# Patient Record
Sex: Female | Born: 1935 | Race: White | Hispanic: No | Smoking: Never smoker
Health system: Southern US, Community
[De-identification: ages and names within clinical notes are randomized; demographics above are authoritative.]

## PROBLEM LIST (undated history)

## (undated) DIAGNOSIS — I2510 Atherosclerotic heart disease of native coronary artery without angina pectoris: Secondary | ICD-10-CM

## (undated) DIAGNOSIS — I509 Heart failure, unspecified: Secondary | ICD-10-CM

## (undated) DIAGNOSIS — J45909 Unspecified asthma, uncomplicated: Secondary | ICD-10-CM

## (undated) DIAGNOSIS — E7800 Pure hypercholesterolemia, unspecified: Secondary | ICD-10-CM

## (undated) DIAGNOSIS — I447 Left bundle-branch block, unspecified: Secondary | ICD-10-CM

## (undated) DIAGNOSIS — R9389 Abnormal findings on diagnostic imaging of other specified body structures: Secondary | ICD-10-CM

## (undated) DIAGNOSIS — Z8719 Personal history of other diseases of the digestive system: Secondary | ICD-10-CM

## (undated) DIAGNOSIS — K219 Gastro-esophageal reflux disease without esophagitis: Secondary | ICD-10-CM

## (undated) DIAGNOSIS — M545 Low back pain, unspecified: Secondary | ICD-10-CM

## (undated) DIAGNOSIS — M810 Age-related osteoporosis without current pathological fracture: Secondary | ICD-10-CM

## (undated) DIAGNOSIS — I10 Essential (primary) hypertension: Secondary | ICD-10-CM

## (undated) DIAGNOSIS — G8929 Other chronic pain: Secondary | ICD-10-CM

## (undated) DIAGNOSIS — M1990 Unspecified osteoarthritis, unspecified site: Secondary | ICD-10-CM

## (undated) DIAGNOSIS — E039 Hypothyroidism, unspecified: Secondary | ICD-10-CM

## (undated) DIAGNOSIS — I872 Venous insufficiency (chronic) (peripheral): Secondary | ICD-10-CM

## (undated) DIAGNOSIS — I5030 Unspecified diastolic (congestive) heart failure: Secondary | ICD-10-CM

## (undated) DIAGNOSIS — N189 Chronic kidney disease, unspecified: Secondary | ICD-10-CM

## (undated) DIAGNOSIS — K5790 Diverticulosis of intestine, part unspecified, without perforation or abscess without bleeding: Secondary | ICD-10-CM

## (undated) DIAGNOSIS — J449 Chronic obstructive pulmonary disease, unspecified: Secondary | ICD-10-CM

## (undated) DIAGNOSIS — D509 Iron deficiency anemia, unspecified: Secondary | ICD-10-CM

## (undated) HISTORY — PX: CHOLECYSTECTOMY: SHX55

## (undated) HISTORY — PX: HAMMER TOE SURGERY: SHX385

## (undated) HISTORY — PX: CYSTOSCOPY KIDNEY W/ URETERAL GUIDE WIRE: SUR371

## (undated) HISTORY — PX: BUNIONECTOMY: SHX129

## (undated) HISTORY — PX: CARPAL TUNNEL RELEASE: SHX101

## (undated) HISTORY — PX: BREAST CYST EXCISION: SHX579

## (undated) HISTORY — PX: CATARACT EXTRACTION W/ INTRAOCULAR LENS  IMPLANT, BILATERAL: SHX1307

## (undated) HISTORY — PX: ABDOMINAL HYSTERECTOMY: SHX81

## (undated) HISTORY — PX: TOE SURGERY: SHX1073

---

## 1998-11-25 HISTORY — PX: TOTAL KNEE ARTHROPLASTY: SHX125

## 2012-06-25 HISTORY — PX: CORONARY ANGIOPLASTY WITH STENT PLACEMENT: SHX49

## 2012-07-22 ENCOUNTER — Other Ambulatory Visit: Payer: Self-pay | Admitting: Physician Assistant

## 2012-07-22 ENCOUNTER — Inpatient Hospital Stay (HOSPITAL_COMMUNITY)
Admission: AD | Admit: 2012-07-22 | Discharge: 2012-07-24 | DRG: 246 | Disposition: A | Discharge status: Home / Self Care | Payer: Medicare Other | Source: Other Acute Inpatient Hospital | Attending: Cardiology | Admitting: Cardiology

## 2012-07-22 ENCOUNTER — Encounter (HOSPITAL_COMMUNITY): Payer: Self-pay | Admitting: Pharmacy Technician

## 2012-07-22 ENCOUNTER — Encounter (HOSPITAL_COMMUNITY): Payer: Self-pay | Admitting: General Practice

## 2012-07-22 DIAGNOSIS — E039 Hypothyroidism, unspecified: Secondary | ICD-10-CM | POA: Diagnosis present

## 2012-07-22 DIAGNOSIS — I200 Unstable angina: Secondary | ICD-10-CM

## 2012-07-22 DIAGNOSIS — I2510 Atherosclerotic heart disease of native coronary artery without angina pectoris: Principal | ICD-10-CM | POA: Diagnosis present

## 2012-07-22 DIAGNOSIS — K5730 Diverticulosis of large intestine without perforation or abscess without bleeding: Secondary | ICD-10-CM | POA: Diagnosis present

## 2012-07-22 DIAGNOSIS — Z955 Presence of coronary angioplasty implant and graft: Secondary | ICD-10-CM

## 2012-07-22 DIAGNOSIS — R079 Chest pain, unspecified: Secondary | ICD-10-CM

## 2012-07-22 DIAGNOSIS — I509 Heart failure, unspecified: Secondary | ICD-10-CM | POA: Diagnosis present

## 2012-07-22 DIAGNOSIS — R0602 Shortness of breath: Secondary | ICD-10-CM

## 2012-07-22 DIAGNOSIS — D649 Anemia, unspecified: Secondary | ICD-10-CM | POA: Diagnosis present

## 2012-07-22 DIAGNOSIS — I5031 Acute diastolic (congestive) heart failure: Secondary | ICD-10-CM | POA: Diagnosis present

## 2012-07-22 DIAGNOSIS — G2581 Restless legs syndrome: Secondary | ICD-10-CM | POA: Diagnosis present

## 2012-07-22 DIAGNOSIS — I10 Essential (primary) hypertension: Secondary | ICD-10-CM | POA: Diagnosis present

## 2012-07-22 HISTORY — DX: Gastro-esophageal reflux disease without esophagitis: K21.9

## 2012-07-22 HISTORY — DX: Diverticulosis of intestine, part unspecified, without perforation or abscess without bleeding: K57.90

## 2012-07-22 HISTORY — DX: Personal history of other diseases of the digestive system: Z87.19

## 2012-07-22 HISTORY — DX: Essential (primary) hypertension: I10

## 2012-07-22 HISTORY — DX: Atherosclerotic heart disease of native coronary artery without angina pectoris: I25.10

## 2012-07-22 HISTORY — DX: Hypothyroidism, unspecified: E03.9

## 2012-07-22 HISTORY — DX: Unspecified diastolic (congestive) heart failure: I50.30

## 2012-07-22 HISTORY — DX: Unspecified osteoarthritis, unspecified site: M19.90

## 2012-07-22 HISTORY — DX: Abnormal findings on diagnostic imaging of other specified body structures: R93.89

## 2012-07-22 LAB — HEPARIN LEVEL (UNFRACTIONATED): Heparin Unfractionated: 0.55 IU/mL (ref 0.30–0.70)

## 2012-07-22 MED ORDER — ASPIRIN EC 81 MG PO TBEC: 81.0000 mg | DELAYED_RELEASE_TABLET | Freq: Every day | ORAL | Status: DC

## 2012-07-22 MED ORDER — LOPERAMIDE HCL 1 MG/5ML PO LIQD
1.0000 mg | Freq: Every day | ORAL | Status: DC | PRN
  Filled 2012-07-22: qty 5

## 2012-07-22 MED ORDER — ASPIRIN EC 81 MG PO TBEC
81.0000 mg | DELAYED_RELEASE_TABLET | Freq: Every day | ORAL | Status: DC
  Filled 2012-07-22: qty 1

## 2012-07-22 MED ORDER — GABAPENTIN 100 MG PO CAPS
100.0000 mg | ORAL_CAPSULE | Freq: Every day | ORAL | Status: DC
  Administered 2012-07-22 – 2012-07-23 (×2): 100 mg via ORAL
  Filled 2012-07-22 (×3): qty 1

## 2012-07-22 MED ORDER — ASPIRIN 81 MG PO CHEW
324.0000 mg | CHEWABLE_TABLET | Freq: Once | ORAL | Status: AC
  Administered 2012-07-23: 324 mg via ORAL
  Filled 2012-07-22: qty 4

## 2012-07-22 MED ORDER — AMLODIPINE BESYLATE 5 MG PO TABS
5.0000 mg | ORAL_TABLET | Freq: Every day | ORAL | Status: DC
  Administered 2012-07-22: 5 mg via ORAL
  Filled 2012-07-22 (×3): qty 1

## 2012-07-22 MED ORDER — HYDROCODONE-ACETAMINOPHEN 7.5-325 MG PO TABS
1.0000 | ORAL_TABLET | Freq: Four times a day (QID) | ORAL | Status: DC | PRN
  Administered 2012-07-22 – 2012-07-23 (×2): 1 via ORAL
  Filled 2012-07-22 (×2): qty 1

## 2012-07-22 MED ORDER — FERROUS SULFATE 325 (65 FE) MG PO TABS
325.0000 mg | ORAL_TABLET | Freq: Every day | ORAL | Status: DC
  Administered 2012-07-22: 325 mg via ORAL
  Filled 2012-07-22 (×3): qty 1

## 2012-07-22 MED ORDER — SODIUM CHLORIDE 0.9 % IV SOLN: INTRAVENOUS | Status: DC

## 2012-07-22 MED ORDER — ONDANSETRON HCL 4 MG/2ML IJ SOLN: 4.0000 mg | Freq: Four times a day (QID) | INTRAMUSCULAR | Status: DC | PRN

## 2012-07-22 MED ORDER — HEPARIN BOLUS VIA INFUSION
4000.0000 [IU] | Freq: Once | INTRAVENOUS | Status: AC
  Administered 2012-07-22: 4000 [IU] via INTRAVENOUS
  Filled 2012-07-22: qty 4000

## 2012-07-22 MED ORDER — BENAZEPRIL HCL 20 MG PO TABS
20.0000 mg | ORAL_TABLET | Freq: Every day | ORAL | Status: DC
  Administered 2012-07-22: 20 mg via ORAL
  Filled 2012-07-22 (×3): qty 1

## 2012-07-22 MED ORDER — NITROGLYCERIN 2 % TD OINT
1.0000 [in_us] | TOPICAL_OINTMENT | Freq: Four times a day (QID) | TRANSDERMAL | Status: DC
  Administered 2012-07-22 – 2012-07-23 (×2): 1 [in_us] via TOPICAL
  Filled 2012-07-22: qty 30

## 2012-07-22 MED ORDER — VITAMIN B-12 1000 MCG PO TABS
1000.0000 ug | ORAL_TABLET | Freq: Every day | ORAL | Status: DC
  Administered 2012-07-22: 1000 ug via ORAL
  Filled 2012-07-22 (×3): qty 1

## 2012-07-22 MED ORDER — ACETAMINOPHEN 325 MG PO TABS: 650.0000 mg | ORAL_TABLET | ORAL | Status: DC | PRN

## 2012-07-22 MED ORDER — HEPARIN (PORCINE) IN NACL 100-0.45 UNIT/ML-% IJ SOLN
800.0000 [IU]/h | INTRAMUSCULAR | Status: DC
  Administered 2012-07-22: 800 [IU]/h via INTRAVENOUS
  Filled 2012-07-22 (×2): qty 250

## 2012-07-22 MED ORDER — LEVOTHYROXINE SODIUM 75 MCG PO TABS
75.0000 ug | ORAL_TABLET | Freq: Every day | ORAL | Status: DC
  Administered 2012-07-22: 75 ug via ORAL
  Filled 2012-07-22 (×3): qty 1

## 2012-07-22 MED ORDER — NITROGLYCERIN 0.4 MG SL SUBL: 0.4000 mg | SUBLINGUAL_TABLET | SUBLINGUAL | Status: DC | PRN

## 2012-07-22 MED ORDER — CYCLOSPORINE 0.05 % OP EMUL
1.0000 [drp] | Freq: Two times a day (BID) | OPHTHALMIC | Status: DC
  Administered 2012-07-22 – 2012-07-23 (×3): 1 [drp] via OPHTHALMIC
  Filled 2012-07-22 (×6): qty 1

## 2012-07-22 MED ORDER — ROPINIROLE HCL 1 MG PO TABS
2.0000 mg | ORAL_TABLET | Freq: Every day | ORAL | Status: DC
  Administered 2012-07-22 – 2012-07-23 (×2): 2 mg via ORAL
  Filled 2012-07-22 (×3): qty 2

## 2012-07-22 MED ORDER — PANTOPRAZOLE SODIUM 40 MG PO TBEC
40.0000 mg | DELAYED_RELEASE_TABLET | Freq: Every day | ORAL | Status: DC
  Administered 2012-07-23: 40 mg via ORAL
  Filled 2012-07-22: qty 1

## 2012-07-22 MED ORDER — SODIUM CHLORIDE 0.9 % IV SOLN
INTRAVENOUS | Status: DC
  Administered 2012-07-22: 14:00:00 via INTRAVENOUS

## 2012-07-22 MED ORDER — PSYLLIUM 95 % PO PACK
1.0000 | PACK | Freq: Two times a day (BID) | ORAL | Status: DC
  Administered 2012-07-22 – 2012-07-23 (×2): 1 via ORAL
  Filled 2012-07-22 (×6): qty 1

## 2012-07-22 NOTE — Consult Note (Signed)
ANTICOAGULATION CONSULT NOTE - Follow up  Pharmacy Consult for Heparin Indication: chest pain/ACS  Allergies  Allergen Reactions  . Sulfa Antibiotics   . Sulfa Drugs Cross Reactors     Patient Measurements: Height: 5\' 3"  (160 cm) Weight: 148 lb 14.4 oz (67.541 kg) IBW/kg (Calculated) : 52.4  Heparin Dosing Weight: 66kg  Labs (from [facility]) : sCr 1.06 Hgb 13.4 Plt 227  Assessment: 85yof with hx HTN/HLD presented to [facility] with exertional CP and dyspnea. She was started on a heparin drip around 1200 at 880 units/hr and then transferred to [facility] for cardiac cath. Heparin drip was actually clamped off when she got to [facility]. Since it has been off for about an hour, will need to re-dose. Plan is for cath either this afternoon or tomorrow. Labs from [place] are wnl. Initial heparin level therapeutic @0 .55 units/ml.  Goal of Therapy:  Heparin level 0.3-0.7 units/ml Monitor platelets by anticoagulation protocol: Yes   Plan:  1)  Continue Heparin gtt @ 800 units/hr 2) Daily heparin level and CBC  Ana Pena 07/22/2012,11:45 PM

## 2012-07-22 NOTE — CV Procedure (Signed)
Conclusions: 1. The estimated ejection fraction is 60-65%. 2. Abnormal left ventricular diastolic filling is observed, consistent with impaired relaxation. 3. The left atrial chamber size is normal. 4. The right ventricular global systolic function is normal. 5. There is a trace of aortic regurgitation. 6. There is no evidence of mitral regurgitation. 7. There is a trace tricuspid regurgitation. 8. The inferior vena cava appears normal in size. 9. There is a greater than 50% respiratory change in the inferior vena cava dimension.  Alvin Critchley Gent 1:25 PM 07/22/2012

## 2012-07-22 NOTE — Consult Note (Signed)
ANTICOAGULATION CONSULT NOTE - Initial Consult  Pharmacy Consult for Heparin Indication: chest pain/ACS  Allergies  Allergen Reactions  . Sulfa Antibiotics   . Sulfa Drugs Cross Reactors     Patient Measurements: Height: 5\' 3"  (160 cm) Weight: 148 lb 14.4 oz (67.541 kg) IBW/kg (Calculated) : 52.4  Heparin Dosing Weight: 66kg  Labs (from [facility]) : sCr 1.06 Hgb 13.4 Plt 227  Assessment: 85yof with hx HTN/HLD presented to [facility] with exertional CP and dyspnea. She was started on a heparin drip around 1200 at 880 units/hr and then transferred to [facility] for cardiac cath. Heparin drip was actually clamped off when she got to [facility]. Since it has been off for about an hour, will need to re-dose. Plan is for cath either this afternoon or tomorrow. Labs from [place] are wnl.  Goal of Therapy:  Heparin level 0.3-0.7 units/ml Monitor platelets by anticoagulation protocol: Yes   Plan:  1) Heparin bolus 4000 units x 1 2) Heparin gtt @ 800 units/hr 3) 8h heparin level vs follow up after cath 4) Daily heparin level and CBC  Fredrik Rigger 07/22/2012,1:56 PM

## 2012-07-22 NOTE — Progress Notes (Signed)
Late entry. Earlier (~6pm) received word that patient's cath was deferred until tomorrow due to scheduling. I discussed the change with the patient at that time and offered our apologies for rescheduling the procedure. She was appreciative of the update. She was informed that her case is tentatively rescheduled for 7:30 tomorrow morning. She did not have any questions about the cath. Discussed plan with Shanda Bumps, RN - changed to NSL with plans to resume IVF in AM, gave diet tray tonight and keep NPO after midnight.  Dayna Dunn PA-C

## 2012-07-22 NOTE — H&P (Signed)
NAME:  Ana Pena, Ana Pena ROOM: 228  UNIT NUMBER:  [id number] LOCATION: 12F 228 01 ADM/VISIT DATE:  07/21/2012   ADM Ana BrownerKathaleen Grinder:  [id number] DOB: [birth year]   PRIMARY CARE PHYSICIAN:  Doreen Beam, M.D.  CURRENT COMPLAINTS:  Chest pain and shortness of breath on exertion.  HISTORY OF PRESENT ILLNESS:  The patient is a very pleasant 78 year old female with no significant past medical history, short of treated hypertension.  The patient was in her usual state of health until approximately 2 weeks ago.  According to the daughter and her husband, who is my patient, she has been always very active and has had no limitations in her activities or exercise tolerance.  However, 2 weeks ago she started developing episodes of substernal chest pressure with some radiation into the neck, quickly followed by shortness of breath.  The episodes always occurred on exertion, but appeared to become more frequent over the last 2 weeks.  She stated that walking up a flight of stairs at her home causes chest pressure associated with shortness of breath.  She then has to sit down and rest and her symptoms disappear.  She has not tried any nitroglycerin.  Again according to the daughter, these symptoms have become quite frequent, and they have noticed that during activities of daily living the patient has to stop frequently, sit down and rest.  She also reports symptoms of orthopnea and PND over the last 2 weeks.  She has had difficulty sleeping and has to prop herself up on several pillows.  In addition, also allows noticed ankle edema during this period.  She was seen by Dr. Sherril Croon for evaluation of her symptoms, who admitted her to the hospital.  She has ruled out for myocardial infarction in the interim, and her EKG on admission showed no acute ischemic changes or prior infarct pattern.  She did have a left anterior hemiblock.  Troponins have remained negative.  She did have a positive D-dimer at 0.06, and a CT scan was performed  which showed no evidence of pulmonary embolism.  There was some question of a cyst in the kidney.  There was also cardiomegaly noted on the CT scan.  After admission the patient received Lasix and she reported that her breathing is much improved, and her lower extremity edema has resolved.  Notable on admission, her BNP level was 69, but this was already after she received Lasix.  ALLERGIES:  SULFA.  But no known IVP dye reaction; patient tolerated the CTA without any difficulty.  SOCIAL HISTORY:  The patient lives with her husband, who is my patient.  She does not smoke or drink.  She is retired.  FAMILY HISTORY:  Noncontributory.  HOME MEDICATIONS: 1. Diclofenac 50 mg twice daily. 2. Fosamax 70 mg weekly. 3. Imodium 1 mg daily. 4. Levothyroxine 75 mcg p.o. daily. 5. Lorcet 7.5/650 one tablet q.6h. as needed. 6. Lotrel 5/20 mg daily. 7. Metamucil, ispaghula husk, 2 tablets orally twice daily. 8. Neurontin 100 mg p.o. daily at bedtime. 9. Protonix 40 mg p.o. daily. 10. Requip 0.5 mg at bedtime. 11. Restasis eyedrops 0.05% two drops daily. 12. Slow Iron release 45 mg. 13. Ferrous sulfate 325 mg daily. 14. Vitamin B12 1000 mcg orally daily.  PAST MEDICAL HISTORY: 1. Hypertension. 2. Hypothyroidism. 3. Restless leg syndrome. 4. Constipation.  REVIEW OF SYSTEMS:  Pertinent positives as listed below.  Patient reports constipation.  Positive for lower extremity edema.  No nausea or vomiting.  No fever or  chills.  No claudication.  The remainder of the 18-point review of systems is otherwise within normal limits.  PHYSICAL EXAMINATION:  Vital signs:  Blood pressure 151/73, heart rate 74, respirations 20, temperature 98, saturation 95%.  General:  A well-nourished white female in no apparent distress.  HEENT:  Pupils are isochoric, conjunctivae clear.  Oropharynx is clear, no evidence of gingivitis.  Normal carotid upstroke and no carotid bruits.  No thyromegaly, no nodular thyroid.  JVP is  approximately 6 cm in a 45 degree angle.  Lungs:  Clear breath sounds bilaterally without any wheezing or crackles.  Heart:  Regular rate and rhythm with normal S1, S2 and no pathological murmurs.  No pathological S3.  Abdomen:  Soft, nontender with no rebound or guarding, and good bowel sounds.  There is no hepatomegaly on palpation.  Urogenital:  Exam was deferred.  Extremities:  Exam reveals no edema.  Skin is somewhat dry and tented.  There is no cyanosis or clubbing.  Vascular:  There are 2+ femoral pulses bilaterally with no bruits.  Normal dorsalis pedis and posterior tibial pulses bilaterally.  Neurologic:  The patient is alert and oriented and grossly nonfocal.  Psychologic:  Normal affect.  LABORATORY WORK:  CBC is essentially within normal limits with a hemoglobin of 13.4, MCV 91.3, platelet count 227,000 and white count of 6500.  BMET is notable for a creatinine of 1.06, BUN 21 and potassium of 3.8.  Sodium is 139, CO2 is 24.  Liver function tests are within normal limits.  BNP level was normal, as outlined above.  Two sets of troponins are less than 2.01 respectively.  PROBLEM LIST: 1. Unstable angina. a. Rule out ischemic heart disease (likely). b. Ruled out for pulmonary embolism.  Negative CT scan. 2. Congestive heart failure symptoms. a. Systolic versus diastolic dysfunction, suspect the latter. b. Cardiomegaly by CT scan. c. Two-D echocardiogram with diastolic parameters performed in [place], and results pending. 3. Hypertension.  Controlled. 4. Hypothyroidism.  PLAN: 1. The patient presents with symptoms consistent with acute coronary syndrome.  She has exertional chest pain and dyspnea.  I suspect she has coronary artery disease, and I have given the patient the option to treat her medically versus an immediate invasive workup.  After talking with her husband and her daughter, as well as the patient, they feel they want to proceed with diagnostic cardiac catheterization to rule out  ischemic heart disease. 2. The patient also presented with heart failure symptoms, but this may well be related to her ischemic heart disease.  An echocardiogram is required to rule out diastolic dysfunction versus systolic dysfunction.  I could not auscultate any significant valvular lesions, but further evaluation of the echocardiogram is also indicated. 3. I discussed the risks and benefits of a cardiac catheterization with the patient and she is willing to proceed.  We will continue her home medications, but will hold Diclofenac, and we will also start her on nitro paste and intravenous heparin in the meanwhile.  It is not entirely clear if she will have a cardiac catheterization this afternoon, but she will be kept n.p.o. for lunch.  If her catheterization is tomorrow then the patient can have a diet today.  [facility] has been notified as well as [facility], and the patient will be transferred in the next few hours.   __________________________    Lewayne Bunting, M.D. Alinda Money D: 07/22/2012 1012 T: 07/22/2012 1048 P: DEG  cc:  Doreen Beam, M.D.  Alvin Critchley [facility] 07/22/2012  1:25 PM

## 2012-07-23 ENCOUNTER — Ambulatory Visit (HOSPITAL_COMMUNITY): Admit: 2012-07-23 | Payer: Medicare Other | Admitting: Cardiovascular Disease

## 2012-07-23 ENCOUNTER — Encounter (HOSPITAL_COMMUNITY)
Admission: AD | Disposition: A | Discharge status: Home / Self Care | Payer: Self-pay | Source: Other Acute Inpatient Hospital | Attending: Cardiology

## 2012-07-23 DIAGNOSIS — I200 Unstable angina: Secondary | ICD-10-CM

## 2012-07-23 DIAGNOSIS — I2510 Atherosclerotic heart disease of native coronary artery without angina pectoris: Secondary | ICD-10-CM

## 2012-07-23 HISTORY — PX: PERCUTANEOUS CORONARY STENT INTERVENTION (PCI-S): SHX5485

## 2012-07-23 HISTORY — PX: LEFT HEART CATHETERIZATION WITH CORONARY ANGIOGRAM: SHX5451

## 2012-07-23 LAB — BASIC METABOLIC PANEL
CO2: 26 mEq/L (ref 19–32)
Chloride: 100 mEq/L (ref 96–112)
Glucose, Bld: 115 mg/dL — ABNORMAL HIGH (ref 70–99)
Sodium: 136 mEq/L (ref 135–145)

## 2012-07-23 LAB — CBC
HCT: 35.1 % — ABNORMAL LOW (ref 36.0–46.0)
Hemoglobin: 11.7 g/dL — ABNORMAL LOW (ref 12.0–15.0)
MCH: 29.5 pg (ref 26.0–34.0)
MCV: 88.4 fL (ref 78.0–100.0)
RBC: 3.97 MIL/uL (ref 3.87–5.11)
WBC: 7.1 10*3/uL (ref 4.0–10.5)

## 2012-07-23 LAB — PROTIME-INR: INR: 1.07 (ref 0.00–1.49)

## 2012-07-23 SURGERY — LEFT HEART CATHETERIZATION WITH CORONARY ANGIOGRAM
Anesthesia: LOCAL

## 2012-07-23 MED ORDER — ATORVASTATIN CALCIUM 20 MG PO TABS
20.0000 mg | ORAL_TABLET | Freq: Every day | ORAL | Status: DC
  Administered 2012-07-23: 20 mg via ORAL
  Filled 2012-07-23 (×2): qty 1

## 2012-07-23 MED ORDER — NITROGLYCERIN 0.2 MG/ML ON CALL CATH LAB
INTRAVENOUS | Status: AC
  Filled 2012-07-23: qty 1

## 2012-07-23 MED ORDER — CLOPIDOGREL BISULFATE 75 MG PO TABS
75.0000 mg | ORAL_TABLET | Freq: Every day | ORAL | Status: DC
  Filled 2012-07-23: qty 1

## 2012-07-23 MED ORDER — SODIUM CHLORIDE 0.9 % IV SOLN: INTRAVENOUS | Status: AC

## 2012-07-23 MED ORDER — FENTANYL CITRATE 0.05 MG/ML IJ SOLN
INTRAMUSCULAR | Status: AC
  Filled 2012-07-23: qty 2

## 2012-07-23 MED ORDER — LIDOCAINE HCL (PF) 1 % IJ SOLN
INTRAMUSCULAR | Status: AC
Start: 2012-07-23 — End: 2012-07-23
  Filled 2012-07-23: qty 30

## 2012-07-23 MED ORDER — ADENOSINE 12 MG/4ML IV SOLN
12.0000 mL | Freq: Once | INTRAVENOUS | Status: DC
  Filled 2012-07-23: qty 12

## 2012-07-23 MED ORDER — HEPARIN (PORCINE) IN NACL 2-0.9 UNIT/ML-% IJ SOLN
INTRAMUSCULAR | Status: AC
  Filled 2012-07-23: qty 2000

## 2012-07-23 MED ORDER — BIVALIRUDIN 250 MG IV SOLR
INTRAVENOUS | Status: AC
  Filled 2012-07-23: qty 250

## 2012-07-23 MED ORDER — CLOPIDOGREL BISULFATE 300 MG PO TABS
ORAL_TABLET | ORAL | Status: AC
  Filled 2012-07-23: qty 2

## 2012-07-23 MED ORDER — MIDAZOLAM HCL 2 MG/2ML IJ SOLN
INTRAMUSCULAR | Status: AC
  Filled 2012-07-23: qty 2

## 2012-07-23 MED ORDER — FAMOTIDINE IN NACL 20-0.9 MG/50ML-% IV SOLN
INTRAVENOUS | Status: AC
  Filled 2012-07-23: qty 50

## 2012-07-23 MED FILL — Heparin Sodium (Porcine) 100 Unt/ML in Sodium Chloride 0.45%: INTRAMUSCULAR | Qty: 250 | Status: AC

## 2012-07-23 NOTE — Interval H&P Note (Signed)
History and Physical Interval Note:  07/23/2012 8:30 AM  Ana Pena  has presented today for cardiac cath  with the diagnosis of Chest pain  The various methods of treatment have been discussed with the patient and family. After consideration of risks, benefits and other options for treatment, the patient has consented to  Procedure(s) (LRB): LEFT HEART CATHETERIZATION WITH CORONARY ANGIOGRAM (N/A) as a surgical intervention .  The patient's history has been reviewed, patient examined, no change in status, stable for surgery.  I have reviewed the patient's chart and labs.  Questions were answered to the patient's satisfaction.     MCALHANY,CHRISTOPHER

## 2012-07-23 NOTE — CV Procedure (Signed)
   Cardiac Catheterization Operative Report  Mada Sadik [id number] 8/29/20139:18 AM VYAS,DHRUV B., MD  Procedure Performed:  1. Left Heart Catheterization 2. Selective Coronary Angiography 3. FFR LAD 4. PTCA/DES x 1 mid LAD 5. Angioseal RFA  Operator: Verne Carrow, MD  Indication: Unstable angina.                                       Procedure Details: The risks, benefits, complications, treatment options, and expected outcomes were discussed with the patient. The patient and/or family concurred with the proposed plan, giving informed consent. The patient was brought to the cath lab after IV hydration was begun and oral premedication was given. The patient was further sedated with Versed and Fentanyl. The right groin was prepped and draped in the usual manner. Using the modified Seldinger access technique, a 5 French sheath was placed in the right femoral artery. Standard diagnostic catheters were used to perform selective coronary angiography. A pigtail catheter was used to measure LV pressures. The patient was found to have a 70-80% mid LAD stenosis. I elected to perform a FFR. I upsized the sheath to a 6 [place] system. I then engaged the left main with a XB LAD 3.5 guiding catheter. She was given a bolus of Angiomax and a drip was started. When the ACT was greater than 200, I passed a pressure wire into the mid LAD. IV adenosine was started. Baseline FFR 0.90. FFR with infusion of adenosine was 0.74. I then used a 2.5 x 15 mm balloon to pre-dilate the lesion in the proximal to mid LAD x 2. I then deployed a 2.75 x 28 mm Promus Element DES in the proximal to mid LAD. This was post-dilated with a 2.75 x 20 mm Repton balloon x 2. There was an excellent result. The stenosis was taken from 80% down to 0%. An Angioseal was placed in the right femoral artery.   There were no immediate complications. The patient was taken to the recovery area in stable condition.   Hemodynamic  Findings: Central aortic pressure: 148/69 Left ventricular pressure: 143/5/9  Angiographic Findings:  Left main: No obstructive disease.   Left Anterior Descending Artery: Large caliber vessel that courses to the apex. The mid vessel has a 80% short stenosis with 50% plaque just beyond the tight stenosis. The diagonal is patent. The mid to distal vessel has another focal 50% smooth stenosis.   Circumflex Artery: Moderate sized vessel with proximal plaque, 40% smooth stenosis in the mid vessel. The first OM is small and patent.   Right Coronary Artery: Large, dominant vessel with mild plaque disease.   Left Ventricular Angiogram: Deferred.   Impression: 1. 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   Recommendations:  She will need ASA and Plavix for one year. Statin/beta blocker. D/C home in am.        Complications:  None. The patient tolerated the procedure well.

## 2012-07-23 NOTE — Progress Notes (Signed)
Received verbal order from Dr. Charm Barges for pt to travel off of telemetry to cath lab.

## 2012-07-24 ENCOUNTER — Encounter (HOSPITAL_COMMUNITY): Payer: Self-pay | Admitting: Physician Assistant

## 2012-07-24 LAB — BASIC METABOLIC PANEL
BUN: 21 mg/dL (ref 6–23)
CO2: 25 mEq/L (ref 19–32)
Calcium: 8.9 mg/dL (ref 8.4–10.5)
Glucose, Bld: 100 mg/dL — ABNORMAL HIGH (ref 70–99)
Sodium: 137 mEq/L (ref 135–145)

## 2012-07-24 LAB — CBC
HCT: 32.8 % — ABNORMAL LOW (ref 36.0–46.0)
Hemoglobin: 11 g/dL — ABNORMAL LOW (ref 12.0–15.0)
MCH: 30.1 pg (ref 26.0–34.0)
RBC: 3.66 MIL/uL — ABNORMAL LOW (ref 3.87–5.11)

## 2012-07-24 MED ORDER — CLOPIDOGREL BISULFATE 75 MG PO TABS: 75.0000 mg | ORAL_TABLET | Freq: Every day | ORAL | Status: DC

## 2012-07-24 MED ORDER — LOPERAMIDE HCL 2 MG PO TABS: 1.0000 mg | ORAL_TABLET | Freq: Every day | ORAL | Status: DC | PRN

## 2012-07-24 MED ORDER — NITROGLYCERIN 0.4 MG SL SUBL: 0.4000 mg | SUBLINGUAL_TABLET | SUBLINGUAL | Status: DC | PRN

## 2012-07-24 MED ORDER — ATORVASTATIN CALCIUM 20 MG PO TABS: 20.0000 mg | ORAL_TABLET | Freq: Every day | ORAL | Status: DC

## 2012-07-24 MED ORDER — ASPIRIN 81 MG PO TBEC: 81.0000 mg | DELAYED_RELEASE_TABLET | Freq: Every day | ORAL | Status: AC

## 2012-07-24 MED FILL — Dextrose Inj 5%: INTRAVENOUS | Qty: 50 | Status: AC

## 2012-07-24 NOTE — Progress Notes (Signed)
CARDIAC REHAB PHASE I   PRE:  Rate/Rhythm: 66 SR  BP:  Supine:   Sitting: 175/71  Standing:    SaO2:   MODE:  Ambulation: 1000 ft   POST:  Rate/Rhythem: 102 ST  BP:  Supine:   Sitting: 149/73  Standing:    SaO2:  0800-0900 Tolerated ambulation well without c/o of cp.Gait steady. BP improved after walk. Completed discharge education with pt, husband and son. Pt agrees to Outpt CRP in [place], will send referral.  Ana Pena

## 2012-07-24 NOTE — Discharge Summary (Signed)
Discharge Summary   Patient ID: Ana Pena MRN: [id number], DOB/AGE: 1926/09/25 78 y.o. Admit date: 07/22/2012 D/C date:     07/24/2012  Primary Cardiologist: DeGent  Primary Discharge Diagnoses:  1. Unstable angina with newly diagnosed CAD s/p PTCA/DES to LAD 07/23/12 - initiated on statin thus may need OP f/u labs 2. Acute diastolic CHF 3. HTN 4. Hypothyroidism 5. Question of possible renal cyst in RUQ by CT scan this admission - will be instructed to f/u PCP 6. Renal insufficiency by labs this admission - Cr 1.27 7. Mild anemia by labs this admission - Hgb 11.0 at discharge (hx of anemia on iron)  Secondary Discharge Diagnoses:  1. Restless leg syndrome 2. GERD 3. H/o hiatal hernia 4. Arthritis 5. Diverticulosis  Hospital Course: Ana Pena is an 78 y/o F with hx of HTN, hypothyroidism without prior history of CAD. She presented to [facility] complaining of substernal chest pressure with radiation to her neck fololwed by shortness of breath, more frequent over the last 2 weeks. The episodes have occurred with exertion and have been relieved with rest, progressing to the point where ADLs fatigue her. She also reported orthopnea and PND for the last 2 weeks. She did have a positive D-dimer at 0.06, and a CT scan was performed which showed no evidence of pulmonary embolism. There was some question of a cyst in the kidney on CT scan for which we discussed that she should follow up with PCP for further evaluation. There was also cardiomegaly noted on the CT scan. After admission the patient received Lasix with improvement in LEE and SOB. Troponins were negative at [facility]. EKG showed no acute ischemic changes or prior infarct pattern, did have a left anterior hemiblock. Her symptoms were felt concerning for unstable angina and she was transferred to [facility] for cardiac catheterization. 2D echo was performed prior to transfer at [facility] showing EF 60-65%, tr AI/TR,  abnormal LV diastolic filling consistent with impaired relaxation. She underwent cath 07/23/12 demonstrating double vessel CAD with severe stenosis in the LAD s/p PTCA/DES x 1. She had otherwise nonobstructive disease in the mid-distal LAD and Cx for med rx. She will need ASA/Plavix for 1 year. The patient tolerated the procedure well. Initially plan was to add BB but per discussion with Dr. Riley Kill, will hold off. HR is generally in the mid 60s. She ambulated with cardiac rehab without any difficulty. Dr. Riley Kill has seen and examined the patient and feels she is stable for discharge.  She was instructed to follow up with her primary care doctor for a few issues noted at discharge: 1) question of renal cyst on CT scan, 2) mild renal insufficiency (Cr 1.27), anemia (Hgb 11 at discharge). I discussed these results with her and her family.  Discharge Vitals: Blood pressure 175/71, pulse 71, temperature 97.5 F (36.4 C), temperature source Oral, resp. rate 15, height 5\' 3"  (1.6 m), weight 148 lb 14.4 oz (67.541 kg), SpO2 98.00%.  Labs: Lab Results  Component Value Date   WBC 6.6 07/24/2012   HGB 11.0* 07/24/2012   HCT 32.8* 07/24/2012   MCV 89.6 07/24/2012   PLT 193 07/24/2012    Lab 07/24/12 0500  NA 137  K 3.8  CL 102  CO2 25  BUN 21  CREATININE 1.27*  CALCIUM 8.9  PROT --  BILITOT --  ALKPHOS --  ALT --  AST --  GLUCOSE 100*    Basename 07/23/12 0147 07/22/12 1910 07/22/12 1351  CKTOTAL -- -- --  CKMB -- -- --  TROPONINI <0.30 <0.30 <0.30    Diagnostic Studies/Procedures   Cardiac catheterization this admission, please see full report and above for summary. Also had CT scan at [facility] - please refer to H&P for discussion.  Discharge Medications   Current Discharge Medication List    START taking these medications   Details  aspirin EC 81 MG EC tablet Take 1 tablet (81 mg total) by mouth daily.   Associated Diagnoses: Unstable angina    atorvastatin (LIPITOR) 20  MG tablet Take 1 tablet (20 mg total) by mouth at bedtime. Qty: 30 tablet, Refills: 6    clopidogrel (PLAVIX) 75 MG tablet Take 1 tablet (75 mg total) by mouth daily with breakfast. Qty: 30 tablet, Refills: 11    nitroGLYCERIN (NITROSTAT) 0.4 MG SL tablet Place 1 tablet (0.4 mg total) under the tongue every 5 (five) minutes as needed for chest pain (up to 3 doses). Qty: 25 tablet, Refills: 4   Associated Diagnoses: Unstable angina      CONTINUE these medications which have CHANGED   Details  loperamide (IMODIUM A-D) 2 MG tablet Take 0.5 tablets (1 mg total) by mouth daily as needed for diarrhea or loose stools.      CONTINUE these medications which have NOT CHANGED   Details  alendronate (FOSAMAX) 70 MG tablet Take 70 mg by mouth every 7 (seven) days. Take with a full glass of water on an empty stomach.    amLODipine-benazepril (LOTREL) 5-20 MG per capsule Take 1 capsule by mouth daily.    cycloSPORINE (RESTASIS) 0.05 % ophthalmic emulsion Place 1 drop into both eyes 2 (two) times daily.    ferrous sulfate 325 (65 FE) MG tablet Take 325 mg by mouth daily with breakfast.    Ferrous Sulfate Dried (SLOW RELEASE IRON) 45 MG TBCR Take 1 tablet by mouth daily.    gabapentin (NEURONTIN) 100 MG capsule Take 100 mg by mouth at bedtime.    hydrocodone-acetaminophen (LORCET PLUS) 7.5-650 MG per tablet Take 1 tablet by mouth every 6 (six) hours as needed. For pain.    levothyroxine (SYNTHROID, LEVOTHROID) 75 MCG tablet Take 75 mcg by mouth daily.    pantoprazole (PROTONIX) 40 MG tablet Take 40 mg by mouth daily.    psyllium (REGULOID) 0.52 G capsule Take 1.04 g by mouth 2 (two) times daily.    rOPINIRole (REQUIP) 0.5 MG tablet Take 0.5 mg by mouth at bedtime.    vitamin B-12 (CYANOCOBALAMIN) 1000 MCG tablet Take 1,000 mcg by mouth daily.      STOP taking these medications     diclofenac (VOLTAREN) 50 MG EC tablet Comments:  Reason for Stopping:          Disposition   The  patient will be discharged in stable condition to home. Discharge Orders    Future Appointments: Provider: Department: Dept Phone: Center:   08/17/2012 2:00 PM June Leap, MD [facility] [phone] [facility]     Future Orders Please Complete By Expires   Amb Referral to Cardiac Rehabilitation      Comments:   Pt agrees to Outpt CRP in [place], will send referral.   Diet - low sodium heart healthy      Increase activity slowly      Comments:   No driving for 2 days. No lifting over 5 lbs for 1 week. No sexual activity for 1 week. Keep procedure site clean & dry. If you notice  increased pain, swelling, bleeding or pus, call/return!  You may shower, but no soaking baths/hot tubs/pools for 1 weeks.  Your heart ultrasound showed that your heart muscle does not relax normally this admission. This may make you more susceptible to weight gain from fluid retention, which can lead to symptoms that we call heart failure.  For patients with this condition, we give them these special instructions:  1. Follow a low-salt diet and watch your fluid intake. In general, you should not be taking in more than 2 liters of fluid per day. Some patients are restricted to less than 1.5 liters. This includes sources of water in foods like soup. 2. Weigh yourself on the same scale at same time of day and keep a log. 3. Call your doctor: (Anytime you feel any of the following symptoms)  - 3-4 pound weight gain in 1-2 days or 2 pounds overnight  - Shortness of breath, with or without a dry hacking cough  - Swelling in the hands, feet or stomach  - If you have to sleep on extra pillows at night in order to breathe   Discharge instructions      Comments:   Please follow up with your primary doctor for the following issues: 1. CT scan at [facility] raised question of possible kidney cyst and this may require an ultrasound. 2. Your kidney function was mildly impaired and may need to be followed. 3.  Your blood count showed you were mildly anemic and this may require further monitoring.     Follow-up Information    Follow up with VYAS,DHRUV B., MD. (See discharge instructions. Need to schedule appointment to follow up.)    Contact information:   [address] [place] [address] [phone]       Follow up with Peyton Bottoms, MD. (08/17/12 at 2pm)    Contact information:   [address] [place] [place] [address] [phone]            Duration of Discharge Encounter: Greater than 30 minutes including physician and PA time.  Signed, Isla Sabree PA-C 07/24/2012, 11:05 AM

## 2012-07-24 NOTE — Progress Notes (Signed)
Subjective:  Doing well.  No chest pain.  Plan is for dc  Meds reviewed.  Patient wants to go home.   Objective:  Vital Signs in the last 24 hours: Temp:  [97.5 F (36.4 C)-98.2 F (36.8 C)] 97.5 F (36.4 C) (08/30 0743) Pulse Rate:  [57-82] 71  (08/30 0743) Resp:  [0-22] 15  (08/30 0743) BP: (121-175)/(60-102) 175/71 mmHg (08/30 0743) SpO2:  [93 %-100 %] 98 % (08/30 0743)  Intake/Output from previous day:     Physical Exam: General: Well developed, well nourished, elderly woman in no acute distress. Head:  Normocephalic and atraumatic. Lungs: Clear to auscultation and percussion. Heart: Normal S1 and S2.  No murmur, rubs or gallops.  Pulses: Pulses normal in all 4 extremities. Extremities: No clubbing or cyanosis. No edema. Neurologic: Alert and oriented x 3.    Lab Results:  Basename 07/24/12 0500 07/23/12 0148  WBC 6.6 7.1  HGB 11.0* 11.7*  PLT 193 212    Basename 07/24/12 0500 07/23/12 0148  NA 137 136  K 3.8 3.8  CL 102 100  CO2 25 26  GLUCOSE 100* 115*  BUN 21 21  CREATININE 1.27* 1.24*    Basename 07/23/12 0147 07/22/12 1910  TROPONINI <0.30 <0.30   Hepatic Function Panel No results found for this basename: PROT,ALBUMIN,AST,ALT,ALKPHOS,BILITOT,BILIDIR,IBILI in the last 72 hours No results found for this basename: CHOL in the last 72 hours No results found for this basename: PROTIME in the last 72 hours  Imaging: No results found.  EKG:  NSR.  Left axis deviation  Assessment/Plan:  1.  CAD with USAP-- sP PCI of the LAD as noted. 2.  Diastolic CHF 3.  Hypothyroidism 4.  Hkypertension  Plan  1.  Home today 2.  RTC in one month       Shawnie Pons, MD, [facility], [facility] 07/24/2012, 9:30 AM

## 2012-07-31 ENCOUNTER — Ambulatory Visit (INDEPENDENT_AMBULATORY_CARE_PROVIDER_SITE_OTHER): Payer: Medicare Other | Admitting: Internal Medicine

## 2012-07-31 ENCOUNTER — Telehealth: Payer: Self-pay | Admitting: Cardiology

## 2012-07-31 ENCOUNTER — Encounter: Payer: Self-pay | Admitting: Internal Medicine

## 2012-07-31 VITALS — BP 150/80 | HR 72 | Ht 63.0 in | Wt 154.4 lb

## 2012-07-31 DIAGNOSIS — I10 Essential (primary) hypertension: Secondary | ICD-10-CM | POA: Insufficient documentation

## 2012-07-31 DIAGNOSIS — I5030 Unspecified diastolic (congestive) heart failure: Secondary | ICD-10-CM

## 2012-07-31 DIAGNOSIS — I509 Heart failure, unspecified: Secondary | ICD-10-CM

## 2012-07-31 DIAGNOSIS — I2510 Atherosclerotic heart disease of native coronary artery without angina pectoris: Secondary | ICD-10-CM | POA: Insufficient documentation

## 2012-07-31 NOTE — Progress Notes (Signed)
Patient Care Team: Ignatius Specking, MD as PCP - General (Internal Medicine)   HPI  Ana Pena is a 78 y.o. female Seen in followup for newly diagnosed coronary artery disease status post DES placement to the LAD 07/23/2012. THis hospitalization was complicated by acute heart failure. Ejection fraction of 60-65% prior to transfer.  She is doing quite well at this point. She denies chest pain or shortness of breath. She does have problems with edema which is chronic and is asymmetric left greater than right she is recently been started on steroids because of shoulder pain  Past Medical History  Diagnosis Date  . Hypertension   . Hypothyroidism   . GERD (gastroesophageal reflux disease)   . H/O hiatal hernia   . Arthritis   . Diverticulosis   . CAD (coronary artery disease)     a. Unstable angina with newly diagnosed CAD s/p PTCA/DES to LAD 07/23/12.  . Diastolic CHF     a. EF normal 02/980.  [name] Abnormal CT scan     a. Question of possible renal cyst in RUQ by CT scan 06/2012 at [facility]    Past Surgical History  Procedure Date  . Abdominal hysterectomy   . Cholecystectomy   . Total knee arthroplasty 2000    Current Outpatient Prescriptions  Medication Sig Dispense Refill  . alendronate (FOSAMAX) 70 MG tablet Take 70 mg by mouth every 7 (seven) days. Take with a full glass of water on an empty stomach.      [name] amLODipine-benazepril (LOTREL) 5-20 MG per capsule Take 1 capsule by mouth daily.      [name] aspirin EC 81 MG EC tablet Take 1 tablet (81 mg total) by mouth daily.      [name] atorvastatin (LIPITOR) 20 MG tablet Take 1 tablet (20 mg total) by mouth at bedtime.  30 tablet  6  . Cholecalciferol (VITAMIN D-3) 1000 UNITS CAPS Take 4,000 Units by mouth daily.      . clopidogrel (PLAVIX) 75 MG tablet Take 1 tablet (75 mg total) by mouth daily with breakfast.  30 tablet  11  . cycloSPORINE (RESTASIS) 0.05 % ophthalmic emulsion Place 1 drop into both eyes 2 (two) times daily.      . ferrous  sulfate 325 (65 FE) MG tablet Take 325 mg by mouth daily with breakfast.      . gabapentin (NEURONTIN) 100 MG capsule Take 100 mg by mouth at bedtime.      . hydrocodone-acetaminophen (LORCET PLUS) 7.5-650 MG per tablet Take 1 tablet by mouth every 6 (six) hours as needed. For pain.      [name] levothyroxine (SYNTHROID, LEVOTHROID) 75 MCG tablet Take 75 mcg by mouth daily.      [name] loperamide (IMODIUM A-D) 2 MG tablet Take 0.5 tablets (1 mg total) by mouth daily as needed for diarrhea or loose stools.      . nitroGLYCERIN (NITROSTAT) 0.4 MG SL tablet Place 1 tablet (0.4 mg total) under the tongue every 5 (five) minutes as needed for chest pain (up to 3 doses).  25 tablet  4  . pantoprazole (PROTONIX) 40 MG tablet Take 40 mg by mouth daily.      . predniSONE (DELTASONE) 10 MG tablet Take 10 mg by mouth 2 (two) times daily.      . psyllium (REGULOID) 0.52 G capsule Take 1.04 g by mouth 2 (two) times daily.      [name] rOPINIRole (REQUIP) 2 MG tablet Take 2 mg by mouth at  bedtime.      . vitamin B-12 (CYANOCOBALAMIN) 1000 MCG tablet Take 1,000 mcg by mouth daily.        Allergies  Allergen Reactions  . Sulfa Antibiotics   . Sulfa Drugs Cross Reactors     Review of Systems negative except from HPI and PMH  Physical Exam BP 150/80  Pulse 72  Ht 5\' 3"  (1.6 m)  Wt 154 lb 6.4 oz (70.035 kg)  BMI 27.35 kg/m2 Well developed and well nourished in no acute distress HENT normal E scleral and icterus clear Neck Supple JVP flat; carotids brisk and full Clear to ausculation Regular rate and rhythm, no murmurs gallops or rub Soft with active bowel sounds No clubbing cyanosis 1+R and 2 L Edema Alert and oriented, grossly normal motor and sensory function Skin Warm and Dry    Assessment and  Plan

## 2012-07-31 NOTE — Assessment & Plan Note (Signed)
Poorly controlled. It may be aggravated by the recent introduction of steroids as might her edema.  We will plan to have her increase her Lotrel 2 weeks prior to seeing Dr. Sherril Croon he can check her metabolic profile at that time. I've advised him of the side effects of amlodipine related to edema

## 2012-07-31 NOTE — Assessment & Plan Note (Signed)
Continue current meds 

## 2012-07-31 NOTE — Patient Instructions (Addendum)
Your physician recommends that you schedule a follow-up appointment in: 3-4 months.  Your physician recommends that you continue on your current medications as directed. Please refer to the Current Medication list given to you today. 2 weeks before your appointment with Dr. Sherril Croon, please increase your lotrel 5/20 mg to (2) capsules daily. Your physician recommends that you AVOID SODIUM FROM YOUR DIET.

## 2012-07-31 NOTE — Assessment & Plan Note (Signed)
We need to get a better handle on her HTN  Long discussion re salt which she ingests with relish, although she is trying to do better

## 2012-08-03 ENCOUNTER — Encounter: Payer: Medicare Other | Admitting: Internal Medicine

## 2012-08-13 NOTE — Discharge Summary (Signed)
See my progress note from today.  Patient is ready for discharge and will arrange follow up at [facility]

## 2012-08-17 ENCOUNTER — Encounter: Payer: Medicare Other | Admitting: Cardiology

## 2012-08-25 DIAGNOSIS — R079 Chest pain, unspecified: Secondary | ICD-10-CM

## 2012-08-26 DIAGNOSIS — R079 Chest pain, unspecified: Secondary | ICD-10-CM

## 2012-09-10 ENCOUNTER — Ambulatory Visit (INDEPENDENT_AMBULATORY_CARE_PROVIDER_SITE_OTHER): Payer: Medicare Other | Admitting: Physician Assistant

## 2012-09-10 ENCOUNTER — Encounter: Payer: Self-pay | Admitting: Physician Assistant

## 2012-09-10 VITALS — BP 133/69 | HR 54 | Ht 62.0 in | Wt 152.8 lb

## 2012-09-10 DIAGNOSIS — I2510 Atherosclerotic heart disease of native coronary artery without angina pectoris: Secondary | ICD-10-CM

## 2012-09-10 DIAGNOSIS — E785 Hyperlipidemia, unspecified: Secondary | ICD-10-CM | POA: Insufficient documentation

## 2012-09-10 DIAGNOSIS — I10 Essential (primary) hypertension: Secondary | ICD-10-CM

## 2012-09-10 NOTE — Assessment & Plan Note (Signed)
Aggressive management recommended with target LDL 70 or less, if feasible. Continue current dose Lipitor, pending further recommendations.

## 2012-09-10 NOTE — Progress Notes (Signed)
Primary Cardiologist:  HPI: Post hospital followup from [facility], seen by [place] in consultation for evaluation of atypical CP, in the context of known CAD, status post DES LAD, 06/2012.   Patient ruled out for MI with normal MB/troponins. We felt her symptoms to be atypical, and quite possibly related to GI etiology. No medication adjustments were made.  She has since been started on carvedilol, per Dr. Sherryll Burger, for management of uncontrolled BP. She reports improvement since starting this medication, less than one week ago.  From a cardiac perspective, she has not had any recurrent CP. Moreover, she has not experienced any of the symptoms which led to her initial presentation back in August, that being of exertional anterior chest pressure and DOE, since undergoing PCI.   Allergies  Allergen Reactions  . Sulfa Antibiotics   . Sulfa Drugs Cross Reactors     Current Outpatient Prescriptions  Medication Sig Dispense Refill  . alendronate (FOSAMAX) 70 MG tablet Take 70 mg by mouth every 7 (seven) days. Take with a full glass of water on an empty stomach.      [name] amLODipine-benazepril (LOTREL) 5-20 MG per capsule Take 1 capsule by mouth daily.      [name] aspirin EC 81 MG EC tablet Take 1 tablet (81 mg total) by mouth daily.      [name] atorvastatin (LIPITOR) 20 MG tablet Take 1 tablet (20 mg total) by mouth at bedtime.  30 tablet  6  . carvedilol (COREG) 6.25 MG tablet Take 6.25 mg by mouth 2 (two) times daily with a meal.      . Cholecalciferol (VITAMIN D-3) 1000 UNITS CAPS Take 4,000 Units by mouth daily.      . clopidogrel (PLAVIX) 75 MG tablet Take 1 tablet (75 mg total) by mouth daily with breakfast.  30 tablet  11  . cycloSPORINE (RESTASIS) 0.05 % ophthalmic emulsion Place 1 drop into both eyes 2 (two) times daily.      . ferrous sulfate 325 (65 FE) MG tablet Take 325 mg by mouth daily with breakfast.      . gabapentin (NEURONTIN) 100 MG capsule Take 100 mg by mouth at bedtime.      .  hydrocodone-acetaminophen (LORCET PLUS) 7.5-650 MG per tablet Take 1 tablet by mouth every 6 (six) hours as needed. For pain.      [name] levothyroxine (SYNTHROID, LEVOTHROID) 75 MCG tablet Take 75 mcg by mouth daily.      [name] loperamide (IMODIUM A-D) 2 MG tablet Take 0.5 tablets (1 mg total) by mouth daily as needed for diarrhea or loose stools.      . pantoprazole (PROTONIX) 40 MG tablet Take 40 mg by mouth daily.      . psyllium (REGULOID) 0.52 G capsule Take 1.04 g by mouth 2 (two) times daily.      [name] rOPINIRole (REQUIP) 2 MG tablet Take 2 mg by mouth at bedtime.      . vitamin B-12 (CYANOCOBALAMIN) 1000 MCG tablet Take 1,000 mcg by mouth daily.      . nitroGLYCERIN (NITROSTAT) 0.4 MG SL tablet Place 1 tablet (0.4 mg total) under the tongue every 5 (five) minutes as needed for chest pain (up to 3 doses).  25 tablet  4    Past Medical History  Diagnosis Date  . Hypertension   . Hypothyroidism   . GERD (gastroesophageal reflux disease)   . H/O hiatal hernia   . Arthritis   . Diverticulosis   . CAD (coronary artery disease)  a. Unstable angina with newly diagnosed CAD s/p PTCA/DES to LAD 07/23/12.  . Diastolic CHF     a. EF normal 0/9811.  [name] Abnormal CT scan     a. Question of possible renal cyst in RUQ by CT scan 06/2012 at [facility]    Past Surgical History  Procedure Date  . Abdominal hysterectomy   . Cholecystectomy   . Total knee arthroplasty 2000    History   Social History  . Marital Status: Married    Spouse Name: N/A    Number of Children: N/A  . Years of Education: N/A   Occupational History  . Not on file.   Social History Main Topics  . Smoking status: Never Smoker   . Smokeless tobacco: Never Used  . Alcohol Use: No  . Drug Use: No  . Sexually Active: Not Currently   Other Topics Concern  . Not on file   Social History Narrative  . No narrative on file    No family history on file.  ROS: no nausea, vomiting; no fever, chills; no melena, hematochezia;  no claudication  PHYSICAL EXAM: BP 133/69  Pulse 54  Ht 5\' 2"  (1.575 m)  Wt 152 lb 12.8 oz (69.31 kg)  BMI 27.95 kg/m2  SpO2 96% GENERAL: 78 year old female; NAD HEENT: NCAT, PERRLA, EOMI; sclera clear; no xanthelasma NECK: no bruits; no JVD; no TM LUNGS: CTA bilaterally CARDIAC: RRR (S1, S2); no significant murmurs; no rubs or gallops ABDOMEN: soft, non-tender; intact BS EXTREMETIES: no significant peripheral edema SKIN: warm/dry; no obvious rash/lesions MUSCULOSKELETAL: no joint deformity NEURO: no focal deficit; NL affect   EKG:    ASSESSMENT & PLAN:  CAD  s/p LAD stenting 8/13 No further workup indicated. Continue current medication regimen and reassess clinical status in 6 months. Patient to continue on DAPT for at least one year.  Hypertension Recently started on carvedilol, per Dr. Sherryll Burger  HLD (hyperlipidemia) Aggressive management recommended with target LDL 70 or less, if feasible. Continue current dose Lipitor, pending further recommendations.    Gene Aleksia Freiman, PAC

## 2012-09-10 NOTE — Assessment & Plan Note (Signed)
Recently started on carvedilol, per Dr. Sherryll Burger

## 2012-09-10 NOTE — Patient Instructions (Signed)
Continue all current medications. Your physician wants you to follow up in: 6 months.  You will receive a reminder letter in the mail one-two months in advance.  If you don't receive a letter, please call our office to schedule the follow up appointment   

## 2012-09-10 NOTE — Assessment & Plan Note (Signed)
No further workup indicated. Continue current medication regimen and reassess clinical status in 6 months. Patient to continue on DAPT for at least one year.

## 2012-10-19 ENCOUNTER — Ambulatory Visit: Payer: Medicare Other | Admitting: Physician Assistant

## 2012-10-20 ENCOUNTER — Encounter: Payer: Self-pay | Admitting: Cardiovascular Disease

## 2012-10-20 ENCOUNTER — Ambulatory Visit (INDEPENDENT_AMBULATORY_CARE_PROVIDER_SITE_OTHER): Payer: Medicare Other | Admitting: Cardiovascular Disease

## 2012-10-20 VITALS — BP 162/86 | HR 63 | Ht 60.0 in | Wt 151.0 lb

## 2012-10-20 DIAGNOSIS — E785 Hyperlipidemia, unspecified: Secondary | ICD-10-CM

## 2012-10-20 DIAGNOSIS — I10 Essential (primary) hypertension: Secondary | ICD-10-CM

## 2012-10-20 DIAGNOSIS — I2510 Atherosclerotic heart disease of native coronary artery without angina pectoris: Secondary | ICD-10-CM

## 2012-10-20 NOTE — Patient Instructions (Addendum)
Continue same medications.  You can stop Plavix for 1 week after March 1st, 2014.  Follow up in 6 months.

## 2012-10-20 NOTE — Assessment & Plan Note (Signed)
Her blood pressure is elevated but she seems to be anxious and in some discomfort.

## 2012-10-20 NOTE — Assessment & Plan Note (Signed)
The patient reports having a recent lipid profile. Continue treatment with atorvastatin.

## 2012-10-20 NOTE — Assessment & Plan Note (Signed)
The patient seems to be stable from a cardiac standpoint with no recurrent ischemic events. We discussed today the length of dual antiplatelet therapy after drug-eluting stent. I explained to her and her family that the official FDA recommendation is not to interrupt dual antiplatelet therapy for 12 months after a drug-eluting stent placement. However, there has been recent data suggesting safety of interrupting this therapy after 6 months of treatment in patients with the newest generation drug-eluting stents. Thus, I feel it's reasonable to stop Plavix for 7 days after 01/23/2013 if needed. The patient is having significant neck and arm discomfort. Aspirin 81 mg once daily should be continued.

## 2012-10-20 NOTE — Progress Notes (Signed)
HPI  This is a pleasant 78 year old female who is here today for a followup visit to discuss the issue of stopping Plavix if possible for epidural injections. The patient presented in August with chest pain and ruled out for myocardial infarction. She underwent cardiac catheterization which showed significant proximal LAD disease. She underwent an angioplasty and drug-eluting stent placement without complications. She has been stable from a cardiac standpoint since then. Recently, she started having right neck pain radiating to her right hand with numbness. She was diagnosed with cervical spondylosis and was evaluated by Dr. Marikay Alar at [facility]. The patient was started on Lyrica. She is still having significant discomfort. The patient might need epidural injections and possibly surgery.  Allergies  Allergen Reactions  . Sulfa Antibiotics   . Sulfa Drugs Cross Reactors      Current Outpatient Prescriptions on File Prior to Visit  Medication Sig Dispense Refill  . alendronate (FOSAMAX) 70 MG tablet Take 70 mg by mouth every 7 (seven) days. Take with a full glass of water on an empty stomach.      [name] amLODipine-benazepril (LOTREL) 5-20 MG per capsule Take 1 capsule by mouth daily.      [name] aspirin EC 81 MG EC tablet Take 1 tablet (81 mg total) by mouth daily.      [name] atorvastatin (LIPITOR) 20 MG tablet Take 1 tablet (20 mg total) by mouth at bedtime.  30 tablet  6  . carvedilol (COREG) 6.25 MG tablet Take 6.25 mg by mouth 2 (two) times daily with a meal.      . Cholecalciferol (VITAMIN D-3) 1000 UNITS CAPS Take 4,000 Units by mouth daily.      . clopidogrel (PLAVIX) 75 MG tablet Take 1 tablet (75 mg total) by mouth daily with breakfast.  30 tablet  11  . cycloSPORINE (RESTASIS) 0.05 % ophthalmic emulsion Place 1 drop into both eyes 2 (two) times daily.      . ferrous sulfate 325 (65 FE) MG tablet Take 325 mg by mouth daily with breakfast.      . hydrocodone-acetaminophen (LORCET PLUS) 7.5-650  MG per tablet Take 1 tablet by mouth every 6 (six) hours as needed. For pain.      [name] levothyroxine (SYNTHROID, LEVOTHROID) 75 MCG tablet Take 75 mcg by mouth daily.      [name] loperamide (IMODIUM A-D) 2 MG tablet Take 0.5 tablets (1 mg total) by mouth daily as needed for diarrhea or loose stools.      . nitroGLYCERIN (NITROSTAT) 0.4 MG SL tablet Place 1 tablet (0.4 mg total) under the tongue every 5 (five) minutes as needed for chest pain (up to 3 doses).  25 tablet  4  . pantoprazole (PROTONIX) 40 MG tablet Take 40 mg by mouth daily.      . pregabalin (LYRICA) 25 MG capsule Take 25 mg by mouth 3 (three) times daily.      . psyllium (REGULOID) 0.52 G capsule Take 1.04 g by mouth 2 (two) times daily.      [name] rOPINIRole (REQUIP) 2 MG tablet Take 2 mg by mouth at bedtime.      . vitamin B-12 (CYANOCOBALAMIN) 1000 MCG tablet Take 1,000 mcg by mouth daily.         Past Medical History  Diagnosis Date  . Hypertension   . Hypothyroidism   . GERD (gastroesophageal reflux disease)   . H/O hiatal hernia   . Arthritis   . Diverticulosis   . CAD (  coronary artery disease)     a. Unstable angina with newly diagnosed CAD s/p PTCA/DES to LAD 07/23/12.  . Diastolic CHF     a. EF normal 11/6107.  [name] Abnormal CT scan     a. Question of possible renal cyst in RUQ by CT scan 06/2012 at [facility]     Past Surgical History  Procedure Date  . Abdominal hysterectomy   . Cholecystectomy   . Total knee arthroplasty 2000     No family history on file.   History   Social History  . Marital Status: Married    Spouse Name: N/A    Number of Children: N/A  . Years of Education: N/A   Occupational History  . Not on file.   Social History Main Topics  . Smoking status: Never Smoker   . Smokeless tobacco: Never Used  . Alcohol Use: No  . Drug Use: No  . Sexually Active: Not Currently   Other Topics Concern  . Not on file   Social History Narrative  . No narrative on file     PHYSICAL EXAM   BP  162/86  Pulse 63  Ht 5' (1.524 m)  Wt 151 lb (68.493 kg)  BMI 29.49 kg/m2 Constitutional: She is oriented to person, place, and time. She appears well-developed and well-nourished. No distress.  HENT: No nasal discharge.  Head: Normocephalic and atraumatic.  Eyes: Pupils are equal and round. Right eye exhibits no discharge. Left eye exhibits no discharge.  Neck: Normal range of motion. Neck supple. No JVD present. No thyromegaly present.  Cardiovascular: Normal rate, regular rhythm, normal heart sounds. Exam reveals no gallop and no friction rub. No murmur heard.  Pulmonary/Chest: Effort normal and breath sounds normal. No stridor. No respiratory distress. She has no wheezes. She has no rales. She exhibits no tenderness.  Abdominal: Soft. Bowel sounds are normal. She exhibits no distension. There is no tenderness. There is no rebound and no guarding.  Musculoskeletal: Normal range of motion. She exhibits no edema and no tenderness.  Neurological: She is alert and oriented to person, place, and time. Coordination normal.  Skin: Skin is warm and dry. No rash noted. She is not diaphoretic. No erythema. No pallor.  Psychiatric: She has a normal mood and affect. Her behavior is normal. Judgment and thought content normal.     ASSESSMENT AND PLAN

## 2013-02-24 ENCOUNTER — Other Ambulatory Visit: Payer: Self-pay | Admitting: Physician Assistant

## 2013-05-11 ENCOUNTER — Observation Stay (HOSPITAL_COMMUNITY)
Admission: AD | Admit: 2013-05-11 | Discharge: 2013-05-13 | Disposition: A | Discharge status: Home / Self Care | Payer: Medicare Other | Source: Ambulatory Visit | Attending: Cardiovascular Disease | Admitting: Cardiovascular Disease

## 2013-05-11 ENCOUNTER — Inpatient Hospital Stay: Admission: EM | Admit: 2013-05-11 | Payer: Self-pay | Source: Other Acute Inpatient Hospital | Admitting: Cardiology

## 2013-05-11 ENCOUNTER — Other Ambulatory Visit: Payer: Self-pay

## 2013-05-11 ENCOUNTER — Other Ambulatory Visit: Payer: Self-pay | Admitting: Physician Assistant

## 2013-05-11 ENCOUNTER — Encounter (HOSPITAL_COMMUNITY): Payer: Self-pay | Admitting: General Practice

## 2013-05-11 ENCOUNTER — Encounter (HOSPITAL_COMMUNITY)
Admission: AD | Disposition: A | Discharge status: Home / Self Care | Payer: Self-pay | Source: Ambulatory Visit | Attending: Cardiovascular Disease

## 2013-05-11 DIAGNOSIS — N189 Chronic kidney disease, unspecified: Secondary | ICD-10-CM | POA: Insufficient documentation

## 2013-05-11 DIAGNOSIS — R079 Chest pain, unspecified: Secondary | ICD-10-CM

## 2013-05-11 DIAGNOSIS — I5032 Chronic diastolic (congestive) heart failure: Secondary | ICD-10-CM | POA: Insufficient documentation

## 2013-05-11 DIAGNOSIS — I200 Unstable angina: Secondary | ICD-10-CM

## 2013-05-11 DIAGNOSIS — R0602 Shortness of breath: Secondary | ICD-10-CM | POA: Insufficient documentation

## 2013-05-11 DIAGNOSIS — I509 Heart failure, unspecified: Secondary | ICD-10-CM | POA: Insufficient documentation

## 2013-05-11 DIAGNOSIS — I129 Hypertensive chronic kidney disease with stage 1 through stage 4 chronic kidney disease, or unspecified chronic kidney disease: Secondary | ICD-10-CM | POA: Insufficient documentation

## 2013-05-11 DIAGNOSIS — I2510 Atherosclerotic heart disease of native coronary artery without angina pectoris: Principal | ICD-10-CM | POA: Insufficient documentation

## 2013-05-11 DIAGNOSIS — E785 Hyperlipidemia, unspecified: Secondary | ICD-10-CM | POA: Insufficient documentation

## 2013-05-11 DIAGNOSIS — I10 Essential (primary) hypertension: Secondary | ICD-10-CM

## 2013-05-11 DIAGNOSIS — I252 Old myocardial infarction: Secondary | ICD-10-CM | POA: Insufficient documentation

## 2013-05-11 DIAGNOSIS — I255 Ischemic cardiomyopathy: Secondary | ICD-10-CM

## 2013-05-11 HISTORY — DX: Low back pain, unspecified: M54.50

## 2013-05-11 HISTORY — DX: Iron deficiency anemia, unspecified: D50.9

## 2013-05-11 HISTORY — DX: Unspecified asthma, uncomplicated: J45.909

## 2013-05-11 HISTORY — PX: CARDIAC CATHETERIZATION: SHX172

## 2013-05-11 HISTORY — DX: Low back pain: M54.5

## 2013-05-11 HISTORY — DX: Venous insufficiency (chronic) (peripheral): I87.2

## 2013-05-11 HISTORY — DX: Chronic kidney disease, unspecified: N18.9

## 2013-05-11 HISTORY — DX: Pure hypercholesterolemia, unspecified: E78.00

## 2013-05-11 HISTORY — DX: Left bundle-branch block, unspecified: I44.7

## 2013-05-11 HISTORY — DX: Other chronic pain: G89.29

## 2013-05-11 HISTORY — PX: LEFT HEART CATHETERIZATION WITH CORONARY ANGIOGRAM: SHX5451

## 2013-05-11 LAB — POCT ACTIVATED CLOTTING TIME: Activated Clotting Time: 149 seconds

## 2013-05-11 SURGERY — LEFT HEART CATHETERIZATION WITH CORONARY ANGIOGRAM
Anesthesia: LOCAL

## 2013-05-11 MED ORDER — NITROGLYCERIN 0.4 MG SL SUBL: 0.4000 mg | SUBLINGUAL_TABLET | SUBLINGUAL | Status: DC | PRN

## 2013-05-11 MED ORDER — CYCLOSPORINE 0.05 % OP EMUL: 1.0000 [drp] | Freq: Two times a day (BID) | OPHTHALMIC | Status: DC

## 2013-05-11 MED ORDER — NITROGLYCERIN 0.2 MG/ML ON CALL CATH LAB
INTRAVENOUS | Status: AC
  Filled 2013-05-11: qty 1

## 2013-05-11 MED ORDER — CLOPIDOGREL BISULFATE 75 MG PO TABS
75.0000 mg | ORAL_TABLET | Freq: Every day | ORAL | Status: DC
  Administered 2013-05-12: 75 mg via ORAL
  Filled 2013-05-11: qty 1

## 2013-05-11 MED ORDER — PSYLLIUM 95 % PO PACK
1.0000 | PACK | Freq: Two times a day (BID) | ORAL | Status: DC
  Administered 2013-05-11: 18:00:00 via ORAL
  Administered 2013-05-12 – 2013-05-13 (×3): 1 via ORAL
  Filled 2013-05-11 (×5): qty 1

## 2013-05-11 MED ORDER — CYCLOSPORINE 0.05 % OP EMUL
1.0000 [drp] | Freq: Two times a day (BID) | OPHTHALMIC | Status: DC
  Administered 2013-05-11: 1 [drp] via OPHTHALMIC
  Administered 2013-05-12: 21:00:00 via OPHTHALMIC
  Administered 2013-05-13: 1 [drp] via OPHTHALMIC
  Filled 2013-05-11 (×5): qty 1

## 2013-05-11 MED ORDER — SODIUM CHLORIDE 0.9 % IV SOLN: INTRAVENOUS | Status: AC

## 2013-05-11 MED ORDER — ASPIRIN EC 81 MG PO TBEC
81.0000 mg | DELAYED_RELEASE_TABLET | Freq: Every day | ORAL | Status: DC
  Administered 2013-05-12: 81 mg via ORAL
  Filled 2013-05-11: qty 1

## 2013-05-11 MED ORDER — PANTOPRAZOLE SODIUM 40 MG PO TBEC
40.0000 mg | DELAYED_RELEASE_TABLET | Freq: Every day | ORAL | Status: DC
Start: 2013-05-11 — End: 2013-05-13
  Administered 2013-05-12 – 2013-05-13 (×2): 40 mg via ORAL
  Filled 2013-05-11 (×2): qty 1

## 2013-05-11 MED ORDER — ONDANSETRON HCL 4 MG/2ML IJ SOLN: 4.0000 mg | Freq: Four times a day (QID) | INTRAMUSCULAR | Status: DC | PRN

## 2013-05-11 MED ORDER — AMLODIPINE BESYLATE 5 MG PO TABS
5.0000 mg | ORAL_TABLET | Freq: Every day | ORAL | Status: DC
  Administered 2013-05-11 – 2013-05-13 (×3): 5 mg via ORAL
  Filled 2013-05-11 (×3): qty 1

## 2013-05-11 MED ORDER — HEPARIN (PORCINE) IN NACL 2-0.9 UNIT/ML-% IJ SOLN
INTRAMUSCULAR | Status: AC
  Filled 2013-05-11: qty 1000

## 2013-05-11 MED ORDER — LIDOCAINE HCL (PF) 1 % IJ SOLN
INTRAMUSCULAR | Status: AC
  Filled 2013-05-11: qty 30

## 2013-05-11 MED ORDER — LEVOTHYROXINE SODIUM 75 MCG PO TABS
75.0000 ug | ORAL_TABLET | Freq: Every day | ORAL | Status: DC
  Administered 2013-05-12 – 2013-05-13 (×2): 75 ug via ORAL
  Filled 2013-05-11 (×3): qty 1

## 2013-05-11 MED ORDER — CARVEDILOL 6.25 MG PO TABS
6.2500 mg | ORAL_TABLET | Freq: Two times a day (BID) | ORAL | Status: DC
Start: 2013-05-11 — End: 2013-05-13
  Administered 2013-05-11 – 2013-05-13 (×4): 6.25 mg via ORAL
  Filled 2013-05-11 (×6): qty 1

## 2013-05-11 MED ORDER — MIDAZOLAM HCL 2 MG/2ML IJ SOLN
INTRAMUSCULAR | Status: AC
  Filled 2013-05-11: qty 2

## 2013-05-11 MED ORDER — OXYCODONE-ACETAMINOPHEN 5-325 MG PO TABS
1.0000 | ORAL_TABLET | Freq: Two times a day (BID) | ORAL | Status: DC
  Administered 2013-05-11 – 2013-05-12 (×2): 1 via ORAL
  Filled 2013-05-11 (×2): qty 1

## 2013-05-11 MED ORDER — ROPINIROLE HCL 1 MG PO TABS
2.0000 mg | ORAL_TABLET | Freq: Every day | ORAL | Status: DC
  Administered 2013-05-11 – 2013-05-12 (×2): 2 mg via ORAL
  Filled 2013-05-11 (×5): qty 2

## 2013-05-11 MED ORDER — FERROUS SULFATE 325 (65 FE) MG PO TABS
325.0000 mg | ORAL_TABLET | Freq: Every day | ORAL | Status: DC
  Administered 2013-05-12 – 2013-05-13 (×2): 325 mg via ORAL
  Filled 2013-05-11 (×3): qty 1

## 2013-05-11 MED ORDER — ASPIRIN EC 81 MG PO TBEC: 81.0000 mg | DELAYED_RELEASE_TABLET | Freq: Every day | ORAL | Status: DC

## 2013-05-11 MED ORDER — FERROUS SULFATE 325 (65 FE) MG PO TABS: 325.0000 mg | ORAL_TABLET | Freq: Every morning | ORAL | Status: DC

## 2013-05-11 MED ORDER — PSYLLIUM 0.52 G PO CAPS: 1.0400 g | ORAL_CAPSULE | Freq: Two times a day (BID) | ORAL | Status: DC

## 2013-05-11 MED ORDER — PANTOPRAZOLE SODIUM 40 MG PO TBEC: 40.0000 mg | DELAYED_RELEASE_TABLET | Freq: Every day | ORAL | Status: DC

## 2013-05-11 MED ORDER — CLOPIDOGREL BISULFATE 75 MG PO TABS: 75.0000 mg | ORAL_TABLET | Freq: Every day | ORAL | Status: DC

## 2013-05-11 MED ORDER — ACETAMINOPHEN 325 MG PO TABS: 650.0000 mg | ORAL_TABLET | ORAL | Status: DC | PRN

## 2013-05-11 MED ORDER — SODIUM CHLORIDE 0.9 % IV SOLN: INTRAVENOUS | Status: DC

## 2013-05-11 MED ORDER — PSYLLIUM 95 % PO PACK: 1.0000 | PACK | Freq: Every day | ORAL | Status: DC

## 2013-05-11 MED ORDER — NITROGLYCERIN 2 % TD OINT
0.5000 [in_us] | TOPICAL_OINTMENT | Freq: Four times a day (QID) | TRANSDERMAL | Status: DC
  Administered 2013-05-11 – 2013-05-12 (×5): 0.5 [in_us] via TOPICAL
  Filled 2013-05-11: qty 30

## 2013-05-11 MED ORDER — ATORVASTATIN CALCIUM 20 MG PO TABS
20.0000 mg | ORAL_TABLET | Freq: Every day | ORAL | Status: DC
  Filled 2013-05-11: qty 1

## 2013-05-11 MED ORDER — PREGABALIN 25 MG PO CAPS
25.0000 mg | ORAL_CAPSULE | Freq: Three times a day (TID) | ORAL | Status: DC
  Administered 2013-05-11 – 2013-05-13 (×5): 25 mg via ORAL
  Filled 2013-05-11 (×5): qty 1

## 2013-05-11 MED ORDER — BENAZEPRIL HCL 20 MG PO TABS
20.0000 mg | ORAL_TABLET | Freq: Every day | ORAL | Status: DC
  Administered 2013-05-11 – 2013-05-13 (×3): 20 mg via ORAL
  Filled 2013-05-11 (×3): qty 1

## 2013-05-11 MED ORDER — METOPROLOL TARTRATE 25 MG PO TABS
25.0000 mg | ORAL_TABLET | Freq: Two times a day (BID) | ORAL | Status: DC
  Administered 2013-05-11 – 2013-05-12 (×2): 25 mg via ORAL
  Filled 2013-05-11 (×2): qty 1

## 2013-05-11 MED ORDER — ATORVASTATIN CALCIUM 80 MG PO TABS
80.0000 mg | ORAL_TABLET | Freq: Every day | ORAL | Status: DC
  Administered 2013-05-11 – 2013-05-12 (×2): 80 mg via ORAL
  Filled 2013-05-11 (×2): qty 1

## 2013-05-11 NOTE — CV Procedure (Signed)
   Cardiac Catheterization Operative Report  Ana Pena [id number] 6/17/20142:26 PM VYAS,DHRUV B., MD  Procedure Performed:  1. Left Heart Catheterization 2. Selective Coronary Angiography 3. Left ventricular pressures  Operator: Verne Carrow, MD  Indication:  79 yo female with history of CAD with DES proximal LAD 8/13, HTN, HLD, GERD, CKD, Diastolic CHF, hypothyriodism transferred here from [facility] where she presented with c/o severe chest pain associated with diaphoresis, SOB and nausea. Troponin mildly elevated at 0.14. New LBBB on EKG. D-dimer elevated but CTA chest without evidence of PE. Chest pain has resolved.                                      Procedure Details: The risks, benefits, complications, treatment options, and expected outcomes were discussed with the patient. The patient and/or family concurred with the proposed plan, giving informed consent. The patient was brought to the cath lab after IV hydration was begun and oral premedication was given. The patient was further sedated with Versed. The right groin was prepped and draped in the usual manner. Using the modified Seldinger access technique, a 5 French sheath was placed in the right femoral artery. Standard diagnostic catheters were used to perform selective coronary angiography. A pigtail catheter was used to measure LV pressures.   There were no immediate complications. The patient was taken to the recovery area in stable condition.   Hemodynamic Findings: Central aortic pressure: 161/60 Left ventricular pressure: 159/8/13  Angiographic Findings:  Left main: No obstructive disease.   Left Anterior Descending Artery: Large caliber vessel that courses to the apex. The proximal vessel has 20% stenosis. The mid vessel has a patent stented segment with no restenosis noted. The diagonal is patent. The mid to distal vessel has a focal 50% smooth stenosis which does not appear to be flow limiting,  unchanged in appearance from cath in 2013.   Circumflex Artery: Moderate sized vessel with proximal plaque, 40% smooth stenosis in the mid vessel. The first OM is small and patent. The AV groove Circumflex terminates into a moderate caliber second OM branch.  Right Coronary Artery: Large, dominant vessel with 30% mid stenosis.   Left Ventricular Angiogram: Deferred.  Impression: 1. Stable single vessel CAD with patent stent proximal to mid LAD 2. Mild to moderate non-obstructive disease in the distal LAD, distal Circumflex and mid RCA  Recommendations: Continue medical management. Her d-dimer is slightly elevated but CTA chest at [facility] without evidence of PE. Will monitor overnight.        Complications:  None. The patient tolerated the procedure well.

## 2013-05-11 NOTE — Interval H&P Note (Signed)
History and Physical Interval Note:  05/11/2013 1:32 PM  [name]  has presented today for cardiac cath with the diagnosis of CAD/NSTEMI/chest pain.  The various methods of treatment have been discussed with the patient and family. After consideration of risks, benefits and other options for treatment, the patient has consented to  Procedure(s): LEFT HEART CATHETERIZATION WITH CORONARY ANGIOGRAM (N/A) as a surgical intervention .  The patient's history has been reviewed, patient examined, no change in status, stable for surgery.  I have reviewed the patient's chart and labs.  Questions were answered to the patient's satisfaction.    Cath Lab Visit (complete for each Cath Lab visit)  Clinical Evaluation Leading to the Procedure:   ACS: yes  Non-ACS:    Anginal Classification: CCS IV  Anti-ischemic medical therapy: No Therapy  Non-Invasive Test Results: No non-invasive testing performed  Prior CABG: No previous CABG        MCALHANY,CHRISTOPHER

## 2013-05-11 NOTE — H&P (Signed)
See full H and P in the shadow chart.   Briefly, 79 yo female with history of CAD with DES proximal LAD 8/13, HTN, HLD, GERD, CKD, Diastolic CHF, hypothyriodism transferred here from [facility] where she presented with c/o severe chest pain associated with diaphoresis, SOB and nausea. Troponin mildly elevated at 0.14. New LBBB on EKG.   Pt CP free upon arrival to [facility].   Plan: Will plan cardiac cath with possible PCI today. Pt understands the risks of the procedure and agrees to proceed.   MCALHANY,CHRISTOPHER 1:31 PM 05/11/2013

## 2013-05-12 DIAGNOSIS — I2510 Atherosclerotic heart disease of native coronary artery without angina pectoris: Secondary | ICD-10-CM

## 2013-05-12 DIAGNOSIS — I519 Heart disease, unspecified: Secondary | ICD-10-CM

## 2013-05-12 LAB — BASIC METABOLIC PANEL
Chloride: 107 mEq/L (ref 96–112)
GFR calc Af Amer: 47 mL/min — ABNORMAL LOW (ref >90)
GFR calc non Af Amer: 41 mL/min — ABNORMAL LOW (ref >90)
Glucose, Bld: 107 mg/dL — ABNORMAL HIGH (ref 70–99)
Potassium: 3.9 mEq/L (ref 3.5–5.1)
Sodium: 141 mEq/L (ref 135–145)

## 2013-05-12 LAB — MAGNESIUM: Magnesium: 1.9 mg/dL (ref 1.5–2.5)

## 2013-05-12 LAB — CBC
Hemoglobin: 10.4 g/dL — ABNORMAL LOW (ref 12.0–15.0)
RBC: 3.79 MIL/uL — ABNORMAL LOW (ref 3.87–5.11)

## 2013-05-12 LAB — LIPID PANEL
HDL: 31 mg/dL — ABNORMAL LOW (ref >39)
Triglycerides: 109 mg/dL (ref <150)

## 2013-05-12 MED ORDER — PERFLUTREN LIPID MICROSPHERE
1.0000 mL | INTRAVENOUS | Status: AC | PRN
  Administered 2013-05-12 (×2): 1 mL via INTRAVENOUS
  Filled 2013-05-12 (×2): qty 10

## 2013-05-12 NOTE — Progress Notes (Signed)
    SUBJECTIVE: No chest pain or SOB.   BP 143/48  Pulse 61  Temp(Src) 97.7 F (36.5 C) (Oral)  Resp 20  Ht 5' (1.524 m)  Wt 151 lb 3.8 oz (68.6 kg)  BMI 29.54 kg/m2  SpO2 93%  Intake/Output Summary (Last 24 hours) at 05/12/13 0717 Last data filed at 05/12/13 1610  Gross per 24 hour  Intake  787.5 ml  Output   1300 ml  Net -512.5 ml    PHYSICAL EXAM General: Well developed, well nourished, in no acute distress. Alert and oriented x 3.  Psych:  Good affect, responds appropriately Neck: No JVD. No masses noted.  Lungs: Clear bilaterally with no wheezes or rhonci noted.  Heart: RRR with no murmurs noted. Abdomen: Bowel sounds are present. Soft, non-tender.  Extremities: No lower extremity edema. Right groin ok.   LABS: BMET    Component Value Date/Time   NA 141 05/12/2013 0540   K 3.9 05/12/2013 0540   CL 107 05/12/2013 0540   CO2 27 05/12/2013 0540   GLUCOSE 107* 05/12/2013 0540   BUN 26* 05/12/2013 0540   CREATININE 1.19* 05/12/2013 0540   CALCIUM 8.8 05/12/2013 0540   GFRNONAA 41* 05/12/2013 0540   GFRAA 47* 05/12/2013 0540   CBC:  Recent Labs  05/12/13 0540  WBC 4.8  HGB 10.4*  HCT 31.8*  MCV 83.9  PLT 153   Current Meds: . amLODipine  5 mg Oral Daily  . aspirin EC  81 mg Oral Daily  . atorvastatin  80 mg Oral q1800  . benazepril  20 mg Oral Daily  . carvedilol  6.25 mg Oral BID WC  . clopidogrel  75 mg Oral Q breakfast  . cycloSPORINE  1 drop Both Eyes BID  . ferrous sulfate  325 mg Oral Q breakfast  . levothyroxine  75 mcg Oral QAC breakfast  . metoprolol tartrate  25 mg Oral BID  . nitroGLYCERIN  0.5 inch Topical Q6H  . oxyCODONE-acetaminophen  1 tablet Oral BID  . pantoprazole  40 mg Oral Daily  . pregabalin  25 mg Oral TID  . psyllium  1 packet Oral BID  . rOPINIRole  2 mg Oral QHS   Cardiac cath 05/11/13: Left main: No obstructive disease.  Left Anterior Descending Artery: Large caliber vessel that courses to the apex. The proximal vessel has  20% stenosis. The mid vessel has a patent stented segment with no restenosis noted. The diagonal is patent. The mid to distal vessel has a focal 50% smooth stenosis which does not appear to be flow limiting, unchanged in appearance from cath in 2013.  Circumflex Artery: Moderate sized vessel with proximal plaque, 40% smooth stenosis in the mid vessel. The first OM is small and patent. The AV groove Circumflex terminates into a moderate caliber second OM branch.  Right Coronary Artery: Large, dominant vessel with 30% mid stenosis.  Left Ventricular Angiogram: Deferred.  Impression:  1. Stable single vessel CAD with patent stent proximal to mid LAD  2. Mild to moderate non-obstructive disease in the distal LAD, distal Circumflex and mid RCA   ASSESSMENT AND PLAN:  1. CAD: Stable by cath yesterday. Patent LAD stent. No change in other disease. Mild elevation of troponin 0.14 at outside hospital but no culprit on cath yesterday. No recurrent chest pain. Will continue ASA/Plavix/beta blocker/statin. Will check echo today. Ambulate. Possible afternoon d/c if echo ok.       Ana Pena,Ana Pena  6/18/20147:17 AM

## 2013-05-12 NOTE — Progress Notes (Signed)
Utilization Review Completed Sanay Belmar J. Burwell Bethel, RN, BSN, NCM [phone]  

## 2013-05-12 NOTE — Progress Notes (Signed)
*  PRELIMINARY RESULTS* Echocardiogram 2D Echocardiogram has been performed.  Ana Pena 05/12/2013, 4:02 PM

## 2013-05-13 ENCOUNTER — Encounter (HOSPITAL_COMMUNITY): Payer: Self-pay | Admitting: Physician Assistant

## 2013-05-13 DIAGNOSIS — I2589 Other forms of chronic ischemic heart disease: Secondary | ICD-10-CM

## 2013-05-13 DIAGNOSIS — I10 Essential (primary) hypertension: Secondary | ICD-10-CM

## 2013-05-13 DIAGNOSIS — I255 Ischemic cardiomyopathy: Secondary | ICD-10-CM | POA: Diagnosis present

## 2013-05-13 MED ORDER — CLOPIDOGREL BISULFATE 75 MG PO TABS
75.0000 mg | ORAL_TABLET | Freq: Every day | ORAL | Status: DC
  Administered 2013-05-13: 75 mg via ORAL
  Filled 2013-05-13: qty 1

## 2013-05-13 MED ORDER — ASPIRIN EC 81 MG PO TBEC
81.0000 mg | DELAYED_RELEASE_TABLET | Freq: Every day | ORAL | Status: DC
  Administered 2013-05-13: 81 mg via ORAL
  Filled 2013-05-13: qty 1

## 2013-05-13 NOTE — Discharge Summary (Signed)
See full note.cdm 

## 2013-05-13 NOTE — Discharge Summary (Signed)
Discharge Summary   Patient ID: CYLAH FANNIN MRN: [id number], DOB/AGE: 1926-01-05 79 y.o. Admit date: 05/11/2013 D/C date:     05/13/2013  Primary Cardiologist: Previously Kirke Corin in [place]  Primary Discharge Diagnoses:  1. CAD/mildly elevated troponin this admission - cath 05/12/13: no culprit lesion, patent LAD stent, no change in other disease, for med rx - history of DES to prox LAD 06/2012 2. Cardiomyopathy - EF 40-45% demonstrated by echo 05/12/13, previously reported as normal 3. New LBBB 4. Chronic diastolic CHF 5. CKD 6. Hypokalemia 7. Hypomagnesemia 8. HTN 9. HLD 10. Incompletely visualized low-intermediate attenuation lesion in upper right retroperitoneum by CT angio at [facility] 05/11/13 11. Iron deficiency anemia  Secondary Discharge Diagnoses:  1. Hypothyroidism 2. Chronic venous insufficiency 3. Hiatal hernia 4. Arthritis 5. Diverticulosis 6. Question R renal cyst on prior imaging, may also coincide with #10 7. GERD 8. Arthritis 9. Chronic low back pain   Hospital Course: Ms. Ana Pena is an 79 y/o F with history of CAD s/p DES to LAD 06/2012, chronic diastolic CHF, CKD, HTN who presented to [facility] with severe nocturnal angina, 8/10 with associated diaphoresis, dyspnea, and nausea without vomiting. She was placed on NTG paste and 2mg  of morphine with eventual resolution of pain. EKG showed NSR with LBBB that was newly recognized. Troponins were 0.03 then 0.14 which was abnormal by their reference range. She was placed on IV heparin and transferred over for cardiac cath. She underwent this procedure 05/11/13 demonstrating: Impression:  1. Stable single vessel CAD with patent stent proximal to mid LAD  2. Mild to moderate non-obstructive disease in the distal LAD, distal Circumflex and mid RCA Continued medical management was recommended. Of note she had a CT angio 05/11/13 at [facility] which showed no evidence of PE, but did show CAD as well as large incompletely  visualized low-intermediate attenuation lesion in upper right retroperiteum is favored to represent a large mildly proteinaceous cyst from either upper pole of the kidney, or potentially the posterior aspect of the liver, can be further evaluated with nonemergent [place] if of clinical concern. There is question of prior R renal cyst in her medical history so this may be the same finding. The patient was informed of this finding and instructed to f/u PCP to discus further. 2D echo showed EF 45-50%, and definity was used to exclude LV thrombus and was negative for such. She feels well this morning. BP is slightly elevated but Dr. Clifton James had long discussion with the patient about this - she notes SBP 140-150 at home but has tendency towards dizziness with lower BP thus regimen was kept stable. Her Hgb was noted to be low at 10.4 but no evidence of bleeding noted, thus was recommended to f/u PCP for this as well. She remains on iron supplementation as at home. Dr. Clifton James has seen and examined her today and feels she is stable for discharge.  Discharge Vitals: Blood pressure 155/57, pulse 56, temperature 98.1 F (36.7 C), temperature source Oral, resp. rate 18, height 5' (1.524 m), weight 159 lb 9.6 oz (72.394 kg), SpO2 95.00%.  Labs: Lab Results  Component Value Date   WBC 4.8 05/12/2013   HGB 10.4* 05/12/2013   HCT 31.8* 05/12/2013   MCV 83.9 05/12/2013   PLT 153 05/12/2013     Recent Labs Lab 05/12/13 0540  NA 141  K 3.9  CL 107  CO2 27  BUN 26*  CREATININE 1.19*  CALCIUM 8.8  GLUCOSE 107*  Lab Results  Component Value Date   CHOL 94 05/12/2013   HDL 31* 05/12/2013   LDLCALC 41 05/12/2013   TRIG 109 05/12/2013   No results found for this basename: DDIMER    Diagnostic Studies/Procedures   CT angio at [facility], see scan.   Cardiac catheterization this admission, please see full report and above for summary.   2D Echo 05/12/13 - Limited study with Definity (see report from full  study earlier today). EF 40-45% with diffuse hypokinesis. There dose not appear to be an apical thrombus.   2D Echo 05/12/13 - Left ventricle: The cavity size was normal. Wall thickness was normal. The estimated ejection fraction was 40%. Diffuse hypokinesis, worse towards the apex. Apex was not well-visualized, cannot rule out thrombus apical inferior wall. Doppler parameters are consistent with abnormal left ventricular relaxation (grade 1 diastolic dysfunction). - Aortic valve: There was no stenosis. Trivial regurgitation. - Mitral valve: Mildly calcified annulus. No significant regurgitation. - Left atrium: The atrium was mildly to moderately dilated. - Right ventricle: The cavity size was normal. Systolic function was normal. - Tricuspid valve: Peak RV-RA gradient: 22mm Hg (S). - Pulmonary arteries: PA peak pressure: 27mm Hg (S). - Inferior vena cava: The vessel was normal in size; the respirophasic diameter changes were in the normal range (= 50%); findings are consistent with normal central venous pressure. - Pericardium, extracardiac: A trivial pericardial effusion was identified posterior to the heart. Impressions: - Normal LV size with EF 40%. There was diffuse hypokinesis, worse towards the apex. Cannot rule out thrombus on apical inferior wall. Normal RV size and systolic function. No significant valvular abnormalities. I have asked the sonographers to repeat the study with Definity to rule in or out thrombus and to see the apex better.   Discharge Medications     Medication List    TAKE these medications       alendronate 70 MG tablet  Commonly known as:  FOSAMAX  Take 70 mg by mouth every 7 (seven) days. Take with a full glass of water on an empty stomach.     amLODipine-benazepril 5-20 MG per capsule  Commonly known as:  LOTREL  Take 1 capsule by mouth daily.     aspirin 81 MG EC tablet  Take 1 tablet (81 mg total) by mouth daily.     atorvastatin  20 MG tablet  Commonly known as:  LIPITOR  TAKE ONE TABLET BY MOUTH AT BEDTIME     carvedilol 6.25 MG tablet  Commonly known as:  COREG  Take 6.25 mg by mouth 2 (two) times daily with a meal.     clopidogrel 75 MG tablet  Commonly known as:  PLAVIX  Take 1 tablet (75 mg total) by mouth daily with breakfast.     cycloSPORINE 0.05 % ophthalmic emulsion  Commonly known as:  RESTASIS  Place 1 drop into both eyes 2 (two) times daily.     ferrous sulfate 325 (65 FE) MG tablet  Take 325 mg by mouth daily with breakfast.     levothyroxine 75 MCG tablet  Commonly known as:  SYNTHROID, LEVOTHROID  Take 75 mcg by mouth daily.     loperamide 2 MG tablet  Commonly known as:  IMODIUM A-D  Take 0.5 tablets (1 mg total) by mouth daily as needed for diarrhea or loose stools.     nitroGLYCERIN 0.4 MG SL tablet  Commonly known as:  NITROSTAT  Place 1 tablet (0.4 mg total) under the tongue every  5 (five) minutes as needed for chest pain (up to 3 doses).     oxyCODONE-acetaminophen 5-325 MG per tablet  Commonly known as:  PERCOCET/ROXICET  Take 1 tablet by mouth every 4 (four) hours as needed for pain.     pantoprazole 40 MG tablet  Commonly known as:  PROTONIX  Take 40 mg by mouth daily.     pregabalin 25 MG capsule  Commonly known as:  LYRICA  Take 25 mg by mouth 3 (three) times daily.     psyllium 0.52 G capsule  Commonly known as:  REGULOID  Take 1.04 g by mouth 2 (two) times daily.     rOPINIRole 2 MG tablet  Commonly known as:  REQUIP  Take 2 mg by mouth at bedtime.     vitamin B-12 1000 MCG tablet  Commonly known as:  CYANOCOBALAMIN  Take 1,000 mcg by mouth daily.     Vitamin D-3 1000 UNITS Caps  Take 4,000 Units by mouth daily.        Disposition   The patient will be discharged in stable condition to home. Discharge Orders   Future Appointments Provider Department Dept Phone   06/03/2013 1:00 PM Prescott Parma, PA-C [place] [facility] [place]  [phone]   Future Orders Complete By Expires     Diet - low sodium heart healthy  As directed     Discharge instructions  As directed     Comments:      One of your heart tests showed mild weakness of the heart muscle this admission. This may make you more susceptible to weight gain from fluid retention, which can lead to symptoms that we call heart failure. For patients with congestive heart failure, we give them these special instructions:  1. Follow a low-salt diet and watch your fluid intake. In general, you should not be taking in more than 2 liters of fluid per day (no more than 8 glasses per day). Some patients are restricted to less than 1.5 liters of fluid per day (no more than 6 glasses per day). This includes sources of water in foods like soup, coffee, tea, milk, etc. 2. Weigh yourself on the same scale at same time of day and keep a log. 3. Call your doctor: (Anytime you feel any of the following symptoms)  - 3-4 pound weight gain in 1-2 days or 2 pounds overnight  - Shortness of breath, with or without a dry hacking cough  - Swelling in the hands, feet or stomach  - If you have to sleep on extra pillows at night in order to breathe   IT IS IMPORTANT TO LET YOUR DOCTOR KNOW EARLY ON IF YOU ARE HAVING SYMPTOMS SO WE CAN HELP YOU!    Increase activity slowly  As directed     Comments:      No driving for 1 week. No lifting over 5 lbs for 1 week. No sexual activity for 1 week. Keep procedure site clean & dry. If you notice increased pain, swelling, bleeding or pus, call/return!  You may shower, but no soaking baths/hot tubs/pools for 1 week.      Follow-up Information   Follow up with SERPE, EUGENE, PA-C. (06/03/13 at 1pm)    Contact information:   [address] [place] [address] [phone] [facility]      Follow up with VYAS,DHRUV B., MD. (Your CT angio at [facility] showed a density in your upper right abdomen which  could represent a kidney or  liver cyst. Please call your primary doctor to discuss further evaluation. Your labs also showed a mild anemia - please discuss with PCP.)    Contact information:   [address] [place] [address] [phone]         Duration of Discharge Encounter: Greater than 30 minutes including physician and PA time.  Signed, Ronie Spies PA-C 05/13/2013, 8:38 AM

## 2013-05-13 NOTE — Progress Notes (Signed)
SUBJECTIVE: Feels great. No chest pain or SOB.   BP 155/57  Pulse 56  Temp(Src) 98.1 F (36.7 C) (Oral)  Resp 18  Ht 5' (1.524 m)  Wt 159 lb 9.6 oz (72.394 kg)  BMI 31.17 kg/m2  SpO2 95%  Intake/Output Summary (Last 24 hours) at 05/13/13 0719 Last data filed at 05/12/13 1445  Gross per 24 hour  Intake    240 ml  Output    200 ml  Net     40 ml    PHYSICAL EXAM General: Well developed, well nourished, in no acute distress. Alert and oriented x 3.  Psych:  Good affect, responds appropriately Neck: No JVD. No masses noted.  Lungs: Clear bilaterally with no wheezes or rhonci noted.  Heart: RRR with no murmurs noted. Abdomen: Bowel sounds are present. Soft, non-tender.  Extremities: No lower extremity edema.   LABS: Basic Metabolic Panel:  Recent Labs  16/10/96 0540  NA 141  K 3.9  CL 107  CO2 27  GLUCOSE 107*  BUN 26*  CREATININE 1.19*  CALCIUM 8.8  MG 1.9   CBC:  Recent Labs  05/12/13 0540  WBC 4.8  HGB 10.4*  HCT 31.8*  MCV 83.9  PLT 153   Fasting Lipid Panel:  Recent Labs  05/12/13 0540  CHOL 94  HDL 31*  LDLCALC 41  TRIG 045  CHOLHDL 3.0    Current Meds: . amLODipine  5 mg Oral Daily  . aspirin EC  81 mg Oral Daily  . benazepril  20 mg Oral Daily  . carvedilol  6.25 mg Oral BID WC  . clopidogrel  75 mg Oral Q breakfast  . cycloSPORINE  1 drop Both Eyes BID  . ferrous sulfate  325 mg Oral Q breakfast  . levothyroxine  75 mcg Oral QAC breakfast  . pantoprazole  40 mg Oral Daily  . pregabalin  25 mg Oral TID  . psyllium  1 packet Oral BID  . rOPINIRole  2 mg Oral QHS    Cardiac cath 05/11/13:  Left main: No obstructive disease.  Left Anterior Descending Artery: Large caliber vessel that courses to the apex. The proximal vessel has 20% stenosis. The mid vessel has a patent stented segment with no restenosis noted. The diagonal is patent. The mid to distal vessel has a focal 50% smooth stenosis which does not appear to be flow  limiting, unchanged in appearance from cath in 2013.  Circumflex Artery: Moderate sized vessel with proximal plaque, 40% smooth stenosis in the mid vessel. The first OM is small and patent. The AV groove Circumflex terminates into a moderate caliber second OM branch.  Right Coronary Artery: Large, dominant vessel with 30% mid stenosis.  Left Ventricular Angiogram: Deferred.  Impression:  1. Stable single vessel CAD with patent stent proximal to mid LAD  2. Mild to moderate non-obstructive disease in the distal LAD, distal Circumflex and mid RCA   Echo 05/12/13:  Left ventricle: The cavity size was normal. Wall thickness was normal. The estimated ejection fraction was 40%. Diffuse hypokinesis, worse towards the apex. Apex was not well-visualized, cannot rule out thrombus apical inferior wall. Doppler parameters are consistent with abnormal left ventricular relaxation (grade 1 diastolic dysfunction). - Aortic valve: There was no stenosis. Trivial regurgitation. - Mitral valve: Mildly calcified annulus. No significant regurgitation. - Left atrium: The atrium was mildly to moderately dilated. - Right ventricle: The cavity size was normal. Systolic function was normal. - Tricuspid valve: Peak RV-RA  gradient: 22mm Hg (S). - Pulmonary arteries: PA peak pressure: 27mm Hg (S). - Inferior vena cava: The vessel was normal in size; the respirophasic diameter changes were in the normal range (= 50%); findings are consistent with normal central venous pressure. - Pericardium, extracardiac: A trivial pericardial effusion was identified posterior to the heart. Impressions:  - Normal LV size with EF 40%. There was diffuse hypokinesis, worse towards the apex. Cannot rule out thrombus on apical inferior wall. Normal RV size and systolic function. No significant valvular abnormalities. I have asked the sonographers to repeat the study with Definity to rule in or out thrombus and to see the apex  better.  Definity used in f/u echo did not show LV thrombus.  ASSESSMENT AND PLAN:   1. CAD: Stable by cath 05/11/13. Patent LAD stent. No change in other disease. Mild elevation of troponin 0.14 at outside hospital but no culprit on cath. No recurrent chest pain. Will continue ASA/Plavix/beta blocker/statin.   2. Cardiomyopathy: LVEF 40-45% by echo yesterday. Definity used to exclude presence of LV thrombus. Continue beta blocker and Ace-inh.   3. HTN: BP still slightly elevated but long discussion with pt and she notes SBP 140-150 at home and does not wish to have it lower as she tends to get dizzy when it is lower. No changes.   4. Dispo: D/C home today. Follow up [facility] office.   Penny Arrambide  6/19/20147:19 AM

## 2013-06-03 ENCOUNTER — Encounter: Payer: Medicare Other | Admitting: Physician Assistant

## 2013-06-17 ENCOUNTER — Ambulatory Visit (INDEPENDENT_AMBULATORY_CARE_PROVIDER_SITE_OTHER): Payer: Medicare Other | Admitting: Physician Assistant

## 2013-06-17 ENCOUNTER — Encounter: Payer: Self-pay | Admitting: Physician Assistant

## 2013-06-17 VITALS — BP 146/74 | HR 63 | Ht 60.5 in | Wt 153.0 lb

## 2013-06-17 DIAGNOSIS — I2589 Other forms of chronic ischemic heart disease: Secondary | ICD-10-CM

## 2013-06-17 DIAGNOSIS — I255 Ischemic cardiomyopathy: Secondary | ICD-10-CM

## 2013-06-17 NOTE — Progress Notes (Signed)
Primary Cardiologist: Jerral Bonito, MD (new)   HPI: Post hospital followup from [facility], status post coronary angiography, following transfer from [facility].   She was referred to [place] in consultation for evaluation of chest pain with new LBBB, and previously documented CAD. Cardiac markers mildly elevated (0.14). We started her on IV heparin and arranged for transfer to [facility]. LVG was deferred, secondary to underlying CKD.   - Cardiac catheterization, June 17: Stable, 1v CAD with patent prox/mid LAD stent; mild/moderate nonobstructive distal LAD, distal CFX, and mid RCA disease  Initial echocardiogram yielded EF 40%, with question of apical thrombus. However, followup limited study with Definity was negative for such.  She presents today reporting no further CP. She also denies any symptoms suggestive of decompensated heart failure. She has since been advised to undergo treatment of cervical spine disease with an epidural injection at [facility], but will need to be off Plavix for 7 days.  She denies any complications of R groin incision site.         Allergies  Allergen Reactions  . Sulfa Antibiotics     unknown  . Sulfa Drugs Cross Reactors     unknown    Current Outpatient Prescriptions  Medication Sig Dispense Refill  . alendronate (FOSAMAX) 70 MG tablet Take 70 mg by mouth every 7 (seven) days. Take with a full glass of water on an empty stomach.      [name] amLODipine-benazepril (LOTREL) 5-20 MG per capsule Take 1 capsule by mouth daily.      [name] aspirin EC 81 MG EC tablet Take 1 tablet (81 mg total) by mouth daily.      [name] atorvastatin (LIPITOR) 20 MG tablet TAKE ONE TABLET BY MOUTH AT BEDTIME  30 tablet  5  . carvedilol (COREG) 6.25 MG tablet Take 6.25 mg by mouth 2 (two) times daily with a meal.      . Cholecalciferol (VITAMIN D-3) 1000 UNITS CAPS Take 4,000 Units by mouth daily.      . cycloSPORINE (RESTASIS) 0.05 % ophthalmic emulsion Place 1 drop into both eyes 2 (two) times daily.      . ferrous  sulfate 325 (65 FE) MG tablet Take 325 mg by mouth daily with breakfast.      . levothyroxine (SYNTHROID, LEVOTHROID) 75 MCG tablet Take 75 mcg by mouth daily.      . nitroGLYCERIN (NITROSTAT) 0.4 MG SL tablet Place 1 tablet (0.4 mg total) under the tongue every 5 (five) minutes as needed for chest pain (up to 3 doses).  25 tablet  4  . oxyCODONE-acetaminophen (PERCOCET/ROXICET) 5-325 MG per tablet Take 1 tablet by mouth every 4 (four) hours as needed for pain.      . pantoprazole (PROTONIX) 40 MG tablet Take 40 mg by mouth daily.      . pregabalin (LYRICA) 25 MG capsule Take 25 mg by mouth 2 (two) times daily.       . psyllium (REGULOID) 0.52 G capsule Take 1.04 g by mouth 2 (two) times daily.      [name] rOPINIRole (REQUIP) 2 MG tablet Take 2 mg by mouth at bedtime.      . vitamin B-12 (CYANOCOBALAMIN) 1000 MCG tablet Take 1,000 mcg by mouth daily.       No current facility-administered medications for this visit.    Past Medical History  Diagnosis Date  . Hypertension   . Hypothyroidism   . GERD (gastroesophageal reflux disease)   . H/O hiatal hernia   . Diverticulosis   .  CAD (coronary artery disease)     a. s/p PTCA/DES to LAD 07/23/12. b. Mildly elevated trop 04/2013 with stable cath, no culprit.  . Diastolic CHF     a. Prior normal EF 06/2013. b. EF 40-45% by echo 04/2013.  [name] Abnormal CT scan     a. Question of possible renal cyst in RUQ by CT scan 06/2012 at [facility]. b. Redemonstrated 04/2013, pt instructed to f/u PCP.  [name] Hypercholesteremia   . Asthma   . Iron deficiency anemia   . Arthritis     "all over" (05/11/2013)  . Chronic lower back pain   . LBBB (left bundle branch block)   . CKD (chronic kidney disease)   . Chronic venous insufficiency     Past Surgical History  Procedure Laterality Date  . Abdominal hysterectomy    . Total knee arthroplasty Right 2000  . Cholecystectomy    . Breast cyst excision Right 1970's  . Coronary angioplasty with stent placement  06/2012     DES proximal LAD [name] 05/11/2013  . Cardiac catheterization  05/11/2013  . Carpal tunnel release Right   . Cataract extraction w/ intraocular lens  implant, bilateral    . Bunionectomy Bilateral   . Hammer toe surgery Bilateral   . Toe surgery      "don't really remember which toe or which side; it was real crooked and had to be straightened" (05/11/2013)    History   Social History  . Marital Status: Married    Spouse Name: N/A    Number of Children: N/A  . Years of Education: N/A   Occupational History  . Not on file.   Social History Main Topics  . Smoking status: Never Smoker   . Smokeless tobacco: Never Used  . Alcohol Use: No  . Drug Use: No  . Sexually Active: No   Other Topics Concern  . Not on file   Social History Narrative  . No narrative on file    No family history on file.  ROS: no nausea, vomiting; no fever, chills; no melena, hematochezia; no claudication  PHYSICAL EXAM: BP 146/74  Pulse 63  Ht 5' 0.5" (1.537 m)  Wt 153 lb (69.4 kg)  BMI 29.38 kg/m2  SpO2 97% GENERAL: 79 year old female; NAD HEENT: NCAT, PERRLA, EOMI; sclera clear; no xanthelasma NECK: no obvious JVD LUNGS: CTA bilaterally CARDIAC: RRR (S1, S2); no significant murmurs; no rubs or gallops ABDOMEN: soft, non-tender; intact BS EXTREMETIES: no significant peripheral edema SKIN: warm/dry; no obvious rash/lesions MUSCULOSKELETAL: no joint deformity NEURO: no focal deficit; NL affect   EKG:    ASSESSMENT & PLAN:  Cardiomyopathy, ischemic Patient reports no further CP. Results of recent coronary angiogram were reviewed with the patient and her son. In light of these results, and given recent request for her to hold Plavix for 7 days prior to undergoing an epidural injection, I have instructed the patient that she may stop the Plavix altogether. She is now nearly exactly one year out, since undergoing DES of the LAD in August 2013. She is to remain on low-dose ASA, indefinitely.    Diastolic CHF Euvolemic by history and exam. Patient instructed to refrain from added salt in her diet, and to weigh herself daily. She is to contact our office for a weight gain of 3 lbs/24 hours. Of note, she has been instructed in the past to use Lasix prn.    Gene Ezriel Boffa, PAC

## 2013-06-17 NOTE — Assessment & Plan Note (Signed)
Euvolemic by history and exam. Patient instructed to refrain from added salt in her diet, and to weigh herself daily. She is to contact our office for a weight gain of 3 lbs/24 hours. Of note, she has been instructed in the past to use Lasix prn.

## 2013-06-17 NOTE — Assessment & Plan Note (Signed)
Patient reports no further CP. Results of recent coronary angiogram were reviewed with the patient and her son. In light of these results, and given recent request for her to hold Plavix for 7 days prior to undergoing an epidural injection, I have instructed the patient that she may stop the Plavix altogether. She is now nearly exactly one year out, since undergoing DES of the LAD in August 2013. She is to remain on low-dose ASA, indefinitely.

## 2013-06-17 NOTE — Patient Instructions (Addendum)
Your physician wants you to follow-up in: 6 MONTHS WITH DR. Dominic Pea will receive a reminder letter in the mail two months in advance. If you don't receive a letter, please call our office to schedule the follow-up appointment.  Your physician has recommended you make the following change in your medication:   STOP PLAVIX  CONTINUE WITH ALL OTHER MEDICATIONS  PROVIDER WOULD LIKE FOR YOU TO WEIGH YOURSELF DAILY AND BRING IT TO NEXT OFFICE VISIT  ADD NO SALT TO YOUR FOOD

## 2013-08-29 ENCOUNTER — Other Ambulatory Visit: Payer: Self-pay | Admitting: Cardiovascular Disease

## 2013-12-08 ENCOUNTER — Ambulatory Visit (INDEPENDENT_AMBULATORY_CARE_PROVIDER_SITE_OTHER): Payer: Medicare Other | Admitting: Cardiology

## 2013-12-08 ENCOUNTER — Ambulatory Visit: Payer: Medicare Other | Admitting: Cardiology

## 2013-12-08 VITALS — BP 183/81 | HR 65 | Ht 60.0 in | Wt 154.1 lb

## 2013-12-08 DIAGNOSIS — I255 Ischemic cardiomyopathy: Secondary | ICD-10-CM

## 2013-12-08 DIAGNOSIS — I2510 Atherosclerotic heart disease of native coronary artery without angina pectoris: Secondary | ICD-10-CM

## 2013-12-08 DIAGNOSIS — I10 Essential (primary) hypertension: Secondary | ICD-10-CM

## 2013-12-08 DIAGNOSIS — I2589 Other forms of chronic ischemic heart disease: Secondary | ICD-10-CM

## 2013-12-08 DIAGNOSIS — E785 Hyperlipidemia, unspecified: Secondary | ICD-10-CM

## 2013-12-08 NOTE — Progress Notes (Signed)
Clinical Summary Ana Pena is a 80 y.o.female last seen by PA Ana Pena, this is our first visit together. She was seen for the following medical problems.  1. CAD/ICM - history of prior DES to LAD in 06/2012 - cath 04/2013 with patent LAD stent - LVEF 40-45% by echo 04/2013 - no recent chest pain. Notes some occasional DOE with walking up stairs which is stable, can walk up 2 flights of stair without troubles. No orthopnea, occas mild LE edema.  - compliant with meds:  - has had some troubles with dizziness at home thought possible related to blood pressure, it appears this is why her regimen has not been intensified. Symptoms occur with standing.   2. HTN - checks at home occasionally, but recently - compliant with meds  3. Hyperlipidemia - compliant with statin - 04/2013 TC 94 TG 109 HDL 31 LDL 41   Past Medical History  Diagnosis Date  . Hypertension   . Hypothyroidism   . GERD (gastroesophageal reflux disease)   . H/O hiatal hernia   . Diverticulosis   . CAD (coronary artery disease)     a. s/p PTCA/DES to LAD 07/23/12. b. Mildly elevated trop 04/2013 with stable cath, no culprit.  . Diastolic CHF     a. Prior normal EF 06/2013. b. EF 40-45% by echo 04/2013.  [name] Abnormal CT scan     a. Question of possible renal cyst in RUQ by CT scan 06/2012 at [facility]. b. Redemonstrated 04/2013, pt instructed to f/u PCP.  [name] Hypercholesteremia   . Asthma   . Iron deficiency anemia   . Arthritis     "all over" (05/11/2013)  . Chronic lower back pain   . LBBB (left bundle Ana Pena block)   . CKD (chronic kidney disease)   . Chronic venous insufficiency      Allergies  Allergen Reactions  . Sulfa Antibiotics     unknown  . Sulfa Drugs Cross Reactors     unknown     Current Outpatient Prescriptions  Medication Sig Dispense Refill  . alendronate (FOSAMAX) 70 MG tablet Take 70 mg by mouth every 7 (seven) days. Take with a full glass of water on an empty stomach.      [name]  amLODipine-benazepril (LOTREL) 5-20 MG per capsule Take 1 capsule by mouth daily.      [name] atorvastatin (LIPITOR) 20 MG tablet TAKE ONE TABLET BY MOUTH AT BEDTIME  30 tablet  5  . carvedilol (COREG) 6.25 MG tablet Take 6.25 mg by mouth 2 (two) times daily with a meal.      . Cholecalciferol (VITAMIN D-3) 1000 UNITS CAPS Take 4,000 Units by mouth daily.      . cycloSPORINE (RESTASIS) 0.05 % ophthalmic emulsion Place 1 drop into both eyes 2 (two) times daily.      . ferrous sulfate 325 (65 FE) MG tablet Take 325 mg by mouth daily with breakfast.      . levothyroxine (SYNTHROID, LEVOTHROID) 75 MCG tablet Take 75 mcg by mouth daily.      . nitroGLYCERIN (NITROSTAT) 0.4 MG SL tablet Place 1 tablet (0.4 mg total) under the tongue every 5 (five) minutes as needed for chest pain (up to 3 doses).  25 tablet  4  . oxyCODONE-acetaminophen (PERCOCET/ROXICET) 5-325 MG per tablet Take 1 tablet by mouth every 4 (four) hours as needed for pain.      . pantoprazole (PROTONIX) 40 MG tablet Take 40 mg by mouth daily.      [name]  pregabalin (LYRICA) 25 MG capsule Take 25 mg by mouth 2 (two) times daily.       . psyllium (REGULOID) 0.52 G capsule Take 1.04 g by mouth 2 (two) times daily.      [name] rOPINIRole (REQUIP) 2 MG tablet Take 2 mg by mouth at bedtime.      . vitamin B-12 (CYANOCOBALAMIN) 1000 MCG tablet Take 1,000 mcg by mouth daily.       No current facility-administered medications for this visit.     Past Surgical History  Procedure Laterality Date  . Abdominal hysterectomy    . Total knee arthroplasty Right 2000  . Cholecystectomy    . Breast cyst excision Right 1970's  . Coronary angioplasty with stent placement  06/2012    DES proximal LAD Ana Pena 05/11/2013  . Cardiac catheterization  05/11/2013  . Carpal tunnel release Right   . Cataract extraction w/ intraocular lens  implant, bilateral    . Bunionectomy Bilateral   . Hammer toe surgery Bilateral   . Toe surgery      "don't really remember which toe  or which side; it was real crooked and had to be straightened" (05/11/2013)     Allergies  Allergen Reactions  . Sulfa Antibiotics     unknown  . Sulfa Drugs Cross Reactors     unknown      No family history on file.   Social History Ms. Ana Pena reports that she has never smoked. She has never used smokeless tobacco. Ms. Ana Pena reports that she does not drink alcohol.   Review of Systems CONSTITUTIONAL: No weight loss, fever, chills, weakness or fatigue.  HEENT: Eyes: No visual loss, blurred vision, double vision or yellow sclerae.No hearing loss, sneezing, congestion, runny nose or sore throat.  SKIN: No rash or itching.  CARDIOVASCULAR:per HPI  RESPIRATORY: No shortness of breath, cough or sputum.  GASTROINTESTINAL: No anorexia, nausea, vomiting or diarrhea. No abdominal pain or blood.  GENITOURINARY: No burning on urination, no polyuria NEUROLOGICAL: neck pain MUSCULOSKELETAL: No muscle, back pain, joint pain or stiffness.  LYMPHATICS: No enlarged nodes. No history of splenectomy.  PSYCHIATRIC: No history of depression or anxiety.  ENDOCRINOLOGIC: No reports of sweating, cold or heat intolerance. No polyuria or polydipsia.  [name]   Physical Examination p 65 bp 180/90 Wt 154 lbs BMI 30 Gen: resting comfortably, no acute distress HEENT: no scleral icterus, pupils equal round and reactive, no palptable cervical adenopathy,  CV: RRR, fixed split S2, no m/r/g, no JVD Resp: Clear to auscultation bilaterally GI: abdomen is soft, non-tender, non-distended, normal bowel sounds, no hepatosplenomegaly MSK: extremities are warm, 1+ left leg edema (chronic) Skin: warm, no rash Neuro:  no focal deficits Psych: appropriate affect   Diagnostic Studies 04/2013 Cath Hemodynamic Findings:  Central aortic pressure: 161/60  Left ventricular pressure: 159/8/13  Angiographic Findings:  Left main: No obstructive disease.  Left Anterior Descending Artery: Large caliber vessel that courses  to the apex. The proximal vessel has 20% stenosis. The mid vessel has a patent stented segment with no restenosis noted. The diagonal is patent. The mid to distal vessel has a focal 50% smooth stenosis which does not appear to be flow limiting, unchanged in appearance from cath in 2013.  Circumflex Artery: Moderate sized vessel with proximal plaque, 40% smooth stenosis in the mid vessel. The first OM is small and patent. The AV groove Circumflex terminates into a moderate caliber second OM Ana Pena.  Right Coronary Artery: Large, dominant vessel with 30% mid  stenosis.  Left Ventricular Angiogram: Deferred.  Impression:  1. Stable single vessel CAD with patent stent proximal to mid LAD  2. Mild to moderate non-obstructive disease in the distal LAD, distal Circumflex and mid RCA  Recommendations: Continue medical management. Her d-dimer is slightly elevated but [facility]. Will monitor overnight.   04/2013 Echo LVEF 40-45%, grade I diastolic dysfunction,    Assessment and Plan  1. CAD/ICM - LVEF 40-45% by echo 04/2013, she is NYHA I-II. Last cath 04/2013 showed patent LAD stent, non-obstructive disease - she was previously on ACE-I, unclear why stopped. Patient thinks her PCP stopped it, she is not sure why. Will request most recent clinic notes from Dr Sherril CroonVyas - continue current medications  2. HTN - elevated in clinic today, she was previously on amlodopine/benazipril combo that was stopped.  Will request PCP notes to see indication, she has had some orthostatic symptoms, this may have been the reason. Notes no change in symptoms since medication was stopped.  - will consider adding back low dose ACE-I once notes reviewed  3. Hyperlipidemia - cholesterol at goal, continue current meds  4. Preoperative evaluation - patient is being considered for an intermediate risk procedure by neurosurgery on her neck. She has no active acute cardiac conditions. Recent cath  showed non-obstructive disease. She tolerates >4 METs regularly without limitation. Recommend proceeding with surgery as planned, ok to hold aspirin as needed for surgical procedure.    Follow up 3 months   Antoine PocheJonathan F. Cherlyn Syring, M.D., F.A.C.C.

## 2013-12-08 NOTE — Patient Instructions (Signed)
Your physician recommends that you schedule a follow-up appointment in: 3 months with Dr. Wyline MoodBranch. You should receive a letter in the mail in 1-2 months. If you do not receive this letter by February / March 2015 call our office to schedule this appointment.   Your physician recommends that you continue on your current medications as directed. Please refer to the Current Medication list given to you today.  Your physician has said it is ok to have surgery.

## 2013-12-15 ENCOUNTER — Ambulatory Visit: Payer: Medicare Other | Admitting: Cardiology

## 2013-12-21 NOTE — Telephone Encounter (Signed)
No notes

## 2013-12-27 ENCOUNTER — Telehealth: Payer: Self-pay | Admitting: Cardiology

## 2013-12-27 NOTE — Telephone Encounter (Signed)
We noted her bp was elevated in last clinic visit and that Dr Sherril CroonVyas had recently stopper some of her bp meds, I was not clear of the reason. It would be best to contact Dr Sherril CroonVyas, as he would know better about stopping those medications and if its ok to restart.   Dina RichJonathan Kazmir Oki MD

## 2013-12-28 NOTE — Telephone Encounter (Signed)
Notified Marthe PatchRhonda Cousins (daughter), verbalized understanding.

## 2014-03-08 ENCOUNTER — Ambulatory Visit (INDEPENDENT_AMBULATORY_CARE_PROVIDER_SITE_OTHER): Payer: Medicare Other | Admitting: Cardiology

## 2014-03-08 ENCOUNTER — Encounter: Payer: Self-pay | Admitting: Cardiology

## 2014-03-08 VITALS — BP 162/72 | HR 58 | Ht 60.0 in | Wt 153.1 lb

## 2014-03-08 DIAGNOSIS — I5022 Chronic systolic (congestive) heart failure: Secondary | ICD-10-CM

## 2014-03-08 DIAGNOSIS — I10 Essential (primary) hypertension: Secondary | ICD-10-CM

## 2014-03-08 DIAGNOSIS — E785 Hyperlipidemia, unspecified: Secondary | ICD-10-CM

## 2014-03-08 DIAGNOSIS — I2510 Atherosclerotic heart disease of native coronary artery without angina pectoris: Secondary | ICD-10-CM

## 2014-03-08 MED ORDER — FUROSEMIDE 20 MG PO TABS: 20.0000 mg | ORAL_TABLET | ORAL | Status: DC | PRN

## 2014-03-08 NOTE — Patient Instructions (Signed)
Your physician recommends that you schedule a follow-up appointment in: 3 months with Dr. Wyline MoodBranch. You should receive a letter in the mail in 1-2 months. If you do not receive this letter by May or June 2015 call our office to schedule this appointment.   Your physician has recommended you make the following change in your medication:  Start: Furosemide (Lasix) 20 MG take 1 tablet once daily by mouth as needed for swelling  Continue all other medications the same.   Your physician has requested you monitor and record your blood pressure for 2 weeks and report readings to our office when completed. You may submit readings via mail, phone, or bring into office.

## 2014-03-08 NOTE — Progress Notes (Signed)
Clinical Summary Ms. Ana Pena is a 80 y.o.female seen today for follow up of the following medical problems.   1. CAD/ICM  - history of prior DES to LAD in 06/2012  - cath 04/2013 with patent LAD stent  - LVEF 40-45% by echo 04/2013  - no recent chest pain.  Denies any DOE, she is increasing her outdoor walking. No orthopnea, + LE edema.  - compliant with meds - limiting sodium, avoiding NSAIDs  2. HTN  - checks bp at home, SBP in 150s, though can be low in 90s - compliant with meds   3. Hyperlipidemia  - compliant with statin  - 04/2013 TC 94 TG 109 HDL 31 LDL 41 - states she has a repeat panel coming up in August   Past Medical History  Diagnosis Date  . Hypertension   . Hypothyroidism   . GERD (gastroesophageal reflux disease)   . H/O hiatal hernia   . Diverticulosis   . CAD (coronary artery disease)     a. s/p PTCA/DES to LAD 07/23/12. b. Mildly elevated trop 04/2013 with stable cath, no culprit.  . Diastolic CHF     a. Prior normal EF 06/2013. b. EF 40-45% by echo 04/2013.  [name]. Abnormal CT scan     a. Question [facility] possible renal cyst in RUQ [facility]. b. Redemonstrated 04/2013, pt instructed to f/u PCP.  [name]. Hypercholesteremia   . Asthma   . Iron deficiency anemia   . Arthritis     "all over" (05/11/2013)  . Chronic lower back pain   . LBBB (left bundle Ana Pena block)   . CKD (chronic kidney disease)   . Chronic venous insufficiency      Allergies  Allergen Reactions  . Sulfa Antibiotics     unknown  . Sulfa Drugs Cross Reactors     unknown     Current Outpatient Prescriptions  Medication Sig Dispense Refill  . alendronate (FOSAMAX) 70 MG tablet Take 70 mg by mouth every 7 (seven) days. Take with a full glass of water on an empty stomach.      [name]. aspirin 81 MG tablet Take 81 mg by mouth daily.      [name]. atorvastatin (LIPITOR) 20 MG tablet Take 20 mg by mouth every evening.      . carvedilol (COREG) 6.25 MG tablet Take 6.25 mg by mouth 2 (two)  times daily with a meal.      . Cholecalciferol (VITAMIN D-3) 1000 UNITS CAPS Take 4,000 Units by mouth daily.      . cycloSPORINE (RESTASIS) 0.05 % ophthalmic emulsion Place 1 drop into both eyes 2 (two) times daily.      . ferrous sulfate 325 (65 FE) MG tablet Take 325 mg by mouth daily with breakfast.      . levothyroxine (SYNTHROID, LEVOTHROID) 75 MCG tablet Take 75 mcg by mouth daily.      . nitroGLYCERIN (NITROSTAT) 0.4 MG SL tablet Place 0.4 mg under the tongue every 5 (five) minutes as needed for chest pain (up to 3 doses).      [name]. oxyCODONE-acetaminophen (PERCOCET/ROXICET) 5-325 MG per tablet Take 1 tablet by mouth every 4 (four) hours as needed for pain.      . pantoprazole (PROTONIX) 40 MG tablet Take 40 mg by mouth daily.      . pregabalin (LYRICA) 50 MG capsule Take 50 mg by mouth 2 (two) times daily.      . psyllium (REGULOID)  0.52 G capsule Take 1.04 g by mouth 2 (two) times daily.      [name] rOPINIRole (REQUIP) 2 MG tablet Take 2 mg by mouth at bedtime.      . vitamin B-12 (CYANOCOBALAMIN) 1000 MCG tablet Take 1,000 mcg by mouth daily.       No current facility-administered medications for this visit.     Past Surgical History  Procedure Laterality Date  . Abdominal hysterectomy    . Total knee arthroplasty Right 2000  . Cholecystectomy    . Breast cyst excision Right 1970's  . Coronary angioplasty with stent placement  06/2012    DES proximal LAD Ana Pena 05/11/2013  . Cardiac catheterization  05/11/2013  . Carpal tunnel release Right   . Cataract extraction w/ intraocular lens  implant, bilateral    . Bunionectomy Bilateral   . Hammer toe surgery Bilateral   . Toe surgery      "don't really remember which toe or which side; it was real crooked and had to be straightened" (05/11/2013)     Allergies  Allergen Reactions  . Sulfa Antibiotics     unknown  . Sulfa Drugs Cross Reactors     unknown      No family history on file.   Social History Ana Pena reports that  she has never smoked. She has never used smokeless tobacco. Ana Pena reports that she does not drink alcohol.   Review of Systems CONSTITUTIONAL: No weight loss, fever, chills, weakness or fatigue.  HEENT: Eyes: No visual loss, blurred vision, double vision or yellow sclerae.No hearing loss, sneezing, congestion, runny nose or sore throat.  SKIN: No rash or itching.  CARDIOVASCULAR: per HPI RESPIRATORY: No shortness of breath, cough or sputum.  GASTROINTESTINAL: No anorexia, nausea, vomiting or diarrhea. No abdominal pain or blood.  GENITOURINARY: No burning on urination, no polyuria NEUROLOGICAL: No headache, dizziness, syncope, paralysis, ataxia, numbness or tingling in the extremities. No change in bowel or bladder control.  MUSCULOSKELETAL: No muscle, back pain, joint pain or stiffness.  LYMPHATICS: No enlarged nodes. No history of splenectomy.  PSYCHIATRIC: No history of depression or anxiety.  ENDOCRINOLOGIC: No reports of sweating, cold or heat intolerance. No polyuria or polydipsia.  [name]   Physical Examination p 58 bp 162/72 Wt 153 lbs BMI 30 Gen: resting comfortably, no acute distress HEENT: no scleral icterus, pupils equal round and reactive, no palptable cervical adenopathy,  CV: RRR, no m/r/g, no JVD, no carotid bruits Resp: Clear to auscultation bilaterally GI: abdomen is soft, non-tender, non-distended, normal bowel sounds, no hepatosplenomegaly MSK: extremities are warm, 1+ bilateral edema Skin: warm, no rash Neuro:  no focal deficits Psych: appropriate affect   Diagnostic Studies 04/2013 Cath  Hemodynamic Findings:  Central aortic pressure: 161/60  Left ventricular pressure: 159/8/13  Angiographic Findings:  Left main: No obstructive disease.  Left Anterior Descending Artery: Large caliber vessel that courses to the apex. The proximal vessel has 20% stenosis. The mid vessel has a patent stented segment with no restenosis noted. The diagonal is patent. The mid to  distal vessel has a focal 50% smooth stenosis which does not appear to be flow limiting, unchanged in appearance from cath in 2013.  Circumflex Artery: Moderate sized vessel with proximal plaque, 40% smooth stenosis in the mid vessel. The first OM is small and patent. The AV groove Circumflex terminates into a moderate caliber second OM Ana Pena.  Right Coronary Artery: Large, dominant vessel with 30% mid stenosis.  Left Ventricular Angiogram: Deferred.  Impression:  1. Stable single vessel CAD with patent stent proximal to mid LAD  2. Mild to moderate non-obstructive disease in the distal LAD, distal Circumflex and mid RCA  Recommendations: Continue medical management. Her d-dimer is slightly elevated but [facility]. Will monitor overnight.   04/2013 Echo  LVEF 40-45%, grade I diastolic dysfunction,      Assessment and Plan   1. CAD/ICM  - LVEF 40-45% by echo 04/2013, she is NYHA I-II. Last cath 04/2013 showed patent LAD stent, non-obstructive disease  - has evidence of worsening LE edema, will start prn lasix.  - continue current medications   2. HTN  - her reported home numbers seem to be up and down, I have asked her to keep a log and bring by office in 2 weeks - continue current meds for now  3. Hyperlipidemia  - cholesterol at goal, continue current meds     F/u 3 months     Ana Pena, M.D., F.A.C.C.

## 2014-03-24 ENCOUNTER — Telehealth: Payer: Self-pay | Admitting: Cardiology

## 2014-03-24 NOTE — Telephone Encounter (Signed)
Message copied by Melvern SampleATES-LAND, Ruben Mahler M on Thu Mar 24, 2014 10:38 AM ------      Message from: Melvern SampleATES-LAND, Nigil Braman M      Created: Fri Mar 11, 2014  9:28 AM      Regarding: Dr. Wyline MoodBranch FU       BP log x2 weeks ------

## 2014-03-24 NOTE — Telephone Encounter (Signed)
Called pt and she stated that she will be bringing BP log readings to office today 03-24-2014.

## 2014-04-14 ENCOUNTER — Telehealth: Payer: Self-pay | Admitting: *Deleted

## 2014-04-14 NOTE — Telephone Encounter (Signed)
Message copied by Eustace MooreANDERSON, Zyiere Rosemond M on Thu Apr 14, 2014  2:28 PM ------      Message from: Dina RichBRANCH, JONATHAN F      Created: Tue Mar 29, 2014  9:09 AM       Please let patient know that bp log reviewed. BP's are up and down, between 110s-160s/50-70s, with occasional systolics in the 90s. I'm more concerned about dropping her bp too low than her being somewhat elevated, we will not change any meds at this time            Dominga FerryJ Branch MD ------

## 2014-04-27 NOTE — Telephone Encounter (Signed)
Patient informed and verbalized understanding of plan. 

## 2014-06-16 ENCOUNTER — Encounter: Payer: Self-pay | Admitting: Cardiology

## 2014-06-16 ENCOUNTER — Ambulatory Visit (INDEPENDENT_AMBULATORY_CARE_PROVIDER_SITE_OTHER): Payer: Medicare Other | Admitting: Cardiology

## 2014-06-16 VITALS — BP 165/77 | HR 65 | Ht 60.0 in | Wt 157.0 lb

## 2014-06-16 DIAGNOSIS — E785 Hyperlipidemia, unspecified: Secondary | ICD-10-CM

## 2014-06-16 DIAGNOSIS — I10 Essential (primary) hypertension: Secondary | ICD-10-CM

## 2014-06-16 DIAGNOSIS — I5022 Chronic systolic (congestive) heart failure: Secondary | ICD-10-CM

## 2014-06-16 DIAGNOSIS — I2510 Atherosclerotic heart disease of native coronary artery without angina pectoris: Secondary | ICD-10-CM

## 2014-06-16 NOTE — Addendum Note (Signed)
Addended by: Jerrye BeaversJONES, Elody Kleinsasser on: 06/16/2014 10:43 AM   Modules accepted: Orders

## 2014-06-16 NOTE — Patient Instructions (Addendum)

## 2014-06-16 NOTE — Progress Notes (Signed)
Clinical Summary Ana Pena is a 80 y.o.female seen today for follow up of the following medical problems.   1. CAD/ICM  - history of prior DES to LAD in 06/2012  - cath 04/2013 with patent LAD stent  - LVEF 40-45% by echo 04/2013   - no recent chest pain. DOE at 1/2 block which is stable. Has had some LE edema at times. No orthopnea, no PND - compliant with meds  - limiting sodium, avoiding NSAIDs   2. HTN  - checks bp at home, systolics typically in 140s  - submitted log after last visit, ranged from 110-160/50-70 with rare  SBP in 90s.   3. Hyperlipidemia  - compliant with statin  - 04/2013 TC 94 TG 109 HDL 31 LDL 41  - states she has a repeat panel coming up in August, 2015  Past Medical History  Diagnosis Date  . Hypertension   . Hypothyroidism   . GERD (gastroesophageal reflux disease)   . H/O hiatal hernia   . Diverticulosis   . CAD (coronary artery disease)     a. s/p PTCA/DES to LAD 07/23/12. b. Mildly elevated trop 04/2013 with stable cath, no culprit.  . Diastolic CHF     a. Prior normal EF 06/2013. b. EF 40-45% by echo 04/2013.  [name] Abnormal CT scan     a. Question of possible renal cyst in RUQ by CT scan 06/2012 at [facility]. b. Redemonstrated 04/2013, pt instructed to f/u PCP.  [name] Hypercholesteremia   . Asthma   . Iron deficiency anemia   . Arthritis     "all over" (05/11/2013)  . Chronic lower back pain   . LBBB (left bundle Brolin Dambrosia block)   . CKD (chronic kidney disease)   . Chronic venous insufficiency      Allergies  Allergen Reactions  . Sulfa Antibiotics     unknown  . Sulfa Drugs Cross Reactors     unknown     Current Outpatient Prescriptions  Medication Sig Dispense Refill  . alendronate (FOSAMAX) 70 MG tablet Take 70 mg by mouth every 7 (seven) days. Take with a full glass of water on an empty stomach.      [name] amLODipine (NORVASC) 5 MG tablet Take 5 mg by mouth daily.       [name] aspirin 81 MG tablet Take 81 mg by mouth daily.      [name] atorvastatin  (LIPITOR) 20 MG tablet Take 20 mg by mouth every evening.      . benazepril (LOTENSIN) 20 MG tablet Take 20 mg by mouth daily.       . carvedilol (COREG) 12.5 MG tablet Take 12.5 mg by mouth 2 (two) times daily with a meal.      . Cholecalciferol (VITAMIN D-3) 1000 UNITS CAPS Take 4,000 Units by mouth daily.      . cycloSPORINE (RESTASIS) 0.05 % ophthalmic emulsion Place 1 drop into both eyes 2 (two) times daily.      . ferrous sulfate 325 (65 FE) MG tablet Take 325 mg by mouth daily with breakfast.      . furosemide (LASIX) 20 MG tablet Take 1 tablet (20 mg total) by mouth as needed (Swelling).  30 tablet  6  . levothyroxine (SYNTHROID, LEVOTHROID) 75 MCG tablet Take 75 mcg by mouth daily.      [name] loperamide (IMODIUM A-D) 2 MG tablet Take 0.5 tablets by mouth as needed.      . nitroGLYCERIN (NITROSTAT) 0.4 MG  SL tablet Place 0.4 mg under the tongue every 5 (five) minutes as needed for chest pain (up to 3 doses).      [name]. oxyCODONE-acetaminophen (PERCOCET/ROXICET) 5-325 MG per tablet Take 1 tablet by mouth every 4 (four) hours as needed for pain.      . pantoprazole (PROTONIX) 40 MG tablet Take 40 mg by mouth daily.      . pregabalin (LYRICA) 50 MG capsule Take 50 mg by mouth 2 (two) times daily.      . psyllium (REGULOID) 0.52 G capsule Take 1.04 g by mouth 2 (two) times daily.      [name]. rOPINIRole (REQUIP) 2 MG tablet Take 2 mg by mouth at bedtime.      . vitamin B-12 (CYANOCOBALAMIN) 1000 MCG tablet Take 1,000 mcg by mouth daily.       No current facility-administered medications for this visit.     Past Surgical History  Procedure Laterality Date  . Abdominal hysterectomy    . Total knee arthroplasty Right 2000  . Cholecystectomy    . Breast cyst excision Right 1970's  . Coronary angioplasty with stent placement  06/2012    DES proximal LAD Ana Pena 05/11/2013  . Cardiac catheterization  05/11/2013  . Carpal tunnel release Right   . Cataract extraction w/ intraocular lens  implant, bilateral     . Bunionectomy Bilateral   . Hammer toe surgery Bilateral   . Toe surgery      "don't really remember which toe or which side; it was real crooked and had to be straightened" (05/11/2013)     Allergies  Allergen Reactions  . Sulfa Antibiotics     unknown  . Sulfa Drugs Cross Reactors     unknown      No family history on file.   Social History Ana Pena reports that she has never smoked. She has never used smokeless tobacco. Ana Pena reports that she does not drink alcohol.   Review of Systems CONSTITUTIONAL: No weight loss, fever, chills, weakness or fatigue.  HEENT: Eyes: No visual loss, blurred vision, double vision or yellow sclerae.No hearing loss, sneezing, congestion, runny nose or sore throat.  SKIN: No rash or itching.  CARDIOVASCULAR: per HPI RESPIRATORY: No shortness of breath, cough or sputum.  GASTROINTESTINAL: No anorexia, nausea, vomiting or diarrhea. No abdominal pain or blood.  GENITOURINARY: No burning on urination, no polyuria NEUROLOGICAL: No headache, dizziness, syncope, paralysis, ataxia, numbness or tingling in the extremities. No change in bowel or bladder control.  MUSCULOSKELETAL: +LE edema LYMPHATICS: No enlarged nodes. No history of splenectomy.  PSYCHIATRIC: No history of depression or anxiety.  ENDOCRINOLOGIC: No reports of sweating, cold or heat intolerance. No polyuria or polydipsia.  [name].   Physical Examination p 65 bp 165/77 Wt 157 lbs BMI 31 Gen: resting comfortably, no acute distress HEENT: no scleral icterus, pupils equal round and reactive, no palptable cervical adenopathy,  CV: RRR, no m/r/g,no JVD, no carotid bruits Resp: Clear to auscultation bilaterally GI: abdomen is soft, non-tender, non-distended, normal bowel sounds, no hepatosplenomegaly MSK: extremities are warm, 2+ bilateral LE edema Skin: warm, no rash Neuro:  no focal deficits Psych: appropriate affect   Diagnostic Studies 04/2013 Cath  Hemodynamic Findings:    Central aortic pressure: 161/60  Left ventricular pressure: 159/8/13  Angiographic Findings:  Left main: No obstructive disease.  Left Anterior Descending Artery: Large caliber vessel that courses to the apex. The proximal vessel has 20% stenosis. The mid vessel has a patent stented segment  with no restenosis noted. The diagonal is patent. The mid to distal vessel has a focal 50% smooth stenosis which does not appear to be flow limiting, unchanged in appearance from cath in 2013.  Circumflex Artery: Moderate sized vessel with proximal plaque, 40% smooth stenosis in the mid vessel. The first OM is small and patent. The AV groove Circumflex terminates into a moderate caliber second OM Ana Pena.  Right Coronary Artery: Large, dominant vessel with 30% mid stenosis.  Left Ventricular Angiogram: Deferred.  Impression:  1. Stable single vessel CAD with patent stent proximal to mid LAD  2. Mild to moderate non-obstructive disease in the distal LAD, distal Circumflex and mid RCA  Recommendations: Continue medical management. Her d-dimer is slightly elevated but CTA chest at [facility] without evidence of PE. Will monitor overnight.   04/2013 Echo  LVEF 40-45%, grade I diastolic dysfunction,      Assessment and Plan  1. CAD/ICM/Chronic systolic heart failure - LVEF 08-65% by echo 04/2013, she is NYHA I-II. Last cath 04/2013 showed patent LAD stent, non-obstructive disease  - fairly significant LE edema. She is not taking her prn lasix that regularly, encouraged to take more often. Educated ok to take extra on days where swelling has worsened.  - continue current medications   2. HTN  - elevated today, however has not taken her meds yet - recent bp log showed overall good numbers, with a systolic bp target of 150 given her age. Avoiding over aggressive treatment as she does have the occasional low SBP in the mid 90s - continue current meds  3. Hyperlipidemia  - cholesterol at goal, continue current  meds    F/u 6 months    Antoine Poche, M.D., F.A.C.C.

## 2014-07-27 ENCOUNTER — Encounter (INDEPENDENT_AMBULATORY_CARE_PROVIDER_SITE_OTHER): Payer: Medicare Other | Admitting: Ophthalmology

## 2014-07-27 DIAGNOSIS — H43819 Vitreous degeneration, unspecified eye: Secondary | ICD-10-CM

## 2014-07-27 DIAGNOSIS — H35039 Hypertensive retinopathy, unspecified eye: Secondary | ICD-10-CM

## 2014-07-27 DIAGNOSIS — I10 Essential (primary) hypertension: Secondary | ICD-10-CM

## 2014-09-29 ENCOUNTER — Encounter: Payer: Self-pay | Admitting: Cardiology

## 2014-11-03 ENCOUNTER — Encounter (HOSPITAL_COMMUNITY): Payer: Self-pay | Admitting: Cardiovascular Disease

## 2014-12-08 ENCOUNTER — Encounter: Payer: Self-pay | Admitting: Cardiology

## 2014-12-08 ENCOUNTER — Ambulatory Visit (INDEPENDENT_AMBULATORY_CARE_PROVIDER_SITE_OTHER): Payer: Medicare Other | Admitting: Cardiology

## 2014-12-08 VITALS — BP 173/85 | HR 77 | Ht 60.0 in | Wt 155.0 lb

## 2014-12-08 DIAGNOSIS — I10 Essential (primary) hypertension: Secondary | ICD-10-CM

## 2014-12-08 DIAGNOSIS — E785 Hyperlipidemia, unspecified: Secondary | ICD-10-CM

## 2014-12-08 DIAGNOSIS — I5022 Chronic systolic (congestive) heart failure: Secondary | ICD-10-CM

## 2014-12-08 DIAGNOSIS — I2510 Atherosclerotic heart disease of native coronary artery without angina pectoris: Secondary | ICD-10-CM

## 2014-12-08 MED ORDER — AMLODIPINE BESYLATE 10 MG PO TABS: 10.0000 mg | ORAL_TABLET | Freq: Every day | ORAL | Status: DC

## 2014-12-08 NOTE — Progress Notes (Signed)
Clinical Summary Ms. Mayer Camelatum is a 81 y.o.female seen today for follow up of the following medical problems.   1. CAD/ICM  - history of prior DES to LAD in 06/2012  - cath 04/2013 with patent LAD stent  - LVEF 40-45% by echo 04/2013   - swelling has improved since last visit. Has had some DOE, stable at 1/2 block. No orthopnea - no chest pain. - limiting salt intake. Avoiding NSAIDs.    2. HTN  - checks bp at home, systolics typically in 160/80s. - compliant with meds  3. Hyperlipidemia  - compliant with statin, followed by pcp    Past Medical History  Diagnosis Date  . Hypertension   . Hypothyroidism   . GERD (gastroesophageal reflux disease)   . H/O hiatal hernia   . Diverticulosis   . CAD (coronary artery disease)     a. s/p PTCA/DES to LAD 07/23/12. b. Mildly elevated trop 04/2013 with stable cath, no culprit.  . Diastolic CHF     a. Prior normal EF 06/2013. b. EF 40-45% by echo 04/2013.  [name]. Abnormal CT scan     a. [facility] possible renal [facility] in RU[facility] b. Redemonstrated 04/2013, pt instructed to f/u PCP.  [name]. Hypercholesteremia   . Asthma   . Iron deficiency anemia   . Arthritis     "all over" (05/11/2013)  . Chronic lower back pain   . LBBB (left bundle Taiven Greenley block)   . CKD (chronic kidney disease)   . Chronic venous insufficiency      Allergies  Allergen Reactions  . Morphine And Related Nausea Only  . Sulfa Antibiotics     unknown  . Sulfa Drugs Cross Reactors     unknown     Current Outpatient Prescriptions  Medication Sig Dispense Refill  . alendronate (FOSAMAX) 70 MG tablet Take 70 mg by mouth every 7 (seven) days. Take with a full glass of water on an empty stomach.    [name]. amLODipine (NORVASC) 5 MG tablet Take 5 mg by mouth daily.     [name]. aspirin 81 MG tablet Take 81 mg by mouth daily.    [name]. atorvastatin (LIPITOR) 20 MG tablet Take 20 mg by mouth every evening.    . benazepril (LOTENSIN) 20 MG tablet Take 20 mg by  mouth daily.     . carvedilol (COREG) 12.5 MG tablet Take 12.5 mg by mouth 2 (two) times daily with a meal.    . Cholecalciferol (VITAMIN D-3) 1000 UNITS CAPS Take 4,000 Units by mouth daily.    . cycloSPORINE (RESTASIS) 0.05 % ophthalmic emulsion Place 1 drop into both eyes 2 (two) times daily.    . ferrous sulfate 325 (65 FE) MG tablet Take 325 mg by mouth daily with breakfast.    . furosemide (LASIX) 20 MG tablet Take 1 tablet (20 mg total) by mouth as needed (Swelling). 30 tablet 6  . levothyroxine (SYNTHROID, LEVOTHROID) 75 MCG tablet Take 75 mcg by mouth daily.    [name]. loperamide (IMODIUM A-D) 2 MG tablet Take 0.5 tablets by mouth as needed.    . nitroGLYCERIN (NITROSTAT) 0.4 MG SL tablet Place 0.4 mg under the tongue every 5 (five) minutes as needed for chest pain (up to 3 doses).    [name]. oxyCODONE-acetaminophen (PERCOCET/ROXICET) 5-325 MG per tablet Take 1 tablet by mouth every 4 (four) hours as needed for pain.    . pantoprazole (PROTONIX) 40 MG tablet Take  40 mg by mouth daily.    . pregabalin (LYRICA) 50 MG capsule Take 50 mg by mouth 2 (two) times daily.    . psyllium (REGULOID) 0.52 G capsule Take 1.04 g by mouth 2 (two) times daily.    [name] rOPINIRole (REQUIP) 2 MG tablet Take 2 mg by mouth at bedtime.    . vitamin B-12 (CYANOCOBALAMIN) 1000 MCG tablet Take 1,000 mcg by mouth daily.     No current facility-administered medications for this visit.     Past Surgical History  Procedure Laterality Date  . Abdominal hysterectomy    . Total knee arthroplasty Right 2000  . Cholecystectomy    . Breast cyst excision Right 1970's  . Coronary angioplasty with stent placement  06/2012    DES proximal LAD [name] 05/11/2013  . Cardiac catheterization  05/11/2013  . Carpal tunnel release Right   . Cataract extraction w/ intraocular lens  implant, bilateral    . Bunionectomy Bilateral   . Hammer toe surgery Bilateral   . Toe surgery      "don't really remember which toe or which side; it was real  crooked and had to be straightened" (05/11/2013)  . Left heart catheterization with coronary angiogram N/A 07/23/2012    Procedure: LEFT HEART CATHETERIZATION WITH CORONARY ANGIOGRAM;  Surgeon: Kathleene Hazel, MD;  Location: [facility] CATH LAB;  Service: Cardiovascular;  Laterality: N/A;  . Percutaneous coronary stent intervention (pci-s)  07/23/2012    Procedure: PERCUTANEOUS CORONARY STENT INTERVENTION (PCI-S);  Surgeon: Kathleene Hazel, MD;  Location: [facility] CATH LAB;  Service: Cardiovascular;;  . Left heart catheterization with coronary angiogram N/A 05/11/2013    Procedure: LEFT HEART CATHETERIZATION WITH CORONARY ANGIOGRAM;  Surgeon: Kathleene Hazel, MD;  Location: [facility] CATH LAB;  Service: Cardiovascular;  Laterality: N/A;     Allergies  Allergen Reactions  . Morphine And Related Nausea Only  . Sulfa Antibiotics     unknown  . Sulfa Drugs Cross Reactors     unknown      No family history on file.   Social History Ms. Dudzinski reports that she has never smoked. She has never used smokeless tobacco. Ms. Severin reports that she does not drink alcohol.   Review of Systems CONSTITUTIONAL: No weight loss, fever, chills, weakness or fatigue.  HEENT: Eyes: No visual loss, blurred vision, double vision or yellow sclerae.No hearing loss, sneezing, congestion, runny nose or sore throat.  SKIN: No rash or itching.  CARDIOVASCULAR: per HPI RESPIRATORY: No shortness of breath, cough or sputum.  GASTROINTESTINAL: No anorexia, nausea, vomiting or diarrhea. No abdominal pain or blood.  GENITOURINARY: No burning on urination, no polyuria NEUROLOGICAL: No headache, dizziness, syncope, paralysis, ataxia, numbness or tingling in the extremities. No change in bowel or bladder control.  MUSCULOSKELETAL: No muscle, back pain, joint pain or stiffness.  LYMPHATICS: No enlarged nodes. No history of splenectomy.  PSYCHIATRIC: No history of depression or anxiety.  ENDOCRINOLOGIC: No reports of  sweating, cold or heat intolerance. No polyuria or polydipsia.  [name]   Physical Examination p 77 bp 160/90 Wt 155 lbs BMI 30 Gen: resting comfortably, no acute distress HEENT: no scleral icterus, pupils equal round and reactive, no palptable cervical adenopathy,  CV: RRR< no m/r/g, no JVD Resp: Clear to auscultation bilaterally GI: abdomen is soft, non-tender, non-distended, normal bowel sounds, no hepatosplenomegaly MSK: extremities are warm, trace bilateral edema Skin: warm, no rash Neuro:  no focal deficits Psych: appropriate affect   Diagnostic Studies  04/2013 Cath  Hemodynamic Findings:  Central aortic pressure: 161/60  Left ventricular pressure: 159/8/13  Angiographic Findings:  Left main: No obstructive disease.  Left Anterior Descending Artery: Large caliber vessel that courses to the apex. The proximal vessel has 20% stenosis. The mid vessel has a patent stented segment with no restenosis noted. The diagonal is patent. The mid to distal vessel has a focal 50% smooth stenosis which does not appear to be flow limiting, unchanged in appearance from cath in 2013.  Circumflex Artery: Moderate sized vessel with proximal plaque, 40% smooth stenosis in the mid vessel. The first OM is small and patent. The AV groove Circumflex terminates into a moderate caliber second OM Hazen Brumett.  Right Coronary Artery: Large, dominant vessel with 30% mid stenosis.  Left Ventricular Angiogram: Deferred.  Impression:  1. Stable single vessel CAD with patent stent proximal to mid LAD  2. Mild to moderate non-obstructive disease in the distal LAD, distal Circumflex and mid RCA  Recommendations: Continue medical management. Her d-dimer is slightly elevated but CTA chest at [facility] without evidence of PE. Will monitor overnight.   04/2013 Echo  LVEF 40-45%, grade I diastolic dysfunction,    Assessment and Plan   1. CAD/ICM/Chronic systolic heart failure - LVEF 16-10% by echo 04/2013,  she is NYHA I-II. Last cath 04/2013 showed patent LAD stent, non-obstructive disease  - appears euvolemic today, continue current meds  2. HTN  - elevated today, will increase norvasc to  daily.   3. Hyperlipidemia  - per pcp   F/u 6 months     Antoine Poche, M.D.

## 2014-12-08 NOTE — Patient Instructions (Signed)
   Increase Amlodipine to 10mg  daily - may take 2 of your 5mg  tabs till finish current supply (new sent to pharm) Continue all other medications.   Your physician wants you to follow up in: 6 months.  You will receive a reminder letter in the mail one-two months in advance.  If you don't receive a letter, please call our office to schedule the follow up appointment

## 2014-12-09 ENCOUNTER — Encounter: Payer: Self-pay | Admitting: *Deleted

## 2015-01-16 ENCOUNTER — Encounter: Payer: Self-pay | Admitting: Cardiovascular Disease

## 2015-01-16 ENCOUNTER — Encounter: Payer: Self-pay | Admitting: Cardiology

## 2015-01-17 ENCOUNTER — Encounter: Payer: Self-pay | Admitting: Cardiology

## 2015-03-28 ENCOUNTER — Encounter: Payer: Self-pay | Admitting: *Deleted

## 2015-03-28 ENCOUNTER — Ambulatory Visit (INDEPENDENT_AMBULATORY_CARE_PROVIDER_SITE_OTHER): Payer: Medicare Other | Admitting: Cardiology

## 2015-03-28 ENCOUNTER — Encounter: Payer: Self-pay | Admitting: Cardiology

## 2015-03-28 VITALS — BP 121/72 | HR 70 | Ht 60.0 in | Wt 160.0 lb

## 2015-03-28 DIAGNOSIS — I2510 Atherosclerotic heart disease of native coronary artery without angina pectoris: Secondary | ICD-10-CM

## 2015-03-28 DIAGNOSIS — I10 Essential (primary) hypertension: Secondary | ICD-10-CM | POA: Diagnosis not present

## 2015-03-28 DIAGNOSIS — E785 Hyperlipidemia, unspecified: Secondary | ICD-10-CM | POA: Diagnosis not present

## 2015-03-28 MED ORDER — TORSEMIDE 20 MG PO TABS: 20.0000 mg | ORAL_TABLET | Freq: Every day | ORAL | Status: DC

## 2015-03-28 NOTE — Progress Notes (Signed)
Clinical Summary Ana Pena is a 81 y.o.female seen today for follow up of the following medical problems.   1. CAD/ICM  - history of prior DES to LAD in 06/2012  - cath 04/2013 with patent LAD stent  - LVEF 40-45% by echo 04/2013   - no chest pain recently.  - she notes worsening LE edema, SOB, and weight gain since our last visit. PCP notes mention her cardiologist stopped her lasix but this is inaccurate. She had been on lasix  at our last visit and doing well.  - limiting salt intake. Avoiding NSAIDs.    2. HTN  - checks bp at home, systolics typically in 130s/70s. - compliant with meds  3. Hyperlipidemia  - compliant with statin  4. TIA - admitted to [facility] right arm weakness, off balance, facial asymmetry - started back on plavix    Past Medical History  Diagnosis Date  . Hypertension   . Hypothyroidism   . GERD (gastroesophageal reflux disease)   . H/O hiatal hernia   . Diverticulosis   . CAD (coronary artery disease)     a. s/p PTCA/DES to LAD 07/23/12. b. Mildly elevated trop 04/2013 with stable cath, no culprit.  . Diastolic CHF     a. Prior normal EF 06/2013. b. EF 40-45% by echo 04/2013.  [name] Abnormal CT scan     a. Question of possible renal cyst in RUQ by CT scan 06/2012 at [facility]. b. Redemonstrated 04/2013, pt instructed to f/u PCP.  [name] Hypercholesteremia   . Asthma   . Iron deficiency anemia   . Arthritis     "all over" (05/11/2013)  . Chronic lower back pain   . LBBB (left bundle Leilanni Halvorson block)   . CKD (chronic kidney disease)   . Chronic venous insufficiency      Allergies  Allergen Reactions  . Morphine And Related Nausea Only  . Sulfa Antibiotics     unknown  . Sulfa Drugs Cross Reactors     unknown     Current Outpatient Prescriptions  Medication Sig Dispense Refill  . alendronate (FOSAMAX) 70 MG tablet Take 70 mg by mouth every 7 (seven) days. Take with a full glass of water on an empty stomach.    [name] amLODipine (NORVASC) 10  MG tablet Take 1 tablet (10 mg total) by mouth daily. 30 tablet 6  . aspirin 81 MG tablet Take 81 mg by mouth daily.    [name] atorvastatin (LIPITOR) 20 MG tablet Take 20 mg by mouth every evening.    . benazepril (LOTENSIN) 20 MG tablet Take 20 mg by mouth daily.     . carvedilol (COREG) 12.5 MG tablet Take 12.5 mg by mouth 2 (two) times daily with a meal.    . Cholecalciferol (VITAMIN D-3) 1000 UNITS CAPS Take 5,000 Units by mouth daily.     . cycloSPORINE (RESTASIS) 0.05 % ophthalmic emulsion Place 1 drop into both eyes 2 (two) times daily as needed.     . ferrous sulfate 325 (65 FE) MG tablet Take 325 mg by mouth daily with breakfast.    . levothyroxine (SYNTHROID, LEVOTHROID) 75 MCG tablet Take 75 mcg by mouth daily.    . mupirocin ointment (BACTROBAN) 2 % Apply 1 application topically as directed.    . nitroGLYCERIN (NITROSTAT) 0.4 MG SL tablet Place 0.4 mg under the tongue every 5 (five) minutes as needed for chest pain (up to 3 doses).    [name] oxyCODONE-acetaminophen (PERCOCET/ROXICET) 5-325 MG  per tablet Take 1 tablet by mouth every 4 (four) hours as needed for pain.    . pantoprazole (PROTONIX) 40 MG tablet Take 40 mg by mouth daily.    . pregabalin (LYRICA) 50 MG capsule Take 50 mg by mouth 2 (two) times daily.    . psyllium (REGULOID) 0.52 G capsule Take 0.52 g by mouth 2 (two) times daily.     [name]. rOPINIRole (REQUIP) 2 MG tablet Take 2 mg by mouth at bedtime.     No current facility-administered medications for this visit.     Past Surgical History  Procedure Laterality Date  . Abdominal hysterectomy    . Total knee arthroplasty Right 2000  . Cholecystectomy    . Breast cyst excision Right 1970's  . Coronary angioplasty with stent placement  06/2012    DES proximal LAD [name]/notes 05/11/2013  . Cardiac catheterization  05/11/2013  . Carpal tunnel release Right   . Cataract extraction w/ intraocular lens  implant, bilateral    . Bunionectomy Bilateral   . Hammer toe surgery Bilateral   .  Toe surgery      "don't really remember which toe or which side; it was real crooked and had to be straightened" (05/11/2013)  . Left heart catheterization with coronary angiogram N/A 07/23/2012    Procedure: LEFT HEART CATHETERIZATION WITH CORONARY ANGIOGRAM;  Surgeon: Kathleene Hazelhristopher D McAlhany, MD;  [facility];  Service: Cardiovascular;  Laterality: N/A;  . Percutaneous coronary stent intervention (pci-s)  07/23/2012    Procedure: PERCUTANEOUS CORONARY STENT INTERVENTION (PCI-S);  Surgeon: Kathleene Hazelhristopher D McAlhany, MD;  [facility];  Service: Cardiovascular;;  . Left heart catheterization with coronary angiogram N/A 05/11/2013    Procedure: LEFT HEART CATHETERIZATION WITH CORONARY ANGIOGRAM;  Surgeon: Kathleene Hazelhristopher D McAlhany, MD;  [facility];  Service: Cardiovascular;  Laterality: N/A;     Allergies  Allergen Reactions  . Morphine And Related Nausea Only  . Sulfa Antibiotics     unknown  . Sulfa Drugs Cross Reactors     unknown      No family history on file.   Social History Ana Pena reports that she has never smoked. She has never used smokeless tobacco. Ana Pena reports that she does not drink alcohol.   Review of Systems CONSTITUTIONAL: No weight loss, fever, chills, weakness or fatigue.  HEENT: Eyes: No visual loss, blurred vision, double vision or yellow sclerae.No hearing loss, sneezing, congestion, runny nose or sore throat.  SKIN: No rash or itching.  CARDIOVASCULAR: per HPI RESPIRATORY: No shortness of breath, cough or sputum.  GASTROINTESTINAL: No anorexia, nausea, vomiting or diarrhea. No abdominal pain or blood.  GENITOURINARY: No burning on urination, no polyuria NEUROLOGICAL: No headache, dizziness, syncope, paralysis, ataxia, numbness or tingling in the extremities. No change in bowel or bladder control.  MUSCULOSKELETAL: No muscle, back pain, joint pain or stiffness.  LYMPHATICS: No enlarged nodes. No history of splenectomy.    PSYCHIATRIC: No history of depression or anxiety.  ENDOCRINOLOGIC: No reports of sweating, cold or heat intolerance. No polyuria or polydipsia.  [name].   Physical Examination p 70 bp 121/72 Wt 160 lbs BMI 31 Gen: resting comfortably, no acute distress HEENT: no scleral icterus, pupils equal round and reactive, no palptable cervical adenopathy,  CV: RRR,  No m/r/g, no 1+bilateral edema Resp: mild bilateral crackles GI: abdomen is soft, non-tender, non-distended, normal bowel sounds, no hepatosplenomegaly MSK: extremities are warm, 1+ bilateral edema Skin: warm, no rash Neuro:  no  focal deficits Psych: appropriate affect   Diagnostic Studies 04/2013 Cath  Hemodynamic Findings:  Central aortic pressure: 161/60  Left ventricular pressure: 159/8/13  Angiographic Findings:  Left main: No obstructive disease.  Left Anterior Descending Artery: Large caliber vessel that courses to the apex. The proximal vessel has 20% stenosis. The mid vessel has a patent stented segment with no restenosis noted. The diagonal is patent. The mid to distal vessel has a focal 50% smooth stenosis which does not appear to be flow limiting, unchanged in appearance from cath in 2013.  Circumflex Artery: Moderate sized vessel with proximal plaque, 40% smooth stenosis in the mid vessel. The first OM is small and patent. The AV groove Circumflex terminates into a moderate caliber second OM Yovany Clock.  Right Coronary Artery: Large, dominant vessel with 30% mid stenosis.  Left Ventricular Angiogram: Deferred.  Impression:  1. Stable single vessel CAD with patent stent proximal to mid LAD  2. Mild to moderate non-obstructive disease in the distal LAD, distal Circumflex and mid RCA  Recommendations: Continue medical management. Her d-dimer is slightly elevated but CTA chest at [facility] without evidence of PE. Will monitor overnight.   04/2013 Echo  LVEF 40-45%, grade I diastolic dysfunction,        Assessment and Plan  1. CAD/ICM/Chronic systolic heart failure - LVEF 16-10% by echo 04/2013, she is NYHA I-II. Last cath 04/2013 showed patent LAD stent, non-obstructive disease  - she appears volume overloaded today, weight is up at least 5 lbs since our last visit - PCP notes mention her cardiologist stopped her lasix but this is inaccurate. She had been on lasix  at our last visit and doing well.  - change her diuretic to toresmide , as she reports minimal response to lasix at 20 and 40 mg daily. - also unclear what happened to coreg which was on her mediation list previously, unclear if stopped during recent admission for any particular reason. Request records, if no contraindication restart once more euvolemic - pending response to diuretics will consider repeating echo at next visit.  2. HTN  - at goal, continue current meds  3. Hyperlipidemia  - request most recent labs from pcp   F/u 3 weeks   Antoine Poche, M.D.

## 2015-03-28 NOTE — Patient Instructions (Signed)
Your physician recommends that you schedule a follow-up appointment in: 3 WEEKS WITH DR. BRANCH  Your physician has recommended you make the following change in your medication:   STOP LASIX  START TORSEMIDE 20 MG DAILY AS NEEDED FOR SWELLING  CONTINUE ALL OTHER MEDICATIONS AS DIRECTED  [facility]YAS  Your physician has requested that you regularly monitor and record your blood pressure readings at home FOR 3 WEEKS AND BRING TO YOUR NEXT OFFICE VISIT. Please use the same machine at the same time of day to check your readings and record them to bring to your follow-up visit.  Thank you for choosing [facility]!!

## 2015-04-19 ENCOUNTER — Encounter: Payer: Self-pay | Admitting: *Deleted

## 2015-04-20 ENCOUNTER — Encounter: Payer: Self-pay | Admitting: Cardiology

## 2015-04-20 ENCOUNTER — Ambulatory Visit (INDEPENDENT_AMBULATORY_CARE_PROVIDER_SITE_OTHER): Payer: Medicare Other | Admitting: Cardiology

## 2015-04-20 VITALS — BP 132/75 | HR 64 | Ht 60.0 in | Wt 160.0 lb

## 2015-04-20 DIAGNOSIS — I5023 Acute on chronic systolic (congestive) heart failure: Secondary | ICD-10-CM | POA: Diagnosis not present

## 2015-04-20 DIAGNOSIS — I2510 Atherosclerotic heart disease of native coronary artery without angina pectoris: Secondary | ICD-10-CM

## 2015-04-20 NOTE — Progress Notes (Signed)
Clinical Summary Ana Pena is a 81 y.o.female seen today for follow up of the following medical problems. Thi is a focused visit on her history of chronic systolic heart failure  1. CAD/ICM/Chronic systolic heart failure - history of prior DES to LAD in 06/2012  - cath 04/2013 with patent LAD stent  - LVEF 40-45% by echo 04/2013   - last visit she noted increaseing LE edema, weight gain, and SOB. We changed to her to toresmide  prn, however she has only taken 2-3 doses per week since our last visit. Weights remain stable and elevated, continues to have LE edema    Past Medical History  Diagnosis Date  . Hypertension   . Hypothyroidism   . GERD (gastroesophageal reflux disease)   . H/O hiatal hernia   . Diverticulosis   . CAD (coronary artery disease)     a. s/p PTCA/DES to LAD 07/23/12. b. Mildly elevated trop 04/2013 with stable cath, no culprit.  . Diastolic CHF     a. Prior normal EF 06/2013. b. EF 40-45% by echo 04/2013.  [name] Abnormal CT scan     a. Question of possible renal cyst in RUQ by CT scan 06/2012 at [facility]. b. Redemonstrated 04/2013, pt instructed to f/u PCP.  [name] Hypercholesteremia   . Asthma   . Iron deficiency anemia   . Arthritis     "all over" (05/11/2013)  . Chronic lower back pain   . LBBB (left bundle Ana Pena block)   . CKD (chronic kidney disease)   . Chronic venous insufficiency      Allergies  Allergen Reactions  . Morphine And Related Nausea Only  . Sulfa Antibiotics     unknown  . Sulfa Drugs Cross Reactors     unknown     Current Outpatient Prescriptions  Medication Sig Dispense Refill  . alendronate (FOSAMAX) 70 MG tablet Take 70 mg by mouth every 7 (seven) days. Take with a full glass of water on an empty stomach.    [name] amLODipine (NORVASC) 10 MG tablet Take 1 tablet (10 mg total) by mouth daily. 30 tablet 6  . aspirin 81 MG tablet Take 81 mg by mouth daily.    [name] atorvastatin (LIPITOR) 20 MG tablet Take 20 mg by mouth every evening.     . benazepril (LOTENSIN) 20 MG tablet Take 20 mg by mouth daily.     . Cholecalciferol (VITAMIN D-3) 1000 UNITS CAPS Take 5,000 Units by mouth daily.     . clopidogrel (PLAVIX) 75 MG tablet Take 1 tablet by mouth daily.    . diazepam (VALIUM) 2 MG tablet Take 1 tablet by mouth 3 (three) times daily as needed.    . ferrous sulfate 325 (65 FE) MG tablet Take 325 mg by mouth daily with breakfast.    . levothyroxine (SYNTHROID, LEVOTHROID) 75 MCG tablet Take 75 mcg by mouth daily.    . nitroGLYCERIN (NITROSTAT) 0.4 MG SL tablet Place 0.4 mg under the tongue every 5 (five) minutes as needed for chest pain (up to 3 doses).    [name] oxyCODONE-acetaminophen (PERCOCET/ROXICET) 5-325 MG per tablet Take 1 tablet by mouth 2 (two) times daily as needed.     . pregabalin (LYRICA) 50 MG capsule Take 50 mg by mouth 2 (two) times daily.    . psyllium (REGULOID) 0.52 G capsule Take 0.52 g by mouth 2 (two) times daily.     . ranitidine (ZANTAC) 300 MG tablet Take 1 tablet by mouth  daily.    . rOPINIRole (REQUIP) 2 MG tablet Take 2 mg by mouth at bedtime.    . torsemide (DEMADEX) 20 MG tablet Take 1 tablet (20 mg total) by mouth daily. As needed for swelling 90 tablet 3   No current facility-administered medications for this visit.     Past Surgical History  Procedure Laterality Date  . Abdominal hysterectomy    . Total knee arthroplasty Right 2000  . Cholecystectomy    . Breast cyst excision Right 1970's  . Coronary angioplasty with stent placement  06/2012    DES proximal LAD Ana Pena 05/11/2013  . Cardiac catheterization  05/11/2013  . Carpal tunnel release Right   . Cataract extraction w/ intraocular lens  implant, bilateral    . Bunionectomy Bilateral   . Hammer toe surgery Bilateral   . Toe surgery      "don't really remember which toe or which side; it was real crooked and had to be straightened" (05/11/2013)  . Left heart catheterization with coronary angiogram N/A 07/23/2012    Procedure: LEFT HEART  CATHETERIZATION WITH CORONARY ANGIOGRAM;  Surgeon: Kathleene Hazel, MD;  Location: [facility] CATH LAB;  Service: Cardiovascular;  Laterality: N/A;  . Percutaneous coronary stent intervention (pci-s)  07/23/2012    Procedure: PERCUTANEOUS CORONARY STENT INTERVENTION (PCI-S);  Surgeon: Kathleene Hazel, MD;  Location: [facility] CATH LAB;  Service: Cardiovascular;;  . Left heart catheterization with coronary angiogram N/A 05/11/2013    Procedure: LEFT HEART CATHETERIZATION WITH CORONARY ANGIOGRAM;  Surgeon: Kathleene Hazel, MD;  Location: [facility] CATH LAB;  Service: Cardiovascular;  Laterality: N/A;     Allergies  Allergen Reactions  . Morphine And Related Nausea Only  . Sulfa Antibiotics     unknown  . Sulfa Drugs Cross Reactors     unknown      Family History  Problem Relation Age of Onset  . Breast cancer Mother   . Heart disease Father      Social History Ms. Knight reports that she has never smoked. She has never used smokeless tobacco. Ms. Reddick reports that she does not drink alcohol.   Review of Systems CONSTITUTIONAL: No weight loss, fever, chills, weakness or fatigue.  HEENT: Eyes: No visual loss, blurred vision, double vision or yellow sclerae.No hearing loss, sneezing, congestion, runny nose or sore throat.  SKIN: No rash or itching.  CARDIOVASCULAR: no chest pain, no palpitations RESPIRATORY: No shortness of breath, cough or sputum.  GASTROINTESTINAL: No anorexia, nausea, vomiting or diarrhea. No abdominal pain or blood.  GENITOURINARY: No burning on urination, no polyuria NEUROLOGICAL: No headache, dizziness, syncope, paralysis, ataxia, numbness or tingling in the extremities. No change in bowel or bladder control.  MUSCULOSKELETAL: No muscle, back pain, joint pain or stiffness.  LYMPHATICS: No enlarged nodes. No history of splenectomy.  PSYCHIATRIC: No history of depression or anxiety.  ENDOCRINOLOGIC: No reports of sweating, cold or heat intolerance. No polyuria  or polydipsia.  [name]   Physical Examination Filed Vitals:   04/20/15 0923  BP: 132/75  Pulse: 64   Filed Vitals:   04/20/15 0923  Height: 5' (1.524 m)  Weight: 160 lb (72.576 kg)    Gen: resting comfortably, no acute distress HEENT: no scleral icterus, pupils equal round and reactive, no palptable cervical adenopathy,  CV: RRR, no m/r/g, no JVD Resp: Clear to auscultation bilaterally GI: abdomen is soft, non-tender, non-distended, normal bowel sounds, no hepatosplenomegaly MSK: extremities are warm, 1+ bilateral LE edema Skin: warm, no rash Neuro:  no focal deficits Psych: appropriate affect   Diagnostic Studies 04/2013 Cath  Hemodynamic Findings:  Central aortic pressure: 161/60  Left ventricular pressure: 159/8/13  Angiographic Findings:  Left main: No obstructive disease.  Left Anterior Descending Artery: Large caliber vessel that courses to the apex. The proximal vessel has 20% stenosis. The mid vessel has a patent stented segment with no restenosis noted. The diagonal is patent. The mid to distal vessel has a focal 50% smooth stenosis which does not appear to be flow limiting, unchanged in appearance from cath in 2013.  Circumflex Artery: Moderate sized vessel with proximal plaque, 40% smooth stenosis in the mid vessel. The first OM is small and patent. The AV groove Circumflex terminates into a moderate caliber second OM Ana Pena.  Right Coronary Artery: Large, dominant vessel with 30% mid stenosis.  Left Ventricular Angiogram: Deferred.  Impression:  1. Stable single vessel CAD with patent stent proximal to mid LAD  2. Mild to moderate non-obstructive disease in the distal LAD, distal Circumflex and mid RCA  Recommendations: Continue medical management. Her d-dimer is slightly elevated but [facility]. Will monitor overnight.   04/2013 Echo  LVEF 40-45%, grade I diastolic dysfunction,     Assessment and Plan   1.  CAD/ICM/Acute on chronic systolic heart failure - LVEF 16-10%40-45% by echo 04/2013, she is NYHA I-II. Last cath 04/2013 showed patent LAD stent, non-obstructive disease  -she remains volume overloaded after changing to torsemide, but has only taken 2-3 does a week since our last visit. Encouraged to take daily if notices continued swelling and elevated weights, once improved can change to prn only. I have asked her to take daily x 7 days then change to prn, we will check BMET and Mg in 1 weeks  Ana FerryJ Deunte Bledsoe MD    F/u 2 months   Ana PocheJonathan F. Keyden Pena, M.D.

## 2015-04-20 NOTE — Patient Instructions (Signed)
Your physician recommends that you schedule a follow-up appointment in: 2 months with Dr. Wyline MoodBranch  Your physician has recommended you make the following change in your medication:   TAKE TORSEMIDE 20 MG EVERYDAY FOR 7 DAYS THEN TAKE AS NEEDED  CONTINUE ALL OTHER MEDICATIONS AS DIRECTED   Your physician recommends that you return for lab work BMP/MAG  [facility] choosing [facility]!!

## 2015-05-10 ENCOUNTER — Telehealth: Payer: Self-pay | Admitting: Cardiology

## 2015-05-10 ENCOUNTER — Encounter: Payer: Self-pay | Admitting: *Deleted

## 2015-05-10 NOTE — Telephone Encounter (Signed)
Pt had labs done at [facility] will request results and route to Dr. Wyline Mood. Pt aware that we have not received them yet.

## 2015-05-10 NOTE — Telephone Encounter (Signed)
Wanting test results

## 2015-05-11 ENCOUNTER — Telehealth: Payer: Self-pay | Admitting: *Deleted

## 2015-05-11 NOTE — Telephone Encounter (Signed)
-----   Message from Antoine Poche, MD sent at 05/11/2015  9:08 AM EDT ----- Labs show mild change in renal function likely due to toresmide. Please see how often she is taking it currently and let me know  Dominga Ferry MD

## 2015-05-11 NOTE — Telephone Encounter (Signed)
Will continue to monitor kidney function, likely due to taking a few days in a row after I last saw her. Please repeat BMET and Mg in 3 weeks

## 2015-05-11 NOTE — Telephone Encounter (Signed)
Pt confirmed only taking torsemide as needed for swelling. Does not take daily, only when ankles swell. Forward to Dr. Wyline Mood

## 2015-05-11 NOTE — Telephone Encounter (Signed)
Pt and daughter made aware, will repeat labs in 3 weeks.

## 2015-05-30 ENCOUNTER — Telehealth: Payer: Self-pay | Admitting: *Deleted

## 2015-05-30 ENCOUNTER — Encounter: Payer: Self-pay | Admitting: *Deleted

## 2015-05-30 DIAGNOSIS — I5030 Unspecified diastolic (congestive) heart failure: Secondary | ICD-10-CM

## 2015-05-30 NOTE — Telephone Encounter (Signed)
Per previous telephone note, repeat labs ordered and letter mailed to pt as reminder.

## 2015-06-21 ENCOUNTER — Telehealth: Payer: Self-pay | Admitting: *Deleted

## 2015-06-21 NOTE — Telephone Encounter (Signed)
Pt aware, labs scanned from [facility]

## 2015-06-21 NOTE — Telephone Encounter (Signed)
-----   Message from Antoine Poche, MD sent at 06/21/2015  1:30 PM EDT ----- Labs show kidney function has improved  Dominga Ferry MD

## 2015-06-23 ENCOUNTER — Ambulatory Visit (INDEPENDENT_AMBULATORY_CARE_PROVIDER_SITE_OTHER): Payer: Medicare Other | Admitting: Cardiology

## 2015-06-23 ENCOUNTER — Telehealth: Payer: Self-pay | Admitting: Cardiology

## 2015-06-23 ENCOUNTER — Encounter: Payer: Self-pay | Admitting: Cardiology

## 2015-06-23 VITALS — BP 111/65 | HR 66 | Ht 60.0 in | Wt 157.8 lb

## 2015-06-23 DIAGNOSIS — I2510 Atherosclerotic heart disease of native coronary artery without angina pectoris: Secondary | ICD-10-CM | POA: Diagnosis not present

## 2015-06-23 DIAGNOSIS — I5022 Chronic systolic (congestive) heart failure: Secondary | ICD-10-CM | POA: Diagnosis not present

## 2015-06-23 DIAGNOSIS — R0602 Shortness of breath: Secondary | ICD-10-CM

## 2015-06-23 MED ORDER — CARVEDILOL 3.125 MG PO TABS: 3.1250 mg | ORAL_TABLET | Freq: Two times a day (BID) | ORAL | Status: DC

## 2015-06-23 NOTE — Telephone Encounter (Signed)
No precert required. Medicare and [employer] MCR sup.

## 2015-06-23 NOTE — Progress Notes (Signed)
Patient ID: Ana Pena, female   DOB: 02/25/1926, 81 y.o.   MRN: [id number]     Clinical Summary Ana Pena is a 81 y.o.female seen today for follow up of the following medical problems. This is a focused visit on her history of CAD and ICM.   1. CAD/ICM/Chronic systolic heart failure - history of prior DES to LAD in 06/2012  - cath 04/2013 with patent LAD stent  - LVEF 40-45% by echo 04/2013   - feels short winded,DOE with short distances. Notes some chest pain at times. Episode yesterday while at [facility]. Sharp pain in midchest, 10/10 in severity. No other symptoms. No positional. Lasted 15 minutes. Occurs once every 3-4 weeks. No relation to food.    Past Medical History  Diagnosis Date  . Hypertension   . Hypothyroidism   . GERD (gastroesophageal reflux disease)   . H/O hiatal hernia   . Diverticulosis   . CAD (coronary artery disease)     a. s/p PTCA/DES to LAD 07/23/12. b. Mildly elevated trop 04/2013 with stable cath, no culprit.  . Diastolic CHF     a. Prior normal EF 06/2013. b. EF 40-45% by echo 04/2013.  [name] Abnormal CT scan     a. Question of possible renal cyst in RUQ by CT scan 06/2012 at [facility]. b. Redemonstrated 04/2013, pt instructed to f/u PCP.  [name] Hypercholesteremia   . Asthma   . Iron deficiency anemia   . Arthritis     "all over" (05/11/2013)  . Chronic lower back pain   . LBBB (left bundle Olie Scaffidi block)   . CKD (chronic kidney disease)   . Chronic venous insufficiency      Allergies  Allergen Reactions  . Morphine And Related Nausea Only  . Sulfa Antibiotics     unknown  . Sulfa Drugs Cross Reactors     unknown     Current Outpatient Prescriptions  Medication Sig Dispense Refill  . alendronate (FOSAMAX) 70 MG tablet Take 70 mg by mouth every 7 (seven) days. Take with a full glass of water on an empty stomach.    [name] amLODipine (NORVASC) 10 MG tablet Take 1 tablet (10 mg total) by mouth daily. 30 tablet 6  . aspirin 81 MG tablet Take 81 mg by  mouth daily.    [name] atorvastatin (LIPITOR) 20 MG tablet Take 20 mg by mouth every evening.    . benazepril (LOTENSIN) 20 MG tablet Take 20 mg by mouth daily.     . Cholecalciferol (VITAMIN D-3) 1000 UNITS CAPS Take 5,000 Units by mouth daily.     . clopidogrel (PLAVIX) 75 MG tablet Take 1 tablet by mouth daily.    . diazepam (VALIUM) 2 MG tablet Take 1 tablet by mouth 3 (three) times daily as needed.    . ferrous sulfate 325 (65 FE) MG tablet Take 325 mg by mouth daily with breakfast.    . levothyroxine (SYNTHROID, LEVOTHROID) 75 MCG tablet Take 75 mcg by mouth daily.    . nitroGLYCERIN (NITROSTAT) 0.4 MG SL tablet Place 0.4 mg under the tongue every 5 (five) minutes as needed for chest pain (up to 3 doses).    [name] oxyCODONE-acetaminophen (PERCOCET/ROXICET) 5-325 MG per tablet Take 1 tablet by mouth 2 (two) times daily as needed.     . pregabalin (LYRICA) 50 MG capsule Take 50 mg by mouth 2 (two) times daily.    . psyllium (REGULOID) 0.52 G capsule Take 0.52 g by mouth 2 (two)  times daily.     . ranitidine (ZANTAC) 300 MG tablet Take 1 tablet by mouth daily.    [name] rOPINIRole (REQUIP) 2 MG tablet Take 2 mg by mouth at bedtime.    . torsemide (DEMADEX) 20 MG tablet Take 1 tablet (20 mg total) by mouth daily. As needed for swelling 90 tablet 3   No current facility-administered medications for this visit.     Past Surgical History  Procedure Laterality Date  . Abdominal hysterectomy    . Total knee arthroplasty Right 2000  . Cholecystectomy    . Breast cyst excision Right 1970's  . Coronary angioplasty with stent placement  06/2012    DES proximal LAD [name] 05/11/2013  . Cardiac catheterization  05/11/2013  . Carpal tunnel release Right   . Cataract extraction w/ intraocular lens  implant, bilateral    . Bunionectomy Bilateral   . Hammer toe surgery Bilateral   . Toe surgery      "don't really remember which toe or which side; it was real crooked and had to be straightened" (05/11/2013)  .  Left heart catheterization with coronary angiogram N/A 07/23/2012    Procedure: LEFT HEART CATHETERIZATION WITH CORONARY ANGIOGRAM;  Surgeon: Kathleene Hazel, MD;  Location: [facility] CATH LAB;  Service: Cardiovascular;  Laterality: N/A;  . Percutaneous coronary stent intervention (pci-s)  07/23/2012    Procedure: PERCUTANEOUS CORONARY STENT INTERVENTION (PCI-S);  Surgeon: Kathleene Hazel, MD;  Location: [facility] CATH LAB;  Service: Cardiovascular;;  . Left heart catheterization with coronary angiogram N/A 05/11/2013    Procedure: LEFT HEART CATHETERIZATION WITH CORONARY ANGIOGRAM;  Surgeon: Kathleene Hazel, MD;  Location: [facility] CATH LAB;  Service: Cardiovascular;  Laterality: N/A;     Allergies  Allergen Reactions  . Morphine And Related Nausea Only  . Sulfa Antibiotics     unknown  . Sulfa Drugs Cross Reactors     unknown      Family History  Problem Relation Age of Onset  . Breast cancer Mother   . Heart disease Father      Social History Ana Pena reports that she has never smoked. She has never used smokeless tobacco. Ana Pena reports that she does not drink alcohol.   Review of Systems CONSTITUTIONAL: No weight loss, fever, chills, weakness or fatigue.  HEENT: Eyes: No visual loss, blurred vision, double vision or yellow sclerae.No hearing loss, sneezing, congestion, runny nose or sore throat.  SKIN: No rash or itching.  CARDIOVASCULAR: per HPI RESPIRATORY: No shortness of breath, cough or sputum.  GASTROINTESTINAL: No anorexia, nausea, vomiting or diarrhea. No abdominal pain or blood.  GENITOURINARY: No burning on urination, no polyuria NEUROLOGICAL: No headache, dizziness, syncope, paralysis, ataxia, numbness or tingling in the extremities. No change in bowel or bladder control.  MUSCULOSKELETAL: No muscle, back pain, joint pain or stiffness.  LYMPHATICS: No enlarged nodes. No history of splenectomy.  PSYCHIATRIC: No history of depression or anxiety.    ENDOCRINOLOGIC: No reports of sweating, cold or heat intolerance. No polyuria or polydipsia.  [name]   Physical Examination There were no vitals filed for this visit. There were no vitals filed for this visit.  Gen: resting comfortably, no acute distress HEENT: no scleral icterus, pupils equal round and reactive, no palptable cervical adenopathy,  CV Resp: Clear to auscultation bilaterally GI: abdomen is soft, non-tender, non-distended, normal bowel sounds, no hepatosplenomegaly MSK: extremities are warm, no edema.  Skin: warm, no rash Neuro:  no focal deficits Psych: appropriate affect  Diagnostic Studies 04/2013 Cath  Hemodynamic Findings:  Central aortic pressure: 161/60  Left ventricular pressure: 159/8/13  Angiographic Findings:  Left main: No obstructive disease.  Left Anterior Descending Artery: Large caliber vessel that courses to the apex. The proximal vessel has 20% stenosis. The mid vessel has a patent stented segment with no restenosis noted. The diagonal is patent. The mid to distal vessel has a focal 50% smooth stenosis which does not appear to be flow limiting, unchanged in appearance from cath in 2013.  Circumflex Artery: Moderate sized vessel with proximal plaque, 40% smooth stenosis in the mid vessel. The first OM is small and patent. The AV groove Circumflex terminates into a moderate caliber second OM Myley Bahner.  Right Coronary Artery: Large, dominant vessel with 30% mid stenosis.  Left Ventricular Angiogram: Deferred.  Impression:  1. Stable single vessel CAD with patent stent proximal to mid LAD  2. Mild to moderate non-obstructive disease in the distal LAD, distal Circumflex and mid RCA  Recommendations: Continue medical management. Her d-dimer is slightly elevated but CTA chest at [facility] without evidence of PE. Will monitor overnight.   04/2013 Echo  LVEF 40-45%, grade I diastolic dysfunction,     Assessment and Plan  1. CAD/ICM/Acute on  chronic systolic heart failure - LVEF 16-10% by echo 04/2013, she is NYHA I-II. Last cath 04/2013 showed patent LAD stent, non-obstructive disease  - worsening LE edema, DOE, and episodes of chest pain over the last few months - will obtain echo given progression of symptoms, pending results consider Lexiscan MPI vs cath. - restart her coreg 3.125mg  bid, stop norvasc to allow room with bp.   F/u 1 month     Antoine Poche, M.D.

## 2015-06-23 NOTE — Telephone Encounter (Signed)
Echo scheduled at [facility] on Aug 4th arrival time 8:15

## 2015-06-23 NOTE — Patient Instructions (Signed)
Your physician recommends that you schedule a follow-up appointment in: 1 month with Dr. Wyline Mood  Your physician has recommended you make the following change in your medication:   STOP AMLODIPINE   START CARVEDILOL 3.125 TWICE DAILY  Your physician has requested that you have an echocardiogram. Echocardiography is a painless test that uses sound waves to create images of your heart. It provides your doctor with information about the size and shape of your heart and how well your heart's chambers and valves are working. This procedure takes approximately one hour. There are no restrictions for this procedure.  Thank you for choosing [facility]!!

## 2015-06-29 ENCOUNTER — Ambulatory Visit (HOSPITAL_COMMUNITY)
Admission: RE | Admit: 2015-06-29 | Discharge: 2015-06-29 | Disposition: A | Discharge status: Home / Self Care | Payer: Medicare Other | Source: Ambulatory Visit | Attending: Cardiology | Admitting: Cardiology

## 2015-06-29 DIAGNOSIS — R0602 Shortness of breath: Secondary | ICD-10-CM

## 2015-06-29 DIAGNOSIS — I083 Combined rheumatic disorders of mitral, aortic and tricuspid valves: Secondary | ICD-10-CM | POA: Insufficient documentation

## 2015-07-04 ENCOUNTER — Encounter: Payer: Self-pay | Admitting: *Deleted

## 2015-07-04 ENCOUNTER — Telehealth: Payer: Self-pay | Admitting: *Deleted

## 2015-07-04 DIAGNOSIS — R079 Chest pain, unspecified: Secondary | ICD-10-CM

## 2015-07-04 NOTE — Telephone Encounter (Signed)
-----   Message from Antoine Poche, MD sent at 07/03/2015  1:36 PM EDT ----- Echo shows no new changes, her heart function remains mildly decreased. For her chest pain please order a lexiscan, does not need to hold any meds  Dominga Ferry MD

## 2015-07-04 NOTE — Telephone Encounter (Signed)
Pt aware and agreeable to do lexi. Will forward to schedulers and route results to pcp

## 2015-07-13 ENCOUNTER — Encounter (HOSPITAL_COMMUNITY)
Admission: RE | Admit: 2015-07-13 | Discharge: 2015-07-13 | Disposition: A | Discharge status: Home / Self Care | Payer: Medicare Other | Source: Ambulatory Visit | Attending: Cardiology | Admitting: Cardiology

## 2015-07-13 ENCOUNTER — Inpatient Hospital Stay (HOSPITAL_COMMUNITY): Admission: RE | Admit: 2015-07-13 | Payer: Medicare Other | Source: Ambulatory Visit

## 2015-07-13 ENCOUNTER — Encounter (HOSPITAL_COMMUNITY): Payer: Self-pay

## 2015-07-13 DIAGNOSIS — R079 Chest pain, unspecified: Secondary | ICD-10-CM | POA: Insufficient documentation

## 2015-07-13 LAB — NM MYOCAR MULTI W/SPECT W/WALL MOTION / EF
CHL CUP NUCLEAR SSS: 4
CSEPPHR: 93 {beats}/min
LVDIAVOL: 69 mL
LVSYSVOL: 33 mL
Rest HR: 56 {beats}/min
SDS: 3
SRS: 1
TID: 1.04

## 2015-07-13 MED ORDER — REGADENOSON 0.4 MG/5ML IV SOLN
INTRAVENOUS | Status: AC
  Administered 2015-07-13: 0.4 mg via INTRAVENOUS
  Filled 2015-07-13: qty 5

## 2015-07-13 MED ORDER — TECHNETIUM TC 99M SESTAMIBI GENERIC - CARDIOLITE
10.0000 | Freq: Once | INTRAVENOUS | Status: AC | PRN
  Administered 2015-07-13: 10 via INTRAVENOUS

## 2015-07-13 MED ORDER — SODIUM CHLORIDE 0.9 % IJ SOLN
INTRAMUSCULAR | Status: AC
  Administered 2015-07-13: 10 mL via INTRAVENOUS
  Filled 2015-07-13: qty 3

## 2015-07-13 MED ORDER — TECHNETIUM TC 99M SESTAMIBI - CARDIOLITE
30.0000 | Freq: Once | INTRAVENOUS | Status: AC | PRN
  Administered 2015-07-13: 10:00:00 30 via INTRAVENOUS

## 2015-07-14 ENCOUNTER — Telehealth: Payer: Self-pay | Admitting: *Deleted

## 2015-07-14 NOTE — Telephone Encounter (Signed)
-----   Message from Antoine Poche, MD sent at 07/14/2015  9:41 AM EDT ----- Stress test overall looks good. No evidence of any blockages in the arteries of her heart. Will discuss further at our f/u  J BrancH MD

## 2015-07-14 NOTE — Telephone Encounter (Signed)
Pt aware, routed to pcp, confirmed appt 

## 2015-07-25 ENCOUNTER — Ambulatory Visit: Payer: Medicare Other | Admitting: Cardiology

## 2015-08-01 ENCOUNTER — Ambulatory Visit: Payer: Medicare Other | Admitting: Cardiology

## 2015-08-03 ENCOUNTER — Ambulatory Visit (INDEPENDENT_AMBULATORY_CARE_PROVIDER_SITE_OTHER): Payer: Medicare Other | Admitting: Cardiology

## 2015-08-03 ENCOUNTER — Encounter: Payer: Self-pay | Admitting: Cardiology

## 2015-08-03 VITALS — BP 156/75 | HR 65 | Ht 60.0 in | Wt 157.8 lb

## 2015-08-03 DIAGNOSIS — R079 Chest pain, unspecified: Secondary | ICD-10-CM | POA: Diagnosis not present

## 2015-08-03 DIAGNOSIS — I2510 Atherosclerotic heart disease of native coronary artery without angina pectoris: Secondary | ICD-10-CM

## 2015-08-03 DIAGNOSIS — R0602 Shortness of breath: Secondary | ICD-10-CM

## 2015-08-03 DIAGNOSIS — I5022 Chronic systolic (congestive) heart failure: Secondary | ICD-10-CM | POA: Diagnosis not present

## 2015-08-03 NOTE — Progress Notes (Signed)
Patient ID: SHALEEN TALAMANTEZ, female   DOB: 04/25/1926, 81 y.o.   MRN: [id number]     Clinical Summary Ms. Jentz is a 81 y.o.female seen today for follow up of the following medical problems. This is a focused visit on her history of CAD and ICM.   1. CAD/ICM/Chronic systolic heart failure - history of prior DES to LAD in 06/2012  - cath 04/2013 with patent LAD stent  - LVEF 40-45% by echo 04/2013   - last visit described some DOE with short distances. Notes some chest pain at times. Episode while at [facility]. Sharp pain in midchest, 10/10 in severity. No other symptoms. No positional. Lasted 15 minutes. Occurs once every 3-4 weeks. No relation to food.  - we repeated her echo which showed stable LVEF at 40-45%, gradeI diastolic dysfunction.  - Lexiscan MPI 06/2015 showed no ischemia. - since last visit symptoms have continued.     Past Medical History  Diagnosis Date  . Hypertension   . Hypothyroidism   . GERD (gastroesophageal reflux disease)   . H/O hiatal hernia   . Diverticulosis   . CAD (coronary artery disease)     a. s/p PTCA/DES to LAD 07/23/12. b. Mildly elevated trop 04/2013 with stable cath, no culprit.  . Diastolic CHF     a. Prior normal EF 06/2013. b. EF 40-45% by echo 04/2013.  [name] Abnormal CT scan     a. Question of possible renal cyst in RUQ by CT scan 06/2012 at [facility]. b. Redemonstrated 04/2013, pt instructed to f/u PCP.  [name] Hypercholesteremia   . Asthma   . Iron deficiency anemia   . Arthritis     "all over" (05/11/2013)  . Chronic lower back pain   . LBBB (left bundle Shervon Kerwin block)   . CKD (chronic kidney disease)   . Chronic venous insufficiency      Allergies  Allergen Reactions  . Morphine And Related Nausea Only  . Sulfa Antibiotics     unknown  . Sulfa Drugs Cross Reactors     unknown     Current Outpatient Prescriptions  Medication Sig Dispense Refill  . alendronate (FOSAMAX) 70 MG tablet Take 70 mg by mouth every 7 (seven) days. Take  with a full glass of water on an empty stomach.    [name] aspirin 81 MG tablet Take 81 mg by mouth daily.    [name] atorvastatin (LIPITOR) 20 MG tablet Take 20 mg by mouth every evening.    . benazepril (LOTENSIN) 20 MG tablet Take 20 mg by mouth daily.     . carvedilol (COREG) 3.125 MG tablet Take 1 tablet (3.125 mg total) by mouth 2 (two) times daily with a meal. 180 tablet 3  . Cholecalciferol (VITAMIN D-3) 1000 UNITS CAPS Take 5,000 Units by mouth daily.     . clopidogrel (PLAVIX) 75 MG tablet Take 1 tablet by mouth daily.    . diazepam (VALIUM) 2 MG tablet Take 1 tablet by mouth 3 (three) times daily as needed.    . ferrous sulfate 325 (65 FE) MG tablet Take 325 mg by mouth daily with breakfast.    . levothyroxine (SYNTHROID, LEVOTHROID) 75 MCG tablet Take 75 mcg by mouth daily.    . nitroGLYCERIN (NITROSTAT) 0.4 MG SL tablet Place 0.4 mg under the tongue every 5 (five) minutes as needed for chest pain (up to 3 doses).    [name] oxyCODONE-acetaminophen (PERCOCET/ROXICET) 5-325 MG per tablet Take 1 tablet by mouth 2 (two) times  daily as needed.     . pregabalin (LYRICA) 50 MG capsule Take 50 mg by mouth 2 (two) times daily.    . psyllium (REGULOID) 0.52 G capsule Take 0.52 g by mouth 2 (two) times daily.     . ranitidine (ZANTAC) 300 MG tablet Take 1 tablet by mouth daily.    [name] rOPINIRole (REQUIP) 2 MG tablet Take 2 mg by mouth at bedtime.    . torsemide (DEMADEX) 20 MG tablet Take 1 tablet (20 mg total) by mouth daily. As needed for swelling 90 tablet 3   No current facility-administered medications for this visit.     Past Surgical History  Procedure Laterality Date  . Abdominal hysterectomy    . Total knee arthroplasty Right 2000  . Cholecystectomy    . Breast cyst excision Right 1970's  . Coronary angioplasty with stent placement  06/2012    DES proximal LAD [name] 05/11/2013  . Cardiac catheterization  05/11/2013  . Carpal tunnel release Right   . Cataract extraction w/ intraocular lens   implant, bilateral    . Bunionectomy Bilateral   . Hammer toe surgery Bilateral   . Toe surgery      "don't really remember which toe or which side; it was real crooked and had to be straightened" (05/11/2013)  . Left heart catheterization with coronary angiogram N/A 07/23/2012    Procedure: LEFT HEART CATHETERIZATION WITH CORONARY ANGIOGRAM;  Surgeon: Kathleene Hazel, MD;  Location: [facility] CATH LAB;  Service: Cardiovascular;  Laterality: N/A;  . Percutaneous coronary stent intervention (pci-s)  07/23/2012    Procedure: PERCUTANEOUS CORONARY STENT INTERVENTION (PCI-S);  Surgeon: Kathleene Hazel, MD;  Location: [facility] CATH LAB;  Service: Cardiovascular;;  . Left heart catheterization with coronary angiogram N/A 05/11/2013    Procedure: LEFT HEART CATHETERIZATION WITH CORONARY ANGIOGRAM;  Surgeon: Kathleene Hazel, MD;  Location: [facility] CATH LAB;  Service: Cardiovascular;  Laterality: N/A;     Allergies  Allergen Reactions  . Morphine And Related Nausea Only  . Sulfa Antibiotics     unknown  . Sulfa Drugs Cross Reactors     unknown      Family History  Problem Relation Age of Onset  . Breast cancer Mother   . Heart disease Father      Social History Ms. Kiesel reports that she has never smoked. She has never used smokeless tobacco. Ms. Kalama reports that she does not drink alcohol.   Review of Systems CONSTITUTIONAL: No weight loss, fever, chills, weakness or fatigue.  HEENT: Eyes: No visual loss, blurred vision, double vision or yellow sclerae.No hearing loss, sneezing, congestion, runny nose or sore throat.  SKIN: No rash or itching.  CARDIOVASCULAR: per HPI RESPIRATORY: No shortness of breath, cough or sputum.  GASTROINTESTINAL: No anorexia, nausea, vomiting or diarrhea. No abdominal pain or blood.  GENITOURINARY: No burning on urination, no polyuria NEUROLOGICAL: No headache, dizziness, syncope, paralysis, ataxia, numbness or tingling in the extremities. No change in  bowel or bladder control.  MUSCULOSKELETAL: No muscle, back pain, joint pain or stiffness.  LYMPHATICS: No enlarged nodes. No history of splenectomy.  PSYCHIATRIC: No history of depression or anxiety.  ENDOCRINOLOGIC: No reports of sweating, cold or heat intolerance. No polyuria or polydipsia.  [name]   Physical Examination Filed Vitals:   08/03/15 1307  BP: 156/75  Pulse: 65   Filed Vitals:   08/03/15 1307  Height: 5' (1.524 m)  Weight: 157 lb 12.8 oz (71.578 kg)    Gen: resting  comfortably, no acute distress HEENT: no scleral icterus, pupils equal round and reactive, no palptable cervical adenopathy,  CV: RRR, no m/r/g, no JVD Resp: Clear to auscultation bilaterally GI: abdomen is soft, non-tender, non-distended, normal bowel sounds, no hepatosplenomegaly MSK: extremities are warm, no edema.  Skin: warm, no rash Neuro:  no focal deficits Psych: appropriate affect   Diagnostic Studies 04/2013 Cath  Hemodynamic Findings:  Central aortic pressure: 161/60  Left ventricular pressure: 159/8/13  Angiographic Findings:  Left main: No obstructive disease.  Left Anterior Descending Artery: Large caliber vessel that courses to the apex. The proximal vessel has 20% stenosis. The mid vessel has a patent stented segment with no restenosis noted. The diagonal is patent. The mid to distal vessel has a focal 50% smooth stenosis which does not appear to be flow limiting, unchanged in appearance from cath in 2013.  Circumflex Artery: Moderate sized vessel with proximal plaque, 40% smooth stenosis in the mid vessel. The first OM is small and patent. The AV groove Circumflex terminates into a moderate caliber second OM Stepheni Cameron.  Right Coronary Artery: Large, dominant vessel with 30% mid stenosis.  Left Ventricular Angiogram: Deferred.  Impression:  1. Stable single vessel CAD with patent stent proximal to mid LAD  2. Mild to moderate non-obstructive disease in the distal LAD, distal  Circumflex and mid RCA  Recommendations: Continue medical management. Her d-dimer is slightly elevated but CTA chest at [facility] without evidence of PE. Will monitor overnight.   04/2013 Echo  LVEF 40-45%, grade I diastolic dysfunction,   06/2015 echo Study Conclusions  - Left ventricle: The cavity size was normal. There was mild concentric hypertrophy. Systolic function was mildly to moderately reduced. The estimated ejection fraction was in the range of 40% to 45%. Diffuse hypokinesis. Doppler parameters are consistent with abnormal left ventricular relaxation (grade 1 diastolic dysfunction). Elevated filling pressures. - Ventricular septum: Septal motion showed abnormal function and dyssynergy. These changes are consistent with intraventricular conduction delay. - Aortic valve: There was trivial regurgitation. - Mitral valve: Mildly thickened leaflets . There was mild regurgitation. - Left atrium: The atrium was mildly dilated. - Tricuspid valve: There was mild regurgitation. - Pulmonary arteries: PA peak pressure: 38 mm Hg (S). Mildly elevated pulmonary pressures.  06/2015 Lexiscan MPI  LBBB present throughout study.  There is a small defect of mild severity present in the mid anteroseptal, apical anterior and apical septal location. The defect is non-reversible. Consistent with scar versus soft tissue attenuation. No definite ischemia.  This is a low risk study.  Nuclear stress EF: 53%. Mild global hypokinesis.    Assessment and Plan   1. CAD/ICM/Acute on chronic systolic heart failure - recent SOB and chest pain. Repeat echo and Lexiscan without significant new findings - weight is somewhat up from baseline, have asked her to take her torsemide more regularly and follow symptoms     F/u 3 months     Antoine Poche, MD

## 2015-08-03 NOTE — Patient Instructions (Signed)
Your physician wants you to follow-up in: 3 months with Dr. Branch You will receive a reminder letter in the mail two months in advance. If you don't receive a letter, please call our office to schedule the follow-up appointment.  Your physician recommends that you continue on your current medications as directed. Please refer to the Current Medication list given to you today.  Thank you for choosing [facility]!!    

## 2015-11-02 ENCOUNTER — Ambulatory Visit: Payer: Medicare Other | Admitting: Cardiology

## 2015-11-15 ENCOUNTER — Telehealth: Payer: Self-pay | Admitting: Cardiology

## 2015-11-15 NOTE — Telephone Encounter (Signed)
Patient c/o being weak on Sunday morning and say's it felt like her heart was racing with sob and little dizziness. Patient said she felt fine on Monday but on yesterday, patient described having another episode of feeling like her heart was racing with weakness. Patient's blood pressure was taken on yesterday during the episode and BP was 178/76 & HR wasn't taken during that time. No c/o chest pain. While on phone with daughter patient's BP is 187/88 & HR 60 and patient said now she feels fine except a little weakness but no other complaints. In the absence of Dr. Wyline MoodBranch, nurse gave patient an appointment where there was availability on tomorrow with [facility]ath[facility]. Daughter informed that if symptoms got worse between now and tomorrow that patient needed to go to the ED for an evaluation. Daughter verbalized understanding of plan.

## 2015-11-15 NOTE — Telephone Encounter (Signed)
Patient states that her heart start racing on 11-12-15 . Feels like it is about to beat our of her chest. C/o weakness.

## 2015-11-16 ENCOUNTER — Encounter: Payer: Self-pay | Admitting: Adult Health

## 2015-11-16 ENCOUNTER — Ambulatory Visit (INDEPENDENT_AMBULATORY_CARE_PROVIDER_SITE_OTHER): Payer: Medicare Other

## 2015-11-16 ENCOUNTER — Ambulatory Visit (INDEPENDENT_AMBULATORY_CARE_PROVIDER_SITE_OTHER): Payer: Medicare Other | Admitting: Adult Health

## 2015-11-16 VITALS — BP 142/72 | HR 74 | Ht 60.0 in | Wt 150.0 lb

## 2015-11-16 DIAGNOSIS — I10 Essential (primary) hypertension: Secondary | ICD-10-CM

## 2015-11-16 DIAGNOSIS — R000 Tachycardia, unspecified: Secondary | ICD-10-CM

## 2015-11-16 DIAGNOSIS — R002 Palpitations: Secondary | ICD-10-CM

## 2015-11-16 DIAGNOSIS — Z79899 Other long term (current) drug therapy: Secondary | ICD-10-CM | POA: Diagnosis not present

## 2015-11-16 DIAGNOSIS — I2510 Atherosclerotic heart disease of native coronary artery without angina pectoris: Secondary | ICD-10-CM

## 2015-11-16 LAB — CBC
HCT: 40.5 % (ref 36.0–46.0)
Hemoglobin: 13.2 g/dL (ref 12.0–15.0)
MCH: 29.1 pg (ref 26.0–34.0)
MCHC: 32.6 g/dL (ref 30.0–36.0)
MCV: 89.4 fL (ref 78.0–100.0)
MPV: 9.9 fL (ref 8.6–12.4)
PLATELETS: 198 10*3/uL (ref 150–400)
RBC: 4.53 MIL/uL (ref 3.87–5.11)
RDW: 13.8 % (ref 11.5–15.5)
WBC: 6.2 10*3/uL (ref 4.0–10.5)

## 2015-11-16 MED ORDER — CARVEDILOL 6.25 MG PO TABS: 6.2500 mg | ORAL_TABLET | Freq: Two times a day (BID) | ORAL | Status: DC

## 2015-11-16 NOTE — Telephone Encounter (Signed)
Will see

## 2015-11-16 NOTE — Progress Notes (Signed)
Name: Ana Pena    DOB: 09/10/1926  Age: 81 y.o.  MR#: [id number]       PCP:  VYAS,DHRUV B., MD      Insurance: Payor: MEDICARE / Plan: MEDICARE PART A AND B / Product Type: *No Product type* /   CC:   No chief complaint on file.   VS Filed Vitals:   11/16/15 1443  BP: 142/72  Pulse: 74  Height: 5' (1.524 m)  Weight: 150 lb (68.04 kg)  SpO2: 95%    Weights Current Weight  11/16/15 150 lb (68.04 kg)  08/03/15 157 lb 12.8 oz (71.578 kg)  06/23/15 157 lb 12.8 oz (71.578 kg)    Blood Pressure  BP Readings from Last 3 Encounters:  11/16/15 142/72  08/03/15 156/75  06/23/15 111/65     Admit date:  (Not on file) Last encounter with RMR:  Visit date not found   Allergy Morphine and related; Sulfa antibiotics; and Sulfa drugs cross reactors  Current Outpatient Prescriptions  Medication Sig Dispense Refill  . alendronate (FOSAMAX) 70 MG tablet Take 70 mg by mouth every 7 (seven) days. Take with a full glass of water on an empty stomach.    . aspirin 81 MG tablet Take 81 mg by mouth daily.    . atorvastatin (LIPITOR) 20 MG tablet Take 20 mg by mouth every evening.    . benazepril (LOTENSIN) 20 MG tablet Take 20 mg by mouth daily.     . carvedilol (COREG) 3.125 MG tablet Take 1 tablet (3.125 mg total) by mouth 2 (two) times daily with a meal. 180 tablet 3  . Cholecalciferol (VITAMIN D-3) 1000 UNITS CAPS Take 5,000 Units by mouth daily.     . clopidogrel (PLAVIX) 75 MG tablet Take 1 tablet by mouth daily.    . diazepam (VALIUM) 2 MG tablet Take 1 tablet by mouth 3 (three) times daily as needed.    . ferrous sulfate 325 (65 FE) MG tablet Take 325 mg by mouth daily with breakfast.    . levothyroxine (SYNTHROID, LEVOTHROID) 75 MCG tablet Take 75 mcg by mouth daily.    . nitroGLYCERIN (NITROSTAT) 0.4 MG SL tablet Place 0.4 mg under the tongue every 5 (five) minutes as needed for chest pain (up to 3 doses).    . oxyCODONE-acetaminophen (PERCOCET/ROXICET) 5-325 MG per tablet Take 1  tablet by mouth 2 (two) times daily as needed.     . pregabalin (LYRICA) 50 MG capsule Take 50 mg by mouth 2 (two) times daily.    . psyllium (REGULOID) 0.52 G capsule Take 0.52 g by mouth 2 (two) times daily.     . ranitidine (ZANTAC) 300 MG tablet Take 1 tablet by mouth daily.    . rOPINIRole (REQUIP) 2 MG tablet Take 2 mg by mouth at bedtime.    . torsemide (DEMADEX) 20 MG tablet Take 1 tablet (20 mg total) by mouth daily. As needed for swelling 90 tablet 3   No current facility-administered medications for this visit.    Discontinued Meds:   There are no discontinued medications.  Patient Active Problem List   Diagnosis Date Noted  . Cardiomyopathy, ischemic 05/13/2013  . HLD (hyperlipidemia) 09/10/2012  . CAD  s/p LAD stenting 8/13   . Diastolic CHF (HCC)   . Hypertension     LABS    Component Value Date/Time   NA 141 05/12/2013 0540   NA 137 07/24/2012 0500   NA 136 07/23/2012 0148   K 3.9   05/12/2013 0540   K 3.8 07/24/2012 0500   K 3.8 07/23/2012 0148   CL 107 05/12/2013 0540   CL 102 07/24/2012 0500   CL 100 07/23/2012 0148   CO2 27 05/12/2013 0540   CO2 25 07/24/2012 0500   CO2 26 07/23/2012 0148   GLUCOSE 107* 05/12/2013 0540   GLUCOSE 100* 07/24/2012 0500   GLUCOSE 115* 07/23/2012 0148   BUN 26* 05/12/2013 0540   BUN 21 07/24/2012 0500   BUN 21 07/23/2012 0148   CREATININE 1.19* 05/12/2013 0540   CREATININE 1.27* 07/24/2012 0500   CREATININE 1.24* 07/23/2012 0148   CALCIUM 8.8 05/12/2013 0540   CALCIUM 8.9 07/24/2012 0500   CALCIUM 9.1 07/23/2012 0148   GFRNONAA 41* 05/12/2013 0540   GFRNONAA 38* 07/24/2012 0500   GFRNONAA 39* 07/23/2012 0148   GFRAA 47* 05/12/2013 0540   GFRAA 44* 07/24/2012 0500   GFRAA 45* 07/23/2012 0148   CMP     Component Value Date/Time   NA 141 05/12/2013 0540   K 3.9 05/12/2013 0540   CL 107 05/12/2013 0540   CO2 27 05/12/2013 0540   GLUCOSE 107* 05/12/2013 0540   BUN 26* 05/12/2013 0540   CREATININE 1.19*  05/12/2013 0540   CALCIUM 8.8 05/12/2013 0540   GFRNONAA 41* 05/12/2013 0540   GFRAA 47* 05/12/2013 0540       Component Value Date/Time   WBC 4.8 05/12/2013 0540   WBC 6.6 07/24/2012 0500   WBC 7.1 07/23/2012 0148   HGB 10.4* 05/12/2013 0540   HGB 11.0* 07/24/2012 0500   HGB 11.7* 07/23/2012 0148   HCT 31.8* 05/12/2013 0540   HCT 32.8* 07/24/2012 0500   HCT 35.1* 07/23/2012 0148   MCV 83.9 05/12/2013 0540   MCV 89.6 07/24/2012 0500   MCV 88.4 07/23/2012 0148    Lipid Panel     Component Value Date/Time   CHOL 94 05/12/2013 0540   TRIG 109 05/12/2013 0540   HDL 31* 05/12/2013 0540   CHOLHDL 3.0 05/12/2013 0540   VLDL 22 05/12/2013 0540   LDLCALC 41 05/12/2013 0540    ABG No results found for: PHART, PCO2ART, PO2ART, HCO3, TCO2, ACIDBASEDEF, O2SAT   No results found for: TSH BNP (last 3 results) No results for input(s): BNP in the last 8760 hours.  ProBNP (last 3 results) No results for input(s): PROBNP in the last 8760 hours.  Cardiac Panel (last 3 results) No results for input(s): CKTOTAL, CKMB, TROPONINI, RELINDX in the last 72 hours.  Iron/TIBC/Ferritin/ %Sat No results found for: IRON, TIBC, FERRITIN, IRONPCTSAT   EKG Orders placed or performed in visit on 11/16/15  . EKG 12-Lead     Prior Assessment and Plan Problem List as of 11/16/2015      Cardiovascular and Mediastinum   CAD  s/p LAD stenting 8/13   Last Assessment & Plan 10/20/2012 Office Visit Written 10/20/2012  4:43 PM by Iran Ouch, MD    The patient seems to be stable from a cardiac standpoint with no recurrent ischemic events. We discussed today the length of dual antiplatelet therapy after drug-eluting stent. I explained to her and her family that the official FDA recommendation is not to interrupt dual antiplatelet therapy for 12 months after a drug-eluting stent placement. However, there has been recent data suggesting safety of interrupting this therapy after 6 months of treatment in  patients with the newest generation drug-eluting stents. Thus, I feel it's reasonable to stop Plavix for 7 days after 01/23/2013 if needed.  The patient is having significant neck and arm discomfort. Aspirin 81 mg once daily should be continued.      Diastolic CHF [facility](HCC)   Last Assessment & Plan 06/17/2013 Office Visit Written 06/17/2013  1:54 PM by Rande BruntEugene C Serpe, PA-C    Euvolemic by history and exam. Patient instructed to refrain from added salt in her diet, and to weigh herself daily. She is to contact our office for a weight gain of 3 lbs/24 hours. Of note, she has been instructed in the past to use Lasix prn.      Hypertension   Last Assessment & Plan 10/20/2012 Office Visit Written 10/20/2012  4:43 PM by Iran OuchMuhammad A Arida, MD    Her blood pressure is elevated but she seems to be anxious and in some discomfort.      Cardiomyopathy, ischemic   Last Assessment & Plan 06/17/2013 Office Visit Written 06/17/2013  1:51 PM by Rande BruntEugene C Serpe, PA-C    Patient reports no further CP. Results of recent coronary angiogram were reviewed with the patient and her son. In light of these results, and given recent request for her to hold Plavix for 7 days prior to undergoing an epidural injection, I have instructed the patient that she may stop the Plavix altogether. She is now nearly exactly one year out, since undergoing DES of the LAD in August 2013. She is to remain on low-dose ASA, indefinitely.         Other   HLD (hyperlipidemia)   Last Assessment & Plan 10/20/2012 Office Visit Written 10/20/2012  4:43 PM by Iran OuchMuhammad A Arida, MD    The patient reports having a recent lipid profile. Continue treatment with atorvastatin.          Imaging: No results found.

## 2015-11-16 NOTE — Progress Notes (Signed)
Cardiology Office Note   Date:  11/16/2015   ID:  Ana Pena, DOB 07/17/1926, MRN [id number]  PCP:  Ignatius SpeckingVYAS,DHRUV B., MD  Cardiologist: Arlington CalixBranch/  Nhyira Leano, NP   No chief complaint on file.     History of Present Illness: Ana Pena is a 81 y.o. female who presents for ongoing assessment and management of CAD, history of prior DES to LAD in 06/2012 cath 04/2013 with patent LAD stent,  LVEF 40-45% by echo 04/2013, hypertension. She comes today with complaints of rapid HR that occurred while she was taking a shower on Sunday. She states it lasted all through church. She has been sore ever since with intermittent increases in her HR for the last few days. She has some associated dyspnea as well.     Echocardiogram 06/29/2015 Left ventricle: The cavity size was normal. There was mild concentric hypertrophy. Systolic function was mildly to moderately reduced. The estimated ejection fraction was in the range of 40% to 45%. Diffuse hypokinesis. Doppler parameters are consistent with abnormal left ventricular relaxation (grade 1 diastolic dysfunction). Elevated filling pressures. - Ventricular septum: Septal motion showed abnormal function and dyssynergy. These changes are consistent with intraventricular conduction delay. - Aortic valve: There was trivial regurgitation. - Mitral valve: Mildly thickened leaflets . There was mild regurgitation. - Left atrium: The atrium was mildly dilated. - Tricuspid valve: There was mild regurgitation. - Pulmonary arteries: PA peak pressure: 38 mm Hg (S). Mildly elevated pulmonary pressures.  Nuclear medicine study 06/29/2015  LBBB present throughout study.  There is a small defect of mild severity present in the mid anteroseptal, apical anterior and apical septal       location. The defect is non-reversible. Consistent with scar versus soft tissue attenuation. No definite ischemia.  This is a low risk study.  Nuclear stress  EF: 53%. Mild global hypokinesis.   Past Medical History  Diagnosis Date  . Hypertension   . Hypothyroidism   . GERD (gastroesophageal reflux disease)   . H/O hiatal hernia   . Diverticulosis   . CAD (coronary artery disease)     a. s/p PTCA/DES to LAD 07/23/12. b. Mildly elevated trop 04/2013 with stable cath, no culprit.  . Diastolic CHF (HCC)     a. Prior normal EF 06/2013. b. EF 40-45% by echo 04/2013.  [name]. Abnormal CT scan     a. Question of possible renal cyst in RUQ by CT [facility]. b. Redemonstrated 04/2013, pt instructed to f/u PCP.  [name]. Hypercholesteremia   . Asthma   . Iron deficiency anemia   . Arthritis     "all over" (05/11/2013)  . Chronic lower back pain   . LBBB (left bundle branch block)   . CKD (chronic kidney disease)   . Chronic venous insufficiency     Past Surgical History  Procedure Laterality Date  . Abdominal hysterectomy    . Total knee arthroplasty Right 2000  . Cholecystectomy    . Breast cyst excision Right 1970's  . Coronary angioplasty with stent placement  06/2012    DES proximal LAD [name]/notes 05/11/2013  . Cardiac catheterization  05/11/2013  . Carpal tunnel release Right   . Cataract extraction w/ intraocular lens  implant, bilateral    . Bunionectomy Bilateral   . Hammer toe surgery Bilateral   . Toe surgery      "don't really remember which toe or which side; it was real crooked and had to be straightened" (05/11/2013)  .  Left heart catheterization with coronary angiogram N/A 07/23/2012    Procedure: LEFT HEART CATHETERIZATION WITH CORONARY ANGIOGRAM;  Surgeon: Kathleene Hazel, MD;  Location: [facility] CATH LAB;  Service: Cardiovascular;  Laterality: N/A;  . Percutaneous coronary stent intervention (pci-s)  07/23/2012    Procedure: PERCUTANEOUS CORONARY STENT INTERVENTION (PCI-S);  Surgeon: Kathleene Hazel, MD;  Location: [facility] CATH LAB;  Service: Cardiovascular;;  . Left heart catheterization with coronary angiogram N/A 05/11/2013     Procedure: LEFT HEART CATHETERIZATION WITH CORONARY ANGIOGRAM;  Surgeon: Kathleene Hazel, MD;  Location: [facility] CATH LAB;  Service: Cardiovascular;  Laterality: N/A;     Current Outpatient Prescriptions  Medication Sig Dispense Refill  . alendronate (FOSAMAX) 70 MG tablet Take 70 mg by mouth every 7 (seven) days. Take with a full glass of water on an empty stomach.    [name] aspirin 81 MG tablet Take 81 mg by mouth daily.    [name] atorvastatin (LIPITOR) 20 MG tablet Take 20 mg by mouth every evening.    . benazepril (LOTENSIN) 20 MG tablet Take 20 mg by mouth daily.     . carvedilol (COREG) 3.125 MG tablet Take 1 tablet (3.125 mg total) by mouth 2 (two) times daily with a meal. 180 tablet 3  . Cholecalciferol (VITAMIN D-3) 1000 UNITS CAPS Take 5,000 Units by mouth daily.     . clopidogrel (PLAVIX) 75 MG tablet Take 1 tablet by mouth daily.    . diazepam (VALIUM) 2 MG tablet Take 1 tablet by mouth 3 (three) times daily as needed.    . ferrous sulfate 325 (65 FE) MG tablet Take 325 mg by mouth daily with breakfast.    . levothyroxine (SYNTHROID, LEVOTHROID) 75 MCG tablet Take 75 mcg by mouth daily.    . nitroGLYCERIN (NITROSTAT) 0.4 MG SL tablet Place 0.4 mg under the tongue every 5 (five) minutes as needed for chest pain (up to 3 doses).    [name] oxyCODONE-acetaminophen (PERCOCET/ROXICET) 5-325 MG per tablet Take 1 tablet by mouth 2 (two) times daily as needed.     . pregabalin (LYRICA) 50 MG capsule Take 50 mg by mouth 2 (two) times daily.    . psyllium (REGULOID) 0.52 G capsule Take 0.52 g by mouth 2 (two) times daily.     . ranitidine (ZANTAC) 300 MG tablet Take 1 tablet by mouth daily.    [name] rOPINIRole (REQUIP) 2 MG tablet Take 2 mg by mouth at bedtime.    . torsemide (DEMADEX) 20 MG tablet Take 1 tablet (20 mg total) by mouth daily. As needed for swelling 90 tablet 3   No current facility-administered medications for this visit.    Allergies:   Morphine and related; Sulfa antibiotics; and Sulfa  drugs cross reactors    Social History:  The patient  reports that she has never smoked. She has never used smokeless tobacco. She reports that she does not drink alcohol or use illicit drugs.   Family History:  The patient's family history includes Breast cancer in her mother; Heart disease in her father.    ROS: All other systems are reviewed and negative. Unless otherwise mentioned in H&P    PHYSICAL EXAM: VS:  BP 142/72 mmHg  Pulse 74  Ht 5' (1.524 m)  Wt 150 lb (68.04 kg)  BMI 29.30 kg/m2  SpO2 95% , BMI Body mass index is 29.3 kg/(m^2). GEN: Well nourished, well developed, in no acute distress HEENT: normal Neck: no JVD, carotid bruits, or masses Cardiac: RRR  tachycardic with occasional irregular beat. ; no murmurs, rubs, or gallops,no edema  Respiratory:  clear to auscultation bilaterally, normal work of breathing GI: soft, nontender, nondistended, + BS MS: no deformity or atrophy Skin: warm and dry, no rash Neuro:  Strength and sensation are intact Psych: euthymic mood, full affect   EKG: The ekg ordered today demonstrates SR with frequent PAC's LBBB rate of 121 bpm.    Recent Labs: No results found for requested labs within last 365 days.    Lipid Panel    Component Value Date/Time   CHOL 94 05/12/2013 0540   TRIG 109 05/12/2013 0540   HDL 31* 05/12/2013 0540   CHOLHDL 3.0 05/12/2013 0540   VLDL 22 05/12/2013 0540   LDLCALC 41 05/12/2013 0540      Wt Readings from Last 3 Encounters:  11/16/15 150 lb (68.04 kg)  08/03/15 157 lb 12.8 oz (71.578 kg)  06/23/15 157 lb 12.8 oz (71.578 kg)     ASSESSMENT AND PLAN:  1. Rapid HR: This occurred while showering and caused some dyspnea and chest soreness. I will place a cardiac monitor for one week to evaluate for PAF or SVT.[name] She is going out of town to [place] for Christmas. We will place this on her today and get contact information for her location. I will check a BMET, CBC and Mg level. She will increase her  carvedilol to 6.25 mg BID. She will keep her appointment with Dr. Wyline Mood on Jan 5th.   2. Hypertension: Slightly elevated today. Will recheck on follow up with Dr. Wyline Mood in 2 weeks.   3. Hypercholesterolemia: Continue statin.   4. CAD: Stent to LAD in 06/2012. Stress test in August did not show any reversible ischemia. May need to consider repeating this if chest pain is persistent. She describes this as an "ache" with the rapid HR. Will make further recommendations once tests are complete. Continue DAPT.   Current medicines are reviewed at length with the patient today.    Labs/ tests ordered today include: BMET, CBC and Mg. Cardiac monitor.   Orders Placed This Encounter  Procedures  . EKG 12-Lead     Disposition:   FU with on previously scheduled appointment on Jan 5 with Dr. Wyline Mood in [place]. [place].    Signed, Joni Reining, NP  11/16/2015 2:52 PM    [facility] [address], [place], [place] [address] Phone: [phone]; Fax: [phone]

## 2015-11-16 NOTE — Progress Notes (Signed)
Name: Ana Pena    DOB: 03/14/1926  Age: 81 y.o.  MR#: [id number]       PCP:  Ignatius Specking., MD      Insurance: Payor: MEDICARE / Plan: MEDICARE PART A AND B / Product Type: *No Product type* /   CC:   No chief complaint on file.   VS Filed Vitals:   11/16/15 1443  BP: 142/72  Pulse: 74  Height: 5' (1.524 m)  Weight: 150 lb (68.04 kg)  SpO2: 95%    Weights Current Weight  11/16/15 150 lb (68.04 kg)  08/03/15 157 lb 12.8 oz (71.578 kg)  06/23/15 157 lb 12.8 oz (71.578 kg)    Blood Pressure  BP Readings from Last 3 Encounters:  11/16/15 142/72  08/03/15 156/75  06/23/15 111/65     Admit date:  (Not on file) Last encounter with RMR:  Visit date not found   Allergy Morphine and related; Sulfa antibiotics; and Sulfa drugs cross reactors  Current Outpatient Prescriptions  Medication Sig Dispense Refill  . alendronate (FOSAMAX) 70 MG tablet Take 70 mg by mouth every 7 (seven) days. Take with a full glass of water on an empty stomach.    [name] aspirin 81 MG tablet Take 81 mg by mouth daily.    [name] atorvastatin (LIPITOR) 20 MG tablet Take 20 mg by mouth every evening.    . benazepril (LOTENSIN) 20 MG tablet Take 20 mg by mouth daily.     . carvedilol (COREG) 3.125 MG tablet Take 1 tablet (3.125 mg total) by mouth 2 (two) times daily with a meal. 180 tablet 3  . Cholecalciferol (VITAMIN D-3) 1000 UNITS CAPS Take 5,000 Units by mouth daily.     . clopidogrel (PLAVIX) 75 MG tablet Take 1 tablet by mouth daily.    . diazepam (VALIUM) 2 MG tablet Take 1 tablet by mouth 3 (three) times daily as needed.    . ferrous sulfate 325 (65 FE) MG tablet Take 325 mg by mouth daily with breakfast.    . levothyroxine (SYNTHROID, LEVOTHROID) 75 MCG tablet Take 75 mcg by mouth daily.    . nitroGLYCERIN (NITROSTAT) 0.4 MG SL tablet Place 0.4 mg under the tongue every 5 (five) minutes as needed for chest pain (up to 3 doses).    [name] oxyCODONE-acetaminophen (PERCOCET/ROXICET) 5-325 MG per tablet Take 1  tablet by mouth 2 (two) times daily as needed.     . pregabalin (LYRICA) 50 MG capsule Take 50 mg by mouth 2 (two) times daily.    . psyllium (REGULOID) 0.52 G capsule Take 0.52 g by mouth 2 (two) times daily.     . ranitidine (ZANTAC) 300 MG tablet Take 1 tablet by mouth daily.    [name] rOPINIRole (REQUIP) 2 MG tablet Take 2 mg by mouth at bedtime.    . torsemide (DEMADEX) 20 MG tablet Take 1 tablet (20 mg total) by mouth daily. As needed for swelling 90 tablet 3   No current facility-administered medications for this visit.    Discontinued Meds:   There are no discontinued medications.  Patient Active Problem List   Diagnosis Date Noted  . Cardiomyopathy, ischemic 05/13/2013  . HLD (hyperlipidemia) 09/10/2012  . CAD  s/p LAD stenting 8/13   . Diastolic CHF (HCC)   . Hypertension     LABS    Component Value Date/Time   NA 141 05/12/2013 0540   NA 137 07/24/2012 0500   NA 136 07/23/2012 0148   K 3.9  05/12/2013 0540   K 3.8 07/24/2012 0500   K 3.8 07/23/2012 0148   CL 107 05/12/2013 0540   CL 102 07/24/2012 0500   CL 100 07/23/2012 0148   CO2 27 05/12/2013 0540   CO2 25 07/24/2012 0500   CO2 26 07/23/2012 0148   GLUCOSE 107* 05/12/2013 0540   GLUCOSE 100* 07/24/2012 0500   GLUCOSE 115* 07/23/2012 0148   BUN 26* 05/12/2013 0540   BUN 21 07/24/2012 0500   BUN 21 07/23/2012 0148   CREATININE 1.19* 05/12/2013 0540   CREATININE 1.27* 07/24/2012 0500   CREATININE 1.24* 07/23/2012 0148   CALCIUM 8.8 05/12/2013 0540   CALCIUM 8.9 07/24/2012 0500   CALCIUM 9.1 07/23/2012 0148   GFRNONAA 41* 05/12/2013 0540   GFRNONAA 38* 07/24/2012 0500   GFRNONAA 39* 07/23/2012 0148   GFRAA 47* 05/12/2013 0540   GFRAA 44* 07/24/2012 0500   GFRAA 45* 07/23/2012 0148   CMP     Component Value Date/Time   NA 141 05/12/2013 0540   K 3.9 05/12/2013 0540   CL 107 05/12/2013 0540   CO2 27 05/12/2013 0540   GLUCOSE 107* 05/12/2013 0540   BUN 26* 05/12/2013 0540   CREATININE 1.19*  05/12/2013 0540   CALCIUM 8.8 05/12/2013 0540   GFRNONAA 41* 05/12/2013 0540   GFRAA 47* 05/12/2013 0540       Component Value Date/Time   WBC 4.8 05/12/2013 0540   WBC 6.6 07/24/2012 0500   WBC 7.1 07/23/2012 0148   HGB 10.4* 05/12/2013 0540   HGB 11.0* 07/24/2012 0500   HGB 11.7* 07/23/2012 0148   HCT 31.8* 05/12/2013 0540   HCT 32.8* 07/24/2012 0500   HCT 35.1* 07/23/2012 0148   MCV 83.9 05/12/2013 0540   MCV 89.6 07/24/2012 0500   MCV 88.4 07/23/2012 0148    Lipid Panel     Component Value Date/Time   CHOL 94 05/12/2013 0540   TRIG 109 05/12/2013 0540   HDL 31* 05/12/2013 0540   CHOLHDL 3.0 05/12/2013 0540   VLDL 22 05/12/2013 0540   LDLCALC 41 05/12/2013 0540    ABG No results found for: PHART, PCO2ART, PO2ART, HCO3, TCO2, ACIDBASEDEF, O2SAT   No results found for: TSH BNP (last 3 results) No results for input(s): BNP in the last 8760 hours.  ProBNP (last 3 results) No results for input(s): PROBNP in the last 8760 hours.  Cardiac Panel (last 3 results) No results for input(s): CKTOTAL, CKMB, TROPONINI, RELINDX in the last 72 hours.  Iron/TIBC/Ferritin/ %Sat No results found for: IRON, TIBC, FERRITIN, IRONPCTSAT   EKG Orders placed or performed in visit on 11/16/15  . EKG 12-Lead     Prior Assessment and Plan Problem List as of 11/16/2015      Cardiovascular and Mediastinum   CAD  s/p LAD stenting 8/13   Last Assessment & Plan 10/20/2012 Office Visit Written 10/20/2012  4:43 PM by Iran Ouch, MD    The patient seems to be stable from a cardiac standpoint with no recurrent ischemic events. We discussed today the length of dual antiplatelet therapy after drug-eluting stent. I explained to her and her family that the official FDA recommendation is not to interrupt dual antiplatelet therapy for 12 months after a drug-eluting stent placement. However, there has been recent data suggesting safety of interrupting this therapy after 6 months of treatment in  patients with the newest generation drug-eluting stents. Thus, I feel it's reasonable to stop Plavix for 7 days after 01/23/2013 if needed.  The patient is having significant neck and arm discomfort. Aspirin 81 mg once daily should be continued.      Diastolic CHF [facility](HCC)   Last Assessment & Plan 06/17/2013 Office Visit Written 06/17/2013  1:54 PM by Rande BruntEugene C Serpe, PA-C    Euvolemic by history and exam. Patient instructed to refrain from added salt in her diet, and to weigh herself daily. She is to contact our office for a weight gain of 3 lbs/24 hours. Of note, she has been instructed in the past to use Lasix prn.      Hypertension   Last Assessment & Plan 10/20/2012 Office Visit Written 10/20/2012  4:43 PM by Iran OuchMuhammad A Arida, MD    Her blood pressure is elevated but she seems to be anxious and in some discomfort.      Cardiomyopathy, ischemic   Last Assessment & Plan 06/17/2013 Office Visit Written 06/17/2013  1:51 PM by Rande BruntEugene C Serpe, PA-C    Patient reports no further CP. Results of recent coronary angiogram were reviewed with the patient and her son. In light of these results, and given recent request for her to hold Plavix for 7 days prior to undergoing an epidural injection, I have instructed the patient that she may stop the Plavix altogether. She is now nearly exactly one year out, since undergoing DES of the LAD in August 2013. She is to remain on low-dose ASA, indefinitely.         Other   HLD (hyperlipidemia)   Last Assessment & Plan 10/20/2012 Office Visit Written 10/20/2012  4:43 PM by Iran OuchMuhammad A Arida, MD    The patient reports having a recent lipid profile. Continue treatment with atorvastatin.          Imaging: No results found.

## 2015-11-16 NOTE — Patient Instructions (Signed)
Medication Instructions:  INCREASE COREG TO 6.25 TWO TIMES DAILY   Labwork: Your physician recommends that you return for lab work in: ASAP CBC BMET MAGNESIUM   Testing/Procedures: Your physician has recommended that you wear an event monitor. Event monitors are medical devices that record the heart's electrical activity. Doctors most often us these monitors to diagnose arrhythmias. Arrhythmias are problems with the speed or rhythm of the heartbeat. The monitor is a small, portable device. You can wear one while you do your normal daily activities. This is usually used to diagnose what is causing palpitations/syncope (passing out). * WEAR FOR 7 DAYS *    Follow-Up: Your physician recommends that you schedule a follow-up appointment in: KEEP YOUR APPOINTMENT WITH DR. BRANCH January 5. 2017 @ 2:00 in [place]    Any Other Special Instructions Will Be Listed Below (If Applicable).  DURING THE CHRISTMAS PARTY- TAKE IT EASY & LET THE FAMILY DO THE WORK!!     If you need a refill on your cardiac medications before your next appointment, please call your pharmacy.

## 2015-11-17 ENCOUNTER — Telehealth: Payer: Self-pay

## 2015-11-17 LAB — MAGNESIUM: Magnesium: 1.7 mg/dL (ref 1.5–2.5)

## 2015-11-17 LAB — BASIC METABOLIC PANEL
BUN: 29 mg/dL — ABNORMAL HIGH (ref 7–25)
CALCIUM: 9.2 mg/dL (ref 8.6–10.4)
CO2: 26 mmol/L (ref 20–31)
CREATININE: 1.43 mg/dL — AB (ref 0.60–0.88)
Chloride: 103 mmol/L (ref 98–110)
Glucose, Bld: 100 mg/dL — ABNORMAL HIGH (ref 65–99)
Potassium: 4.7 mmol/L (ref 3.5–5.3)
SODIUM: 139 mmol/L (ref 135–146)

## 2015-11-17 NOTE — Telephone Encounter (Signed)
Pt given info below,will start magnesium,copy labs to pcp

## 2015-11-17 NOTE — Telephone Encounter (Signed)
-----   Message from Jodelle GrossKathryn M Lawrence, NP sent at 11/17/2015  7:01 AM EST ----- She is a little dehydrated. Increase fluids. Magnesium is low. Start her on magnesium 200 mg BID. Take 400 mg, at first dose, followed by 200 mg that evening and then 200 mg BID.

## 2015-11-30 ENCOUNTER — Ambulatory Visit (INDEPENDENT_AMBULATORY_CARE_PROVIDER_SITE_OTHER): Payer: Medicare Other | Admitting: Cardiology

## 2015-11-30 ENCOUNTER — Encounter: Payer: Self-pay | Admitting: Cardiology

## 2015-11-30 VITALS — BP 137/72 | HR 57 | Ht 60.0 in | Wt 152.0 lb

## 2015-11-30 DIAGNOSIS — Z79899 Other long term (current) drug therapy: Secondary | ICD-10-CM | POA: Diagnosis not present

## 2015-11-30 DIAGNOSIS — I4891 Unspecified atrial fibrillation: Secondary | ICD-10-CM | POA: Diagnosis not present

## 2015-11-30 DIAGNOSIS — R002 Palpitations: Secondary | ICD-10-CM

## 2015-11-30 MED ORDER — APIXABAN 5 MG PO TABS: 5.0000 mg | ORAL_TABLET | Freq: Two times a day (BID) | ORAL | Status: DC

## 2015-11-30 MED ORDER — AMIODARONE HCL 200 MG PO TABS: 200.0000 mg | ORAL_TABLET | Freq: Two times a day (BID) | ORAL | Status: DC

## 2015-11-30 MED ORDER — CARVEDILOL 3.125 MG PO TABS: 3.1250 mg | ORAL_TABLET | Freq: Two times a day (BID) | ORAL | Status: DC

## 2015-11-30 MED ORDER — AMIODARONE HCL 200 MG PO TABS: 200.0000 mg | ORAL_TABLET | Freq: Every day | ORAL | Status: DC

## 2015-11-30 NOTE — Patient Instructions (Signed)
   Decrease Coreg to 3.125mg  twice a day   Stop Aspirin  Stop Plavix  Begin Amiodarone 200mg  twice a day  X 3 weeks, then decrease to 200mg  DAILY  Begin Eliquis 5mg  twice a day  - samples, free 30-day trial card, & printed scripts given today. Continue all other medications.   Labs for CMET, TSH - orders given today.  Office will contact with results via phone or letter.   1 month follow up with anticoagulation nurse Misty Stanley(Lisa) for new management. Follow up in  6 weeks.

## 2015-11-30 NOTE — Progress Notes (Signed)
Patient ID: Ana RouxMarie W Pena, female   DOB: 09/03/1926, 82 y.o.   MRN: [id number]     Clinical Summary Ana Pena is a [age] y.o.female seen today for follow of the following medical problems. This is a focused visit on recent symptoms of palpitations, for more detailed medical history please refer to prior clinic notes.    1. Palpitations - seen last visit by NP Ana Pena with symptoms of palpitatoins, heart monitor was ordered. Her coreg was also increased to 6.25mg  bid.  - 11/16/15 monitor with reviewed including full disclosure report, shows episodes of afib/aflutter with RVR as well as some sinus bradycardia.   Past Medical History  Diagnosis Date  . Hypertension   . Hypothyroidism   . GERD (gastroesophageal reflux disease)   . H/O hiatal hernia   . Diverticulosis   . CAD (coronary artery disease)     a. s/p PTCA/DES to LAD 07/23/12. b. Mildly elevated trop 04/2013 with stable cath, no culprit.  . Diastolic CHF (HCC)     a. Prior normal EF 06/2013. b. EF 40-45% by echo 04/2013.  [name]. Abnormal CT scan     a. Question of possible renal cyst in RUQ by [facility]. b. Redemonstrated 04/2013, pt instructed to f/u PCP.  [name]. Hypercholesteremia   . Asthma   . Iron deficiency anemia   . Arthritis     "all over" (05/11/2013)  . Chronic lower back pain   . LBBB (left bundle Ana Pena block)   . CKD (chronic kidney disease)   . Chronic venous insufficiency      Allergies  Allergen Reactions  . Morphine And Related Nausea Only  . Sulfa Antibiotics     unknown  . Sulfa Drugs Cross Reactors     unknown     Current Outpatient Prescriptions  Medication Sig Dispense Refill  . alendronate (FOSAMAX) 70 MG tablet Take 70 mg by mouth every 7 (seven) days. Take with a full glass of water on an empty stomach.    [name]. aspirin 81 MG tablet Take 81 mg by mouth daily.    [name]. atorvastatin (LIPITOR) 20 MG tablet Take 20 mg by mouth every evening.    . benazepril (LOTENSIN) 20 MG tablet Take 20 mg by  mouth daily.     . carvedilol (COREG) 6.25 MG tablet Take 1 tablet (6.25 mg total) by mouth 2 (two) times daily. 180 tablet 3  . Cholecalciferol (VITAMIN D-3) 1000 UNITS CAPS Take 5,000 Units by mouth daily.     . clopidogrel (PLAVIX) 75 MG tablet Take 1 tablet by mouth daily.    . diazepam (VALIUM) 2 MG tablet Take 1 tablet by mouth 3 (three) times daily as needed.    . ferrous sulfate 325 (65 FE) MG tablet Take 325 mg by mouth daily with breakfast.    . levothyroxine (SYNTHROID, LEVOTHROID) 75 MCG tablet Take 75 mcg by mouth daily.    . Magnesium Oxide 200 MG TABS Take 200 mg by mouth 2 (two) times daily.    . nitroGLYCERIN (NITROSTAT) 0.4 MG SL tablet Place 0.4 mg under the tongue every 5 (five) minutes as needed for chest pain (up to 3 doses).    [name]. oxyCODONE-acetaminophen (PERCOCET/ROXICET) 5-325 MG per tablet Take 1 tablet by mouth 2 (two) times daily as needed.     . pregabalin (LYRICA) 50 MG capsule Take 50 mg by mouth 2 (two) times daily.    . psyllium (REGULOID) 0.52 G capsule Take 0.52 g by  mouth 2 (two) times daily.     . ranitidine (ZANTAC) 300 MG tablet Take 1 tablet by mouth daily.    [name] rOPINIRole (REQUIP) 2 MG tablet Take 2 mg by mouth at bedtime.    . torsemide (DEMADEX) 20 MG tablet Take 1 tablet (20 mg total) by mouth daily. As needed for swelling 90 tablet 3   No current facility-administered medications for this visit.     Past Surgical History  Procedure Laterality Date  . Abdominal hysterectomy    . Total knee arthroplasty Right 2000  . Cholecystectomy    . Breast cyst excision Right 1970's  . Coronary angioplasty with stent placement  06/2012    DES proximal LAD Ana Pena 05/11/2013  . Cardiac catheterization  05/11/2013  . Carpal tunnel release Right   . Cataract extraction w/ intraocular lens  implant, bilateral    . Bunionectomy Bilateral   . Hammer toe surgery Bilateral   . Toe surgery      "don't really remember which toe or which side; it was real crooked and  had to be straightened" (05/11/2013)  . Left heart catheterization with coronary angiogram N/A 07/23/2012    Procedure: LEFT HEART CATHETERIZATION WITH CORONARY ANGIOGRAM;  Surgeon: Ana Hazel, MD;  Location: [facility] CATH LAB;  Service: Cardiovascular;  Laterality: N/A;  . Percutaneous coronary stent intervention (pci-s)  07/23/2012    Procedure: PERCUTANEOUS CORONARY STENT INTERVENTION (PCI-S);  Surgeon: Ana Hazel, MD;  Location: [facility] CATH LAB;  Service: Cardiovascular;;  . Left heart catheterization with coronary angiogram N/A 05/11/2013    Procedure: LEFT HEART CATHETERIZATION WITH CORONARY ANGIOGRAM;  Surgeon: Ana Hazel, MD;  Location: [facility] CATH LAB;  Service: Cardiovascular;  Laterality: N/A;     Allergies  Allergen Reactions  . Morphine And Related Nausea Only  . Sulfa Antibiotics     unknown  . Sulfa Drugs Cross Reactors     unknown      Family History  Problem Relation Age of Onset  . Breast cancer Mother   . Heart disease Father      Social History Ana Pena reports that she has never smoked. She has never used smokeless tobacco. Ana Pena reports that she does not drink alcohol.   Review of Systems CONSTITUTIONAL: No weight loss, fever, chills, weakness or fatigue.  HEENT: Eyes: No visual loss, blurred vision, double vision or yellow sclerae.No hearing loss, sneezing, congestion, runny nose or sore throat.  SKIN: No rash or itching.  CARDIOVASCULAR: per hpi RESPIRATORY: No shortness of breath, cough or sputum.  GASTROINTESTINAL: No anorexia, nausea, vomiting or diarrhea. No abdominal pain or blood.  GENITOURINARY: No burning on urination, no polyuria NEUROLOGICAL: No headache, dizziness, syncope, paralysis, ataxia, numbness or tingling in the extremities. No change in bowel or bladder control.  MUSCULOSKELETAL: No muscle, back pain, joint pain or stiffness.  LYMPHATICS: No enlarged nodes. No history of splenectomy.  PSYCHIATRIC: No  history of depression or anxiety.  ENDOCRINOLOGIC: No reports of sweating, cold or heat intolerance. No polyuria or polydipsia.  [name]   Physical Examination Filed Vitals:   11/30/15 1357  BP: 137/72  Pulse: 57   Filed Vitals:   11/30/15 1357  Height: 5' (1.524 m)  Weight: 152 lb (68.947 kg)    Gen: resting comfortably, no acute distress HEENT: no scleral icterus, pupils equal round and reactive, no palptable cervical adenopathy,  CV: RRR, no m/r/g, no jvd Resp: Clear to auscultation bilaterally GI: abdomen is soft, non-tender, non-distended, normal bowel  sounds, no hepatosplenomegaly MSK: extremities are warm, no edema.  Skin: warm, no rash Neuro:  no focal deficits Psych: appropriate affect   Diagnostic Studies 04/2013 Cath  Hemodynamic Findings:  Central aortic pressure: 161/60  Left ventricular pressure: 159/8/13  Angiographic Findings:  Left main: No obstructive disease.  Left Anterior Descending Artery: Large caliber vessel that courses to the apex. The proximal vessel has 20% stenosis. The mid vessel has a patent stented segment with no restenosis noted. The diagonal is patent. The mid to distal vessel has a focal 50% smooth stenosis which does not appear to be flow limiting, unchanged in appearance from cath in 2013.  Circumflex Artery: Moderate sized vessel with proximal plaque, 40% smooth stenosis in the mid vessel. The first OM is small and patent. The AV groove Circumflex terminates into a moderate caliber second OM Tametha Banning.  Right Coronary Artery: Large, dominant vessel with 30% mid stenosis.  Left Ventricular Angiogram: Deferred.  Impression:  1. Stable single vessel CAD with patent stent proximal to mid LAD  2. Mild to moderate non-obstructive disease in the distal LAD, distal Circumflex and mid RCA  Recommendations: Continue medical management. Her d-dimer is slightly elevated but CTA chest at [facility] without evidence of PE. Will monitor overnight.    04/2013 Echo  LVEF 40-45%, grade I diastolic dysfunction,   06/2015 echo Study Conclusions  - Left ventricle: The cavity size was normal. There was mild concentric hypertrophy. Systolic function was mildly to moderately reduced. The estimated ejection fraction was in the range of 40% to 45%. Diffuse hypokinesis. Doppler parameters are consistent with abnormal left ventricular relaxation (grade 1 diastolic dysfunction). Elevated filling pressures. - Ventricular septum: Septal motion showed abnormal function and dyssynergy. These changes are consistent with intraventricular conduction delay. - Aortic valve: There was trivial regurgitation. - Mitral valve: Mildly thickened leaflets . There was mild regurgitation. - Left atrium: The atrium was mildly dilated. - Tricuspid valve: There was mild regurgitation. - Pulmonary arteries: PA peak pressure: 38 mm Hg (S). Mildly elevated pulmonary pressures.  06/2015 Lexiscan MPI  LBBB present throughout study.  There is a small defect of mild severity present in the mid anteroseptal, apical anterior and apical septal location. The defect is non-reversible. Consistent with scar versus soft tissue attenuation. No definite ischemia.  This is a low risk study.  Nuclear stress EF: 53%. Mild global hypokinesis.   10/2015 Event monitor 7 day event recorder reviewed, ordered by Ms. Lawrence NP (patient of Dr. Wyline Mood). Predominant rhythm is sinus with bradycardia to the 40s and 50s documented, no pauses. Episodes of PSVT noted in the 160s on December 22 and 24, possibly atrial flutter with 2:1 block. Consider tachycardia-bradycardia syndrome.   Assessment and Plan   1. Afib - new diagnosis by recent monitor. She also appeared to have episode of aflutter, and episdoes sinus bradycardia - due to her CAD and systolic heart failure, as well as intermittent bradycardia medical therapy options are limited - will start amio 200mg   bid x 3 weeks, then 200mg  daily. Check baseline TSH and liver tests - will start eliquis 5mg  bid, will d/c both ASA and plavix.   F/u 6 weeks   Antoine Poche, M.D.

## 2015-12-04 ENCOUNTER — Telehealth: Payer: Self-pay | Admitting: *Deleted

## 2015-12-04 NOTE — Telephone Encounter (Signed)
-----   Message from Antoine PocheJonathan F Branch, MD sent at 12/04/2015  3:16 PM EST ----- Labs look good   Dominga FerryJ Branch MD

## 2015-12-04 NOTE — Telephone Encounter (Signed)
Pt aware, routed to pcp 

## 2015-12-21 ENCOUNTER — Other Ambulatory Visit: Payer: Self-pay | Admitting: Cardiology

## 2015-12-28 ENCOUNTER — Ambulatory Visit (INDEPENDENT_AMBULATORY_CARE_PROVIDER_SITE_OTHER): Payer: Medicare Other | Admitting: *Deleted

## 2015-12-28 DIAGNOSIS — I480 Paroxysmal atrial fibrillation: Secondary | ICD-10-CM

## 2015-12-28 DIAGNOSIS — Z5181 Encounter for therapeutic drug level monitoring: Secondary | ICD-10-CM | POA: Diagnosis not present

## 2015-12-28 DIAGNOSIS — I4892 Unspecified atrial flutter: Secondary | ICD-10-CM

## 2015-12-28 NOTE — Progress Notes (Signed)
Pt was started on Eliquis  bid on 11/30/15 by Dr Wyline Mood for atrial fib/flutter.    Pt complains of being more tired and dizzy since starting Eliquis.  She was started on Amiodarone at the same time.  Told pt I felt these symptoms were coming more from Amiodarone and Coreg than Eliquis.  Amiodarone was decrease to  daily a week ago.  Denies any increased bruising, bleeding or GI upset.  Reviewed patients medication list.  Pt is not currently on any combined P-gp and strong CYP3A4 inhibitors/inducers (ketoconazole, traconazole, ritonavir, carbamazepine, phenytoin, rifampin, St. John's wort).  Reviewed labs from [facility] on 12/28/15.  SCr 1.65, Weight 69kg, CrCl 26.17.  Dose is inappropriate based on 2 out of 3 criteria (age & SCr).   Hgb and HCT: 12.4/37.9   Labs reviewed by Dr Wyline Mood and eliquis decreased to 2.5mg  bid.  A full discussion of the nature of anticoagulants has been carried out.  A benefit/risk analysis has been presented to the patient, so that they understand the justification for choosing anticoagulation with Eliquis at this time.  The need for compliance is stressed.  Pt is aware to take the medication twice daily.  Side effects of potential bleeding are discussed, including unusual colored urine or stools, coughing up blood or coffee ground emesis, nose bleeds or serious fall or head trauma.  Discussed signs and symptoms of stroke. The patient should avoid any OTC items containing aspirin or ibuprofen.  Avoid alcohol consumption.   Call if any signs of abnormal bleeding.  Discussed financial obligations and resolved any difficulty in obtaining medication.  Next lab test test in 3 months.   Reviewed labs and discussed with patient.  Due to elevated kidney function pt informed to decrease Eliquis to 2.5mg  BID and f/u in 3 months. She verbalized understanding.   Appt made for 04/02/16

## 2015-12-29 LAB — BASIC METABOLIC PANEL
BUN / CREAT RATIO: 19 (ref 11–26)
BUN: 31 mg/dL — ABNORMAL HIGH (ref 8–27)
CO2: 24 mmol/L (ref 18–29)
CREATININE: 1.65 mg/dL — AB (ref 0.57–1.00)
Calcium: 8.8 mg/dL (ref 8.7–10.3)
Chloride: 99 mmol/L (ref 96–106)
GFR, EST AFRICAN AMERICAN: 32 mL/min/{1.73_m2} — AB (ref >59)
GFR, EST NON AFRICAN AMERICAN: 28 mL/min/{1.73_m2} — AB (ref >59)
GLUCOSE: 96 mg/dL (ref 65–99)
Potassium: 4.2 mmol/L (ref 3.5–5.2)
SODIUM: 139 mmol/L (ref 134–144)

## 2015-12-29 LAB — CBC
HEMOGLOBIN: 12.4 g/dL (ref 11.1–15.9)
Hematocrit: 37.9 % (ref 34.0–46.6)
MCH: 29 pg (ref 26.6–33.0)
MCHC: 32.7 g/dL (ref 31.5–35.7)
MCV: 89 fL (ref 79–97)
Platelets: 187 10*3/uL (ref 150–379)
RBC: 4.27 x10E6/uL (ref 3.77–5.28)
RDW: 13.7 % (ref 12.3–15.4)
WBC: 7 10*3/uL (ref 3.4–10.8)

## 2016-01-01 ENCOUNTER — Telehealth: Payer: Self-pay | Admitting: *Deleted

## 2016-01-01 DIAGNOSIS — I10 Essential (primary) hypertension: Secondary | ICD-10-CM

## 2016-01-01 MED ORDER — APIXABAN 2.5 MG PO TABS: 2.5000 mg | ORAL_TABLET | Freq: Two times a day (BID) | ORAL | Status: DC

## 2016-01-01 NOTE — Telephone Encounter (Signed)
-----   Message from Antoine Poche, MD sent at 01/01/2016 10:36 AM EST ----- Labs show kidney function is mildly decrease from prior test. How often is she taking her diuretic, please confirm the dosing  Dominga Ferry MD

## 2016-01-01 NOTE — Telephone Encounter (Signed)
Patient called back to make a correction on her directions for torsemide. Patient clarified that she only takes torsemide as needed and usually happens about 1-2 per month.

## 2016-01-01 NOTE — Telephone Encounter (Signed)
Patient informed and verbalized understanding of plan. 

## 2016-01-01 NOTE — Telephone Encounter (Signed)
Patient informed and she confirmed that she is taking torsemide 20 mg daily.

## 2016-01-01 NOTE — Telephone Encounter (Signed)
Thanks for update, encourage her to increase her oral hydration. Given the current kidney function we need to change her eliquis to 2.5mg  bid as well. Please forward labs to pcp, would repeat BMET in 1 month. Drink lots of water daily  J Danette Weinfeld MD

## 2016-01-17 ENCOUNTER — Ambulatory Visit: Payer: Medicare Other | Admitting: Cardiology

## 2016-01-17 NOTE — Progress Notes (Unsigned)
Patient ID: Ana Pena, female   DOB: 1925-12-19, 82 y.o.   MRN: [id number]     Clinical Summary Ana Pena is a 82 y.o.female   1. Afib - seen last visit by NP Lyman Bishop with symptoms of palpitatoins, heart monitor was ordered. Her coreg was also increased to 6.25mg  bid.  - 11/16/15 monitor with reviewed including full disclosure report, shows episodes of afib/aflutter with RVR as well as some sinus bradycardia.    2. CAD/ICM/Chronic systolic heart failure - history of prior DES to LAD in 06/2012  - cath 04/2013 with patent LAD stent  - LVEF 40-45% by echo 04/2013   - last visit described some DOE with short distances. Notes some chest pain at times. Episode while at [facility]. Sharp pain in midchest, 10/10 in severity. No other symptoms. No positional. Lasted 15 minutes. Occurs once every 3-4 weeks. No relation to food.  - we repeated her echo which showed stable LVEF at 40-45%, gradeI diastolic dysfunction.  - Lexiscan MPI 06/2015 showed no ischemia. - since last visit symptoms have continued.    2. HTN  - checks bp at home, systolics typically in 130s/70s. - compliant with meds  3. Hyperlipidemia  - compliant with statin  4. TIA - admitted to [facility] right arm weakness, off balance, facial asymmetry - started back on plavix Past Medical History  Diagnosis Date  . Hypertension   . Hypothyroidism   . GERD (gastroesophageal reflux disease)   . H/O hiatal hernia   . Diverticulosis   . CAD (coronary artery disease)     a. s/p PTCA/DES to LAD 07/23/12. b. Mildly elevated trop 04/2013 with stable cath, no culprit.  . Diastolic CHF (HCC)     a. Prior normal EF 06/2013. b. EF 40-45% by echo 04/2013.  [name] Abnormal CT scan     a. Question of possible renal cyst in RUQ by CT scan 06/2012 at [facility]. b. Redemonstrated 04/2013, pt instructed to f/u PCP.  [name] Hypercholesteremia   . Asthma   . Iron deficiency anemia   . Arthritis     "all over" (05/11/2013)  . Chronic lower  back pain   . LBBB (left bundle Armandina Iman block)   . CKD (chronic kidney disease)   . Chronic venous insufficiency      Allergies  Allergen Reactions  . Morphine And Related Nausea Only  . Sulfa Antibiotics     unknown  . Sulfa Drugs Cross Reactors     unknown     Current Outpatient Prescriptions  Medication Sig Dispense Refill  . alendronate (FOSAMAX) 70 MG tablet Take 70 mg by mouth every 7 (seven) days. Take with a full glass of water on an empty stomach.    [name] amiodarone (PACERONE) 200 MG tablet Take 1 tablet (200 mg total) by mouth daily. 30 tablet 3  . apixaban (ELIQUIS) 2.5 MG TABS tablet Take 1 tablet (2.5 mg total) by mouth 2 (two) times daily. 60 tablet 3  . atorvastatin (LIPITOR) 20 MG tablet Take 20 mg by mouth every evening.    . benazepril (LOTENSIN) 20 MG tablet Take 20 mg by mouth daily.     . carvedilol (COREG) 3.125 MG tablet Take 1 tablet (3.125 mg total) by mouth 2 (two) times daily. 60 tablet 6  . Cholecalciferol (VITAMIN D-3) 1000 UNITS CAPS Take 5,000 Units by mouth daily.     . diazepam (VALIUM) 2 MG tablet Take 1 tablet by mouth 3 (three) times daily as needed.    [name]  ferrous sulfate 325 (65 FE) MG tablet Take 325 mg by mouth daily with breakfast.    . levothyroxine (SYNTHROID, LEVOTHROID) 75 MCG tablet Take 75 mcg by mouth daily.    . Magnesium Oxide 200 MG TABS Take 200 mg by mouth 2 (two) times daily.    . nitroGLYCERIN (NITROSTAT) 0.4 MG SL tablet Place 0.4 mg under the tongue every 5 (five) minutes as needed for chest pain (up to 3 doses).    [name] oxyCODONE-acetaminophen (PERCOCET/ROXICET) 5-325 MG per tablet Take 1 tablet by mouth 2 (two) times daily as needed.     . pregabalin (LYRICA) 50 MG capsule Take 50 mg by mouth 2 (two) times daily.    . psyllium (REGULOID) 0.52 G capsule Take 0.52 g by mouth 2 (two) times daily.     . ranitidine (ZANTAC) 300 MG tablet Take 1 tablet by mouth daily.    [name] torsemide (DEMADEX) 20 MG tablet Take 1 tablet (20 mg total) by  mouth daily. As needed for swelling 90 tablet 3   No current facility-administered medications for this visit.     Past Surgical History  Procedure Laterality Date  . Abdominal hysterectomy    . Total knee arthroplasty Right 2000  . Cholecystectomy    . Breast cyst excision Right 1970's  . Coronary angioplasty with stent placement  06/2012    DES proximal LAD [name] 05/11/2013  . Cardiac catheterization  05/11/2013  . Carpal tunnel release Right   . Cataract extraction w/ intraocular lens  implant, bilateral    . Bunionectomy Bilateral   . Hammer toe surgery Bilateral   . Toe surgery      "don't really remember which toe or which side; it was real crooked and had to be straightened" (05/11/2013)  . Left heart catheterization with coronary angiogram N/A 07/23/2012    Procedure: LEFT HEART CATHETERIZATION WITH CORONARY ANGIOGRAM;  Surgeon: Kathleene Hazel, MD;  Location: [facility] CATH LAB;  Service: Cardiovascular;  Laterality: N/A;  . Percutaneous coronary stent intervention (pci-s)  07/23/2012    Procedure: PERCUTANEOUS CORONARY STENT INTERVENTION (PCI-S);  Surgeon: Kathleene Hazel, MD;  Location: [facility] CATH LAB;  Service: Cardiovascular;;  . Left heart catheterization with coronary angiogram N/A 05/11/2013    Procedure: LEFT HEART CATHETERIZATION WITH CORONARY ANGIOGRAM;  Surgeon: Kathleene Hazel, MD;  Location: [facility] CATH LAB;  Service: Cardiovascular;  Laterality: N/A;     Allergies  Allergen Reactions  . Morphine And Related Nausea Only  . Sulfa Antibiotics     unknown  . Sulfa Drugs Cross Reactors     unknown      Family History  Problem Relation Age of Onset  . Breast cancer Mother   . Heart disease Father      Social History Ana Pena reports that she has never smoked. She has never used smokeless tobacco. Ana Pena reports that she does not drink alcohol.   Review of Systems CONSTITUTIONAL: No weight loss, fever, chills, weakness or fatigue.  HEENT:  Eyes: No visual loss, blurred vision, double vision or yellow sclerae.No hearing loss, sneezing, congestion, runny nose or sore throat.  SKIN: No rash or itching.  CARDIOVASCULAR:  RESPIRATORY: No shortness of breath, cough or sputum.  GASTROINTESTINAL: No anorexia, nausea, vomiting or diarrhea. No abdominal pain or blood.  GENITOURINARY: No burning on urination, no polyuria NEUROLOGICAL: No headache, dizziness, syncope, paralysis, ataxia, numbness or tingling in the extremities. No change in bowel or bladder control.  MUSCULOSKELETAL: No muscle, back pain,  joint pain or stiffness.  LYMPHATICS: No enlarged nodes. No history of splenectomy.  PSYCHIATRIC: No history of depression or anxiety.  ENDOCRINOLOGIC: No reports of sweating, cold or heat intolerance. No polyuria or polydipsia.  [name]   Physical Examination There were no vitals filed for this visit. There were no vitals filed for this visit.  Gen: resting comfortably, no acute distress HEENT: no scleral icterus, pupils equal round and reactive, no palptable cervical adenopathy,  CV Resp: Clear to auscultation bilaterally GI: abdomen is soft, non-tender, non-distended, normal bowel sounds, no hepatosplenomegaly MSK: extremities are warm, no edema.  Skin: warm, no rash Neuro:  no focal deficits Psych: appropriate affect   Diagnostic Studies 04/2013 Cath  Hemodynamic Findings:  Central aortic pressure: 161/60  Left ventricular pressure: 159/8/13  Angiographic Findings:  Left main: No obstructive disease.  Left Anterior Descending Artery: Large caliber vessel that courses to the apex. The proximal vessel has 20% stenosis. The mid vessel has a patent stented segment with no restenosis noted. The diagonal is patent. The mid to distal vessel has a focal 50% smooth stenosis which does not appear to be flow limiting, unchanged in appearance from cath in 2013.  Circumflex Artery: Moderate sized vessel with proximal plaque, 40% smooth  stenosis in the mid vessel. The first OM is small and patent. The AV groove Circumflex terminates into a moderate caliber second OM Myalee Stengel.  Right Coronary Artery: Large, dominant vessel with 30% mid stenosis.  Left Ventricular Angiogram: Deferred.  Impression:  1. Stable single vessel CAD with patent stent proximal to mid LAD  2. Mild to moderate non-obstructive disease in the distal LAD, distal Circumflex and mid RCA  Recommendations: Continue medical management. Her d-dimer is slightly elevated but CTA chest at [facility] without evidence of PE. Will monitor overnight.   04/2013 Echo  LVEF 40-45%, grade I diastolic dysfunction,   06/2015 echo Study Conclusions  - Left ventricle: The cavity size was normal. There was mild concentric hypertrophy. Systolic function was mildly to moderately reduced. The estimated ejection fraction was in the range of 40% to 45%. Diffuse hypokinesis. Doppler parameters are consistent with abnormal left ventricular relaxation (grade 1 diastolic dysfunction). Elevated filling pressures. - Ventricular septum: Septal motion showed abnormal function and dyssynergy. These changes are consistent with intraventricular conduction delay. - Aortic valve: There was trivial regurgitation. - Mitral valve: Mildly thickened leaflets . There was mild regurgitation. - Left atrium: The atrium was mildly dilated. - Tricuspid valve: There was mild regurgitation. - Pulmonary arteries: PA peak pressure: 38 mm Hg (S). Mildly elevated pulmonary pressures.  06/2015 Lexiscan MPI  LBBB present throughout study.  There is a small defect of mild severity present in the mid anteroseptal, apical anterior and apical septal location. The defect is non-reversible. Consistent with scar versus soft tissue attenuation. No definite ischemia.  This is a low risk study.  Nuclear stress EF: 53%. Mild global hypokinesis.   10/2015 Event monitor 7 day event  recorder reviewed, ordered by Ms. Lawrence NP (patient of Dr. Wyline Mood). Predominant rhythm is sinus with bradycardia to the 40s and 50s documented, no pauses. Episodes of PSVT noted in the 160s on December 22 and 24, possibly atrial flutter with 2:1 block. Consider tachycardia-bradycardia syndrome.     Assessment and Plan  1. Afib - new diagnosis by recent monitor. She also appeared to have episode of aflutter, and episdoes sinus bradycardia - due to her CAD and systolic heart failure, as well as intermittent bradycardia medical therapy options are  limited - will start amio 200mg  bid x 3 weeks, then 200mg  daily. Check baseline TSH and liver tests - will start eliquis 5mg  bid, will d/c both ASA and plavix.          F/u 6 weeks      Antoine Poche, M.D., F.A.C.C.

## 2016-02-06 ENCOUNTER — Ambulatory Visit (INDEPENDENT_AMBULATORY_CARE_PROVIDER_SITE_OTHER): Payer: Medicare Other | Admitting: Cardiology

## 2016-02-06 ENCOUNTER — Encounter (INDEPENDENT_AMBULATORY_CARE_PROVIDER_SITE_OTHER): Payer: Self-pay | Admitting: *Deleted

## 2016-02-06 ENCOUNTER — Encounter: Payer: Self-pay | Admitting: Cardiology

## 2016-02-06 VITALS — BP 206/80 | HR 53 | Ht 60.0 in | Wt 152.0 lb

## 2016-02-06 DIAGNOSIS — R5382 Chronic fatigue, unspecified: Secondary | ICD-10-CM | POA: Diagnosis not present

## 2016-02-06 DIAGNOSIS — I5022 Chronic systolic (congestive) heart failure: Secondary | ICD-10-CM | POA: Diagnosis not present

## 2016-02-06 DIAGNOSIS — I4891 Unspecified atrial fibrillation: Secondary | ICD-10-CM

## 2016-02-06 DIAGNOSIS — I2510 Atherosclerotic heart disease of native coronary artery without angina pectoris: Secondary | ICD-10-CM

## 2016-02-06 NOTE — Patient Instructions (Signed)
Your physician recommends that you schedule a follow-up appointment in: 3 months with Dr. Wyline MoodBranch   Your physician has recommended you make the following change in your medication:   STOP COREG FOR 2 WEEKS AND CALL US WITH AN UPDATE ON YOUR FATIGUE  RECORD YOU BLOOD PRESSURE AT HOME UNTIL Friday AND CALL US WITH READINGS.  Thank you for choosing [facility]!!

## 2016-02-06 NOTE — Progress Notes (Addendum)
Patient ID: Eduard RouxMarie W Hammock, female   DOB: 08/10/1926, 82 y.o.   MRN: [id number]     Clinical Summary Ms. Mayer Camelatum is a 82 y.o.female seen today for follow up of the following medical problems.   1. Afib - 11/16/15 monitor with reviewed including full disclosure report, shows episodes of afib/aflutter with RVR as well as some sinus bradycardia.  - on admiodarone and eliquis - denies any palpitations.   2. CAD/ICM/Chronic systolic heart failure - history of prior DES to LAD in 06/2012  - cath 04/2013 with patent LAD stent  - LVEF 40-45% by echo 04/2013   - at a prior visit described some DOE with short distances. Notes some chest pain at times. Episode while [facility]. Sharp pain in midchest, 10/10 in severity. No other symptoms. No positional. Lasted 15 minutes. Occurs once every 3-4 weeks. No relation to food.  - we repeated her echo which showed stable LVEF at 40-45%, gradeI diastolic dysfunction.  - Lexiscan MPI 06/2015 showed no ischemia. - since last visit symptoms of fatigue have continued. There onset seems to correlate with restarting her coreg in July 2016   3. Chronic fatigue - going on few months now, x 3-4 months. Onset seems to correlate with restarting her coreg    Past Medical History  Diagnosis Date  . Hypertension   . Hypothyroidism   . GERD (gastroesophageal reflux disease)   . H/O hiatal hernia   . Diverticulosis   . CAD (coronary artery disease)     a. s/p PTCA/DES to LAD 07/23/12. b. Mildly elevated trop 04/2013 with stable cath, no culprit.  . Diastolic CHF (HCC)     a. Prior normal EF 06/2013. b. EF 40-45% by echo 04/2013.  [name]. Abnormal CT scan     a. Question of possible renal cyst in RUQ by CT [facility]. b. Redemonstrated 04/2013, pt instructed to f/u PCP.  [name]. Hypercholesteremia   . Asthma   . Iron deficiency anemia   . Arthritis     "all over" (05/11/2013)  . Chronic lower back pain   . LBBB (left bundle Lekisha Mcghee block)   . CKD  (chronic kidney disease)   . Chronic venous insufficiency      Allergies  Allergen Reactions  . Morphine And Related Nausea Only  . Sulfa Antibiotics     unknown  . Sulfa Drugs Cross Reactors     unknown     Current Outpatient Prescriptions  Medication Sig Dispense Refill  . alendronate (FOSAMAX) 70 MG tablet Take 70 mg by mouth every 7 (seven) days. Take with a full glass of water on an empty stomach.    [name]. amiodarone (PACERONE) 200 MG tablet Take 1 tablet (200 mg total) by mouth daily. 30 tablet 3  . apixaban (ELIQUIS) 2.5 MG TABS tablet Take 1 tablet (2.5 mg total) by mouth 2 (two) times daily. 60 tablet 3  . atorvastatin (LIPITOR) 20 MG tablet Take 20 mg by mouth every evening.    . benazepril (LOTENSIN) 20 MG tablet Take 20 mg by mouth daily.     . carvedilol (COREG) 3.125 MG tablet Take 1 tablet (3.125 mg total) by mouth 2 (two) times daily. 60 tablet 6  . Cholecalciferol (VITAMIN D-3) 1000 UNITS CAPS Take 5,000 Units by mouth daily.     . diazepam (VALIUM) 2 MG tablet Take 1 tablet by mouth 3 (three) times daily as needed.    . ferrous sulfate 325 (65 FE) MG tablet  Take 325 mg by mouth daily with breakfast.    . levothyroxine (SYNTHROID, LEVOTHROID) 75 MCG tablet Take 75 mcg by mouth daily.    . Magnesium Oxide 200 MG TABS Take 200 mg by mouth 2 (two) times daily.    . nitroGLYCERIN (NITROSTAT) 0.4 MG SL tablet Place 0.4 mg under the tongue every 5 (five) minutes as needed for chest pain (up to 3 doses).    [name] oxyCODONE-acetaminophen (PERCOCET/ROXICET) 5-325 MG per tablet Take 1 tablet by mouth 2 (two) times daily as needed.     . pregabalin (LYRICA) 50 MG capsule Take 50 mg by mouth 2 (two) times daily.    . psyllium (REGULOID) 0.52 G capsule Take 0.52 g by mouth 2 (two) times daily.     . ranitidine (ZANTAC) 300 MG tablet Take 1 tablet by mouth daily.    [name] torsemide (DEMADEX) 20 MG tablet Take 1 tablet (20 mg total) by mouth daily. As needed for swelling 90 tablet 3   No  current facility-administered medications for this visit.     Past Surgical History  Procedure Laterality Date  . Abdominal hysterectomy    . Total knee arthroplasty Right 2000  . Cholecystectomy    . Breast cyst excision Right 1970's  . Coronary angioplasty with stent placement  06/2012    DES proximal LAD [name] 05/11/2013  . Cardiac catheterization  05/11/2013  . Carpal tunnel release Right   . Cataract extraction w/ intraocular lens  implant, bilateral    . Bunionectomy Bilateral   . Hammer toe surgery Bilateral   . Toe surgery      "don't really remember which toe or which side; it was real crooked and had to be straightened" (05/11/2013)  . Left heart catheterization with coronary angiogram N/A 07/23/2012    Procedure: LEFT HEART CATHETERIZATION WITH CORONARY ANGIOGRAM;  Surgeon: Kathleene Hazel, MD;  Location: [facility] CATH LAB;  Service: Cardiovascular;  Laterality: N/A;  . Percutaneous coronary stent intervention (pci-s)  07/23/2012    Procedure: PERCUTANEOUS CORONARY STENT INTERVENTION (PCI-S);  Surgeon: Kathleene Hazel, MD;  Location: [facility] CATH LAB;  Service: Cardiovascular;;  . Left heart catheterization with coronary angiogram N/A 05/11/2013    Procedure: LEFT HEART CATHETERIZATION WITH CORONARY ANGIOGRAM;  Surgeon: Kathleene Hazel, MD;  Location: [facility] CATH LAB;  Service: Cardiovascular;  Laterality: N/A;     Allergies  Allergen Reactions  . Morphine And Related Nausea Only  . Sulfa Antibiotics     unknown  . Sulfa Drugs Cross Reactors     unknown      Family History  Problem Relation Age of Onset  . Breast cancer Mother   . Heart disease Father      Social History Ms. Havener reports that she has never smoked. She has never used smokeless tobacco. Ms. Mulkern reports that she does not drink alcohol.   Review of Systems CONSTITUTIONAL:+fatigue HEENT: Eyes: No visual loss, blurred vision, double vision or yellow sclerae.No hearing loss, sneezing,  congestion, runny nose or sore throat.  SKIN: No rash or itching.  CARDIOVASCULAR: RRR, no m/r,g no jvd RESPIRATORY: No shortness of breath, cough or sputum.  GASTROINTESTINAL: No anorexia, nausea, vomiting or diarrhea. No abdominal pain or blood.  GENITOURINARY: No burning on urination, no polyuria NEUROLOGICAL: No headache, dizziness, syncope, paralysis, ataxia, numbness or tingling in the extremities. No change in bowel or bladder control.  MUSCULOSKELETAL: No muscle, back pain, joint pain or stiffness.  LYMPHATICS: No enlarged nodes. No history of  splenectomy.  PSYCHIATRIC: No history of depression or anxiety.  ENDOCRINOLOGIC: No reports of sweating, cold or heat intolerance. No polyuria or polydipsia.  [name]   Physical Examination Filed Vitals:   02/06/16 1510  BP: 206/80  Pulse: 53   Filed Vitals:   02/06/16 1510  Height: 5' (1.524 m)  Weight: 152 lb (68.947 kg)    Gen: resting comfortably, no acute distress HEENT: no scleral icterus, pupils equal round and reactive, no palptable cervical adenopathy,  CV: RRR, no m/r/g, no jvd Resp: Clear to auscultation bilaterally GI: abdomen is soft, non-tender, non-distended, normal bowel sounds, no hepatosplenomegaly MSK: extremities are warm, no edema.  Skin: warm, no rash Neuro:  no focal deficits Psych: appropriate affect   Diagnostic Studies 04/2013 Cath  Hemodynamic Findings:  Central aortic pressure: 161/60  Left ventricular pressure: 159/8/13  Angiographic Findings:  Left main: No obstructive disease.  Left Anterior Descending Artery: Large caliber vessel that courses to the apex. The proximal vessel has 20% stenosis. The mid vessel has a patent stented segment with no restenosis noted. The diagonal is patent. The mid to distal vessel has a focal 50% smooth stenosis which does not appear to be flow limiting, unchanged in appearance from cath in 2013.  Circumflex Artery: Moderate sized vessel with proximal plaque, 40%  smooth stenosis in the mid vessel. The first OM is small and patent. The AV groove Circumflex terminates into a moderate caliber second OM Jasmynn Pfalzgraf.  Right Coronary Artery: Large, dominant vessel with 30% mid stenosis.  Left Ventricular Angiogram: Deferred.  Impression:  1. Stable single vessel CAD with patent stent proximal to mid LAD  2. Mild to moderate non-obstructive disease in the distal LAD, distal Circumflex and mid RCA  Recommendations: Continue medical management. Her d-dimer is slightly elevated but [facility] chest at [facility] without evidence of PE. Will monitor overnight.   04/2013 Echo  LVEF 40-45%, grade I diastolic dysfunction,   06/2015 echo Study Conclusions  - Left ventricle: The cavity size was normal. There was mild concentric hypertrophy. Systolic function was mildly to moderately reduced. The estimated ejection fraction was in the range of 40% to 45%. Diffuse hypokinesis. Doppler parameters are consistent with abnormal left ventricular relaxation (grade 1 diastolic dysfunction). Elevated filling pressures. - Ventricular septum: Septal motion showed abnormal function and dyssynergy. These changes are consistent with intraventricular conduction delay. - Aortic valve: There was trivial regurgitation. - Mitral valve: Mildly thickened leaflets . There was mild regurgitation. - Left atrium: The atrium was mildly dilated. - Tricuspid valve: There was mild regurgitation. - Pulmonary arteries: PA peak pressure: 38 mm Hg (S). Mildly elevated pulmonary pressures.  06/2015 Lexiscan MPI  LBBB present throughout study.  There is a small defect of mild severity present in the mid anteroseptal, apical anterior and apical septal location. The defect is non-reversible. Consistent with scar versus soft tissue attenuation. No definite ischemia.  This is a low risk study.  Nuclear stress EF: 53%. Mild global hypokinesis.      Assessment and Plan    1.  Afib -fairly  new diagnosis by recent monitor. She also appeared to have episode of aflutter, and episdoes sinus bradycardia - due to her CAD and systolic heart failure, as well as intermittent bradycardia medical therapy options are limited -she is doing well on amio 200mg  maintaining SR, will continue. Continue eliquis for stroke prevention - EKG in clinc shows SR, LBBB   2. CAD/ICM/Chronic systolic HF - appears euvolemic - due to fatigue will try stopping  coreg  3. Generalized fatigue - will try stopping coreg and follow symptoms.   4. HTN - elevated in clinic, she has recent medical visit with welll controlled bp. She will keep bp log and contact us later this week. If elevated consider norvasc, would not increase benazepril due to renal function.    F/u 3 moonths   Antoine Poche, M.D

## 2016-02-09 ENCOUNTER — Encounter (INDEPENDENT_AMBULATORY_CARE_PROVIDER_SITE_OTHER): Payer: Self-pay

## 2016-02-09 ENCOUNTER — Encounter (INDEPENDENT_AMBULATORY_CARE_PROVIDER_SITE_OTHER): Payer: Self-pay | Admitting: *Deleted

## 2016-02-09 ENCOUNTER — Encounter (INDEPENDENT_AMBULATORY_CARE_PROVIDER_SITE_OTHER): Payer: Self-pay | Admitting: Internal Medicine

## 2016-02-09 ENCOUNTER — Ambulatory Visit (INDEPENDENT_AMBULATORY_CARE_PROVIDER_SITE_OTHER): Payer: Medicare Other | Admitting: Internal Medicine

## 2016-02-09 ENCOUNTER — Other Ambulatory Visit (INDEPENDENT_AMBULATORY_CARE_PROVIDER_SITE_OTHER): Payer: Self-pay | Admitting: Internal Medicine

## 2016-02-09 ENCOUNTER — Telehealth: Payer: Self-pay | Admitting: *Deleted

## 2016-02-09 VITALS — BP 150/80 | HR 60 | Temp 98.0°F | Ht 60.0 in | Wt 150.9 lb

## 2016-02-09 DIAGNOSIS — R110 Nausea: Secondary | ICD-10-CM

## 2016-02-09 DIAGNOSIS — K219 Gastro-esophageal reflux disease without esophagitis: Secondary | ICD-10-CM

## 2016-02-09 DIAGNOSIS — I2510 Atherosclerotic heart disease of native coronary artery without angina pectoris: Secondary | ICD-10-CM

## 2016-02-09 NOTE — Patient Instructions (Addendum)
EGD. The risks and benefits such as perforation, bleeding, and infection were reviewed with the patient and is agreeable. Dexilant 30 minutes before breakfast.

## 2016-02-09 NOTE — Telephone Encounter (Signed)
Pt dropped off BP log: Will forward to Dr. Wyline MoodBranch   02/07/16  183/79  HR 59 02/08/16   205/86 HR    79 02/08/16   198/78 HR    79 02/09/16   198/74 HR    62 02/09/16   150/80 HR    60 - this BP was taken by nurse at GI dr office

## 2016-02-09 NOTE — Progress Notes (Signed)
   Subjective:    Patient ID: Ana Pena, female    DOB: 12/23/1925, 82 y.o.   MRN: [id number]  HPI Referred by Dr. Sherril CroonVyas for abdominal pain/EGD. Patient states she has nausea. She feels like she needs to vomit, but she can't. She says sometimes she can look at food and she may get nausea. Similar symptoms years ago but resolved.  She saw Dr. Sherril CroonVyas and was given Dexilant but this really did not help.  She has nausea most every day. In the morning, she has nausea. Sometimes she feels hot water boiling up in her mouth. She does have acid reflux at time. Has been taking Dexilant the middle of the day. Symptoms for two months.  She denies any abdominal pain. She has a BM daily. No melena or BRRB. Appetite is good. No weight loss. Takes Fosamax and stay up at least an hour after taking. Hx significant cardiac and maintained on Eliquis. 01/03/2009 Colonoscopy: Chronic seepage, Dr. Karilyn Cotaehman: Pancolonic diveriticulosis. No evidence of colitis or stricture.  Review of Systems Alert and oriented. Skin warm and dry. Oral mucosa is moist.   . Sclera anicteric, conjunctivae is pink. Thyroid not enlarged. No cervical lymphadenopathy. Lungs clear. Heart regular rate and rhythm.  Abdomen is soft. Bowel sounds are positive. No hepatomegaly. No abdominal masses felt. No tenderness.  No edema to lower extremities.         Objective:   Physical Exam Blood pressure 150/80, pulse 60, temperature 98 F (36.7 C), height 5' (1.524 m), weight 150 lb 14.4 oz (68.448 kg). Alert and oriented. Skin warm and dry. Oral mucosa is moist.   . Sclera anicteric, conjunctivae is pink. Thyroid not enlarged. No cervical lymphadenopathy. Lungs clear. Heart regular rate and rhythm.  Abdomen is soft. Bowel sounds are positive. No hepatomegaly. No abdominal masses felt. No tenderness.  No edema to lower extremities.          Assessment & Plan:  Nausea, GERD. PUD needs to be ruled out.  Take Dexilant 30 minutes before breakfast.    EGD. The risks and benefits such as perforation, bleeding, and infection were reviewed with the patient and is agreeable.

## 2016-02-12 NOTE — Telephone Encounter (Signed)
Have her start norvasc 5mg  daily and submit bp log in 2 weeks  Dominga FerryJ Branch MD

## 2016-02-13 MED ORDER — AMLODIPINE BESYLATE 5 MG PO TABS: 5.0000 mg | ORAL_TABLET | Freq: Every day | ORAL | Status: DC

## 2016-02-13 NOTE — Telephone Encounter (Signed)
Pt aware and med sent to pharmacy. Pt will f/u in 2 weeks with BP log

## 2016-02-14 ENCOUNTER — Ambulatory Visit (HOSPITAL_COMMUNITY)
Admission: RE | Admit: 2016-02-14 | Discharge: 2016-02-14 | Disposition: A | Discharge status: Home / Self Care | Payer: Medicare Other | Source: Ambulatory Visit | Attending: Internal Medicine | Admitting: Internal Medicine

## 2016-02-14 ENCOUNTER — Encounter (HOSPITAL_COMMUNITY)
Admission: RE | Disposition: A | Discharge status: Home / Self Care | Payer: Self-pay | Source: Ambulatory Visit | Attending: Internal Medicine

## 2016-02-14 ENCOUNTER — Encounter (HOSPITAL_COMMUNITY): Payer: Self-pay | Admitting: *Deleted

## 2016-02-14 DIAGNOSIS — E7800 Pure hypercholesterolemia, unspecified: Secondary | ICD-10-CM | POA: Insufficient documentation

## 2016-02-14 DIAGNOSIS — E039 Hypothyroidism, unspecified: Secondary | ICD-10-CM | POA: Diagnosis not present

## 2016-02-14 DIAGNOSIS — I129 Hypertensive chronic kidney disease with stage 1 through stage 4 chronic kidney disease, or unspecified chronic kidney disease: Secondary | ICD-10-CM | POA: Diagnosis not present

## 2016-02-14 DIAGNOSIS — Z7901 Long term (current) use of anticoagulants: Secondary | ICD-10-CM | POA: Diagnosis not present

## 2016-02-14 DIAGNOSIS — Z96651 Presence of right artificial knee joint: Secondary | ICD-10-CM | POA: Diagnosis not present

## 2016-02-14 DIAGNOSIS — M1991 Primary osteoarthritis, unspecified site: Secondary | ICD-10-CM | POA: Diagnosis not present

## 2016-02-14 DIAGNOSIS — Z79899 Other long term (current) drug therapy: Secondary | ICD-10-CM | POA: Diagnosis not present

## 2016-02-14 DIAGNOSIS — K219 Gastro-esophageal reflux disease without esophagitis: Secondary | ICD-10-CM | POA: Insufficient documentation

## 2016-02-14 DIAGNOSIS — I2510 Atherosclerotic heart disease of native coronary artery without angina pectoris: Secondary | ICD-10-CM | POA: Insufficient documentation

## 2016-02-14 DIAGNOSIS — R112 Nausea with vomiting, unspecified: Secondary | ICD-10-CM

## 2016-02-14 DIAGNOSIS — N189 Chronic kidney disease, unspecified: Secondary | ICD-10-CM | POA: Diagnosis not present

## 2016-02-14 DIAGNOSIS — K3189 Other diseases of stomach and duodenum: Secondary | ICD-10-CM

## 2016-02-14 DIAGNOSIS — K2970 Gastritis, unspecified, without bleeding: Secondary | ICD-10-CM

## 2016-02-14 DIAGNOSIS — Z803 Family history of malignant neoplasm of breast: Secondary | ICD-10-CM | POA: Diagnosis not present

## 2016-02-14 DIAGNOSIS — R109 Unspecified abdominal pain: Secondary | ICD-10-CM | POA: Diagnosis not present

## 2016-02-14 DIAGNOSIS — R110 Nausea: Secondary | ICD-10-CM

## 2016-02-14 DIAGNOSIS — K228 Other specified diseases of esophagus: Secondary | ICD-10-CM | POA: Diagnosis not present

## 2016-02-14 HISTORY — PX: ESOPHAGOGASTRODUODENOSCOPY: SHX5428

## 2016-02-14 SURGERY — EGD (ESOPHAGOGASTRODUODENOSCOPY)
Anesthesia: Moderate Sedation

## 2016-02-14 MED ORDER — MIDAZOLAM HCL 5 MG/5ML IJ SOLN
INTRAMUSCULAR | Status: DC | PRN
  Administered 2016-02-14: 1 mg via INTRAVENOUS

## 2016-02-14 MED ORDER — MEPERIDINE HCL 50 MG/ML IJ SOLN
INTRAMUSCULAR | Status: DC | PRN
  Administered 2016-02-14: 25 mg via INTRAVENOUS

## 2016-02-14 MED ORDER — MIDAZOLAM HCL 5 MG/5ML IJ SOLN
INTRAMUSCULAR | Status: AC
  Filled 2016-02-14: qty 10

## 2016-02-14 MED ORDER — SODIUM CHLORIDE 0.9 % IV SOLN
INTRAVENOUS | Status: DC
  Administered 2016-02-14: 1000 mL via INTRAVENOUS

## 2016-02-14 MED ORDER — ONDANSETRON HCL 4 MG PO TABS: 4.0000 mg | ORAL_TABLET | Freq: Two times a day (BID) | ORAL | Status: DC | PRN

## 2016-02-14 MED ORDER — MEPERIDINE HCL 50 MG/ML IJ SOLN
INTRAMUSCULAR | Status: AC
  Filled 2016-02-14: qty 1

## 2016-02-14 MED ORDER — BUTAMBEN-TETRACAINE-BENZOCAINE 2-2-14 % EX AERO
INHALATION_SPRAY | CUTANEOUS | Status: DC | PRN
  Administered 2016-02-14: 1 via TOPICAL

## 2016-02-14 NOTE — Op Note (Signed)
[facility] Patient Name: Ana OchsMarie Pena Procedure Date: 02/14/2016 9:07 AM MRN: [id number] Date of Birth: 04/17/1926 Attending MD: Lionel DecemberNajeeb Rehman , MD CSN: [id number] Age: 82 Admit Type: Outpatient Procedure:                Upper GI endoscopy Indications:              Gastro-esophageal reflux disease. Nausea.GERD                            symptoms are poorly controlled. Providers:                Lionel DecemberNajeeb Rehman, MD, Nena PolioLisa Moore, RN, Calton Dachaylor Lemons,                            Technician Referring MD:             Ignatius Speckinghruv B. Vyas, MD Medicines:                Cetacaine spray, Meperidine 25 mg IV, Midazolam 1                            mg IV Complications:            No immediate complications. Estimated Blood Loss:     Estimated blood loss: none. Procedure:                Pre-Anesthesia Assessment:                           - Prior to the procedure, a History and Physical                            was performed, and patient medications and                            allergies were reviewed. The patient's tolerance of                            previous anesthesia was also reviewed. The risks                            and benefits of the procedure and the sedation                            options and risks were discussed with the patient.                            All questions were answered, and informed consent                            was obtained. Prior Anticoagulants: The patient                            last took Eliquis (apixaban) 1 day prior to the  procedure. ASA Grade Assessment: II - A patient                            with mild systemic disease. After reviewing the                            risks and benefits, the patient was deemed in                            satisfactory condition to undergo the procedure.                           After obtaining informed consent, the endoscope was                            passed under direct vision.  Throughout the                            procedure, the patient's blood pressure, pulse, and                            oxygen saturations were monitored continuously. The                            [id number] ([id number]) scope was introduced through the                            mouth, and advanced to the second part of duodenum.                            The upper GI endoscopy was accomplished without                            difficulty. The patient tolerated the procedure                            well. Scope In: 9:21:41 AM Scope Out: 9:27:30 AM Total Procedure Duration: 0 hours 5 minutes 49 seconds  Findings:      The examined esophagus was normal.      The Z-line was irregular and was found 35 cm from the incisors.      Patchy moderately erythematous mucosa without bleeding was found in the       gastric antrum.      A 3 mm healed ulcer was found on the posterior wall of the gastric       antrum. Adjacent mucosal findings include erythema.      The exam of the stomach was otherwise normal.      The duodenal bulb and second portion of the duodenum were normal.       Estimated blood loss: none. Estimated blood loss: none. Impression:               - Normal esophagus.                           - Z-line irregular, 35  cm from the incisors.                           - Erythematous mucosa in the antrum.                           - Scar in the gastric antrum (posterior                            wall)consistent with healed ulcer.                           - Normal duodenal bulb and second portion of the                            duodenum.                           - No specimens collected. Moderate Sedation:      Moderate (conscious) sedation was administered by the endoscopy nurse       and supervised by the endoscopist. The following parameters were       monitored: oxygen saturation, heart rate, blood pressure, CO2       capnography and response to care. Total physician  intraservice time was       10 minutes. Recommendation:           - Patient has a contact number available for                            emergencies. The signs and symptoms of potential                            delayed complications were discussed with the                            patient. Return to normal activities tomorrow.                            Written discharge instructions were provided to the                            patient.                           - Resume previous diet today.                           - Continue present medicationsstop ferrous sulfate                            and alendronate until office visit.                           - Resume Eliquis (apixaban) at prior dose today. Procedure Code(s):        --- Professional ---  16109, Esophagogastroduodenoscopy, flexible,                            transoral; diagnostic, including collection of                            specimen(s) by brushing or washing, when performed                            (separate procedure)                           99152, Moderate sedation services provided by the                            same physician or other qualified health care                            professional performing the diagnostic or                            therapeutic service that the sedation supports,                            requiring the presence of an independent trained                            observer to assist in the monitoring of the                            patient's level of consciousness and physiological                            status; initial 15 minutes of intraservice time,                            patient age 80 years or older Diagnosis Code(s):        --- Professional ---                           K22.8, Other specified diseases of esophagus                           K31.89, Other diseases of stomach and duodenum                           K21.9,  Gastro-esophageal reflux disease without                            esophagitis                           R11.0, Nausea CPT copyright 2016 [employer]. All rights reserved. The codes documented in this report are preliminary and upon coder review may  be revised to meet current compliance requirements. Lionel December, MD Lionel December, MD  02/14/2016 9:48:06 AM This report has been signed electronically. Number of Addenda: 0

## 2016-02-14 NOTE — H&P (Signed)
Ana RouxMarie W Pena is an 82 y.o. female.   Chief Complaint: Patient is here for diagnostic EGD. HPI: Patient is 82 year old Caucasian female with multiple medical problems including chronic GERD who presents with over 2 month history of nausea. She also has frequent regurgitation of "boiling water". She has heartburn for more than 2-3 times a week. She says medication is not helping. She denies vomiting or dysphagia. She does not have a good appetite and has not lost any weight. She says nausea generally gets better towards end of the day. She denies rectal bleeding. She says her stools are black but she is on iron. She complains of intermittent upper and mid abdominal pain. She has been on Fosamax for few years. She does not take OTC NSAIDs. She had cholecystectomy when she was in her 5640s. Last dose of Eliquis was yesterday.  Past Medical History  Diagnosis Date  . Hypertension   . Hypothyroidism   . GERD (gastroesophageal reflux disease)   . H/O hiatal hernia   . Diverticulosis   . CAD (coronary artery disease)     a. s/p PTCA/DES to LAD 07/23/12. b. Mildly elevated trop 04/2013 with stable cath, no culprit.  . Diastolic CHF (HCC)     a. Prior normal EF 06/2013. b. EF 40-45% by echo 04/2013.  [name]. Abnormal CT scan     a. Question of possible renal cyst in RUQ [facility] CT [facility]. b. Redemonstrated 04/2013, pt instructed to f/u PCP.  [name]. Hypercholesteremia   . Asthma   . Iron deficiency anemia   . Arthritis     "all over" (05/11/2013)  . Chronic lower back pain   . LBBB (left bundle branch block)   . CKD (chronic kidney disease)   . Chronic venous insufficiency     Past Surgical History  Procedure Laterality Date  . Abdominal hysterectomy    . Total knee arthroplasty Right 2000  . Cholecystectomy    . Breast cyst excision Right 1970's  . Coronary angioplasty with stent placement  06/2012    DES proximal LAD [name]/notes 05/11/2013  . Cardiac catheterization  05/11/2013  . Carpal tunnel  release Right   . Cataract extraction w/ intraocular lens  implant, bilateral    . Bunionectomy Bilateral   . Hammer toe surgery Bilateral   . Toe surgery      "don't really remember which toe or which side; it was real crooked and had to be straightened" (05/11/2013)  . Left heart catheterization with coronary angiogram N/A 07/23/2012    Procedure: LEFT HEART CATHETERIZATION WITH CORONARY ANGIOGRAM;  Surgeon: Kathleene Hazelhristopher D McAlhany, MD;  [facility];  Service: Cardiovascular;  Laterality: N/A;  . Percutaneous coronary stent intervention (pci-s)  07/23/2012    Procedure: PERCUTANEOUS CORONARY STENT INTERVENTION (PCI-S);  Surgeon: Kathleene Hazelhristopher D McAlhany, MD;  [facility];  Service: Cardiovascular;;  . Left heart catheterization with coronary angiogram N/A 05/11/2013    Procedure: LEFT HEART CATHETERIZATION WITH CORONARY ANGIOGRAM;  Surgeon: Kathleene Hazelhristopher D McAlhany, MD;  [facility];  Service: Cardiovascular;  Laterality: N/A;    Family History  Problem Relation Age of Onset  . Breast cancer Mother   . Heart disease Father    Social History:  reports that she has never smoked. She has never used smokeless tobacco. She reports that she does not drink alcohol or use illicit drugs.  Allergies:  Allergies  Allergen Reactions  . Morphine And Related Nausea Only  . Sulfa Antibiotics  unknown  . Sulfa Drugs Cross Reactors     unknown    Medications Prior to Admission  Medication Sig Dispense Refill  . amiodarone (PACERONE) 200 MG tablet Take 1 tablet (200 mg total) by mouth daily. 30 tablet 3  . amLODipine (NORVASC) 5 MG tablet Take 1 tablet (5 mg total) by mouth daily. 90 tablet 3  . apixaban (ELIQUIS) 2.5 MG TABS tablet Take 1 tablet (2.5 mg total) by mouth 2 (two) times daily. 60 tablet 3  . atorvastatin (LIPITOR) 20 MG tablet Take 20 mg by mouth every evening.    . benazepril (LOTENSIN) 20 MG tablet Take 20 mg by mouth daily.     . carvedilol (COREG) 3.125  MG tablet Take 1 tablet (3.125 mg total) by mouth 2 (two) times daily. 60 tablet 6  . Cholecalciferol (VITAMIN D-3) 1000 UNITS CAPS Take 5,000 Units by mouth daily.     [name] dexlansoprazole (DEXILANT) 60 MG capsule Take 60 mg by mouth daily.    . ferrous sulfate 325 (65 FE) MG tablet Take 325 mg by mouth daily with breakfast.    . levothyroxine (SYNTHROID, LEVOTHROID) 75 MCG tablet Take 75 mcg by mouth daily.    . Magnesium Oxide 200 MG TABS Take 200 mg by mouth 2 (two) times daily.    [name] oxyCODONE-acetaminophen (PERCOCET/ROXICET) 5-325 MG per tablet Take 1 tablet by mouth 2 (two) times daily as needed.     . pregabalin (LYRICA) 50 MG capsule Take 50 mg by mouth 2 (two) times daily.    . ranitidine (ZANTAC) 300 MG tablet Take 1 tablet by mouth daily.    [name] alendronate (FOSAMAX) 70 MG tablet Take 70 mg by mouth every 7 (seven) days. Take with a full glass of water on an empty stomach.    . diazepam (VALIUM) 2 MG tablet Take 1 tablet by mouth 3 (three) times daily as needed.    . nitroGLYCERIN (NITROSTAT) 0.4 MG SL tablet Place 0.4 mg under the tongue every 5 (five) minutes as needed for chest pain (up to 3 doses).    . psyllium (REGULOID) 0.52 G capsule Take 0.52 g by mouth 2 (two) times daily.     [name] torsemide (DEMADEX) 20 MG tablet Take 1 tablet (20 mg total) by mouth daily. As needed for swelling 90 tablet 3    No results found for this or any previous visit (from the past 48 hour(s)). No results found.  ROS  Blood pressure 195/92, pulse 65, temperature 97.8 F (36.6 C), temperature source Oral, resp. rate 14, SpO2 95 %. Physical Exam  Constitutional: She is oriented to person, place, and time. She appears well-developed and well-nourished.  HENT:  Mouth/Throat: Oropharynx is clear and moist.  Eyes: Conjunctivae are normal. No scleral icterus.  Cardiovascular: Normal rate, regular rhythm and normal heart sounds.   No murmur heard. Respiratory: Effort normal and breath sounds normal.  GI:   Abdomen is symmetrical. On palpation is soft with mild periumbilical tenderness. No organomegaly or masses.  Musculoskeletal: She exhibits no edema.  Lymphadenopathy:    She has no cervical adenopathy.  Neurological: She is alert and oriented to person, place, and time.  Skin:  Skin lower legs is very thin.     Assessment/Plan Recurrent nausea and abdominal pain. Chronic GERD. Diagnostic EGD. Please note patient remains on Eliquis therefore biopsies will not be obtained.  Malissa Hippo, MD 02/14/2016, 9:10 AM

## 2016-02-14 NOTE — Discharge Instructions (Signed)
Stop alendronate and ferrous sulfate for now. Ondansetron 4 mg by mouth twice a day as needed. Resume other medications and diet as before. No driving for 24 hours. Physician will call with results of blood test. Office visit in 4 weeks with Dr. Karilyn Cotaehman   Gastrointestinal Endoscopy, Care After Refer to this sheet in the next few weeks. These instructions provide you with information on caring for yourself after your procedure. Your caregiver may also give you more specific instructions. Your treatment has been planned according to current medical practices, but problems sometimes occur. Call your caregiver if you have any problems or questions after your procedure. HOME CARE INSTRUCTIONS  If you were given medicine to help you relax (sedative), do not drive, operate machinery, or sign important documents for 24 hours.  Avoid alcohol and hot or warm beverages for the first 24 hours after the procedure.  Only take over-the-counter or prescription medicines for pain, discomfort, or fever as directed by your caregiver. You may resume taking your normal medicines unless your caregiver tells you otherwise. Ask your caregiver when you may resume taking medicines that may cause bleeding, such as aspirin, clopidogrel, or warfarin.  You may return to your normal diet and activities on the day after your procedure, or as directed by your caregiver. Walking may help to reduce any bloated feeling in your abdomen.  Drink enough fluids to keep your urine clear or pale yellow.  You may gargle with salt water if you have a sore throat. SEEK IMMEDIATE MEDICAL CARE IF:  You have severe nausea or vomiting.  You have severe abdominal pain, abdominal cramps that last longer than 6 hours, or abdominal swelling (distention).  You have severe shoulder or back pain.  You have trouble swallowing.  You have shortness of breath, your breathing is shallow, or you are breathing faster than normal.  You have a  fever or a rapid heartbeat.  You vomit blood or material that looks like coffee grounds.  You have bloody, black, or tarry stools. MAKE SURE YOU:  Understand these instructions.  Will watch your condition.  Will get help right away if you are not doing well or get worse.   This information is not intended to replace advice given to you by your health care provider. Make sure you discuss any questions you have with your health care provider.   Document Released: 06/25/2004 Document Revised: 12/02/2014 Document Reviewed: 02/11/2012 Elsevier Interactive Patient Education [employer]2016 [employer]

## 2016-02-15 ENCOUNTER — Telehealth: Payer: Self-pay | Admitting: Cardiology

## 2016-02-15 LAB — H. PYLORI ANTIBODY, IGG: H Pylori IgG: 1.1 U/mL — ABNORMAL HIGH (ref 0.0–0.8)

## 2016-02-15 NOTE — Telephone Encounter (Signed)
Zofran is ok, the notice came up because she is on amiodarone but I'm ok using zofran while on it  J BrancH MD

## 2016-02-15 NOTE — Telephone Encounter (Signed)
Mrs. Ana Pena was seen by Dr. Karilyn Cotaehman on 02/14/16 . Was given RX for Zofran 4 mg. Went to pharmacy to pick up and was told that she needs to contact Dr. Wyline MoodBranch To see if this medication will interfere with her heart medications.

## 2016-02-15 NOTE — Telephone Encounter (Signed)
Bjorn LoserRhonda (daughter) notified.

## 2016-02-19 ENCOUNTER — Encounter (HOSPITAL_COMMUNITY): Payer: Self-pay | Admitting: Internal Medicine

## 2016-02-27 ENCOUNTER — Other Ambulatory Visit (INDEPENDENT_AMBULATORY_CARE_PROVIDER_SITE_OTHER): Payer: Self-pay | Admitting: Internal Medicine

## 2016-02-27 MED ORDER — BIS SUBCIT-METRONID-TETRACYC 140-125-125 MG PO CAPS: 3.0000 | ORAL_CAPSULE | Freq: Three times a day (TID) | ORAL | Status: DC

## 2016-03-11 ENCOUNTER — Telehealth: Payer: Self-pay | Admitting: *Deleted

## 2016-03-11 NOTE — Telephone Encounter (Signed)
-----   Message from Antoine PocheJonathan F Branch, MD sent at 03/07/2016  1:42 PM EDT ----- BP log reviewed, looks like bp is trending down since staring norvasc. Would continue current meds   Dominga FerryJ Branch MD

## 2016-03-11 NOTE — Telephone Encounter (Signed)
Pt aware.

## 2016-03-14 ENCOUNTER — Ambulatory Visit (INDEPENDENT_AMBULATORY_CARE_PROVIDER_SITE_OTHER): Payer: Medicare Other | Admitting: Internal Medicine

## 2016-03-14 ENCOUNTER — Encounter (INDEPENDENT_AMBULATORY_CARE_PROVIDER_SITE_OTHER): Payer: Self-pay | Admitting: Internal Medicine

## 2016-03-14 VITALS — BP 130/80 | HR 67 | Temp 98.7°F | Resp 18 | Ht 60.0 in | Wt 155.7 lb

## 2016-03-14 DIAGNOSIS — I2510 Atherosclerotic heart disease of native coronary artery without angina pectoris: Secondary | ICD-10-CM

## 2016-03-14 DIAGNOSIS — R109 Unspecified abdominal pain: Secondary | ICD-10-CM

## 2016-03-14 DIAGNOSIS — B9681 Helicobacter pylori [H. pylori] as the cause of diseases classified elsewhere: Secondary | ICD-10-CM | POA: Diagnosis not present

## 2016-03-14 DIAGNOSIS — R110 Nausea: Secondary | ICD-10-CM | POA: Diagnosis not present

## 2016-03-14 DIAGNOSIS — K2970 Gastritis, unspecified, without bleeding: Secondary | ICD-10-CM

## 2016-03-14 DIAGNOSIS — K219 Gastro-esophageal reflux disease without esophagitis: Secondary | ICD-10-CM

## 2016-03-14 NOTE — Progress Notes (Signed)
Presenting complaint;  Follow-up for nausea GERD and H. pylori infection.   Subjective:  Patient is 82 year old Caucasian female who was evaluated last month for nausea and poorly controlled GERD symptoms. She underwent EGD on 02/14/2016 revealing antral gastritis along with the scar. H. pylori serology was positive. She was not able to fill Pylera prescription because cost was nearly thousand dollars. She states heartburn is well controlled with therapy. She has nausea virtually every day but it is not as bad. She takes ondansetron usually at night. She also complains of sharp upper and mid abdominal pain usually when she wakes up. She does not have pain rest of the day. Her appetite is normal. She is not sure how she has gained 5 pounds. She denies melena or rectal bleeding.    Current Medications: Outpatient Encounter Prescriptions as of 03/14/2016  Medication Sig  . amiodarone (PACERONE) 200 MG tablet Take 1 tablet (200 mg total) by mouth daily.  [name] amLODipine (NORVASC) 5 MG tablet Take 1 tablet (5 mg total) by mouth daily.  [name] apixaban (ELIQUIS) 2.5 MG TABS tablet Take 1 tablet (2.5 mg total) by mouth 2 (two) times daily.  [name] atorvastatin (LIPITOR) 20 MG tablet Take 20 mg by mouth every evening.  . benazepril (LOTENSIN) 20 MG tablet Take 20 mg by mouth daily.   . Cholecalciferol (VITAMIN D-3) 1000 UNITS CAPS Take 5,000 Units by mouth daily.   [name] dexlansoprazole (DEXILANT) 60 MG capsule Take 60 mg by mouth daily.  . diazepam (VALIUM) 2 MG tablet Take 1 tablet by mouth 3 (three) times daily as needed.  [name] levothyroxine (SYNTHROID, LEVOTHROID) 75 MCG tablet Take 75 mcg by mouth daily.  . Magnesium Oxide 200 MG TABS Take 200 mg by mouth 2 (two) times daily.  . nitroGLYCERIN (NITROSTAT) 0.4 MG SL tablet Place 0.4 mg under the tongue every 5 (five) minutes as needed for chest pain (up to 3 doses).  . ondansetron (ZOFRAN) 4 MG tablet Take 1 tablet (4 mg total) by mouth 2 (two) times daily as needed  for nausea or vomiting.  [name] oxyCODONE-acetaminophen (PERCOCET/ROXICET) 5-325 MG per tablet Take 1 tablet by mouth 2 (two) times daily as needed.   . pregabalin (LYRICA) 50 MG capsule Take 50 mg by mouth 2 (two) times daily.  . psyllium (REGULOID) 0.52 G capsule Take 0.52 g by mouth 2 (two) times daily.   . ranitidine (ZANTAC) 300 MG tablet Take 1 tablet by mouth daily.  [name] torsemide (DEMADEX) 20 MG tablet Take 1 tablet (20 mg total) by mouth daily. As needed for swelling  . bismuth-metronidazole-tetracycline (PYLERA) 140-125-125 MG capsule Take 3 capsules by mouth 4 (four) times daily -  before meals and at bedtime. (Patient not taking: Reported on 03/14/2016)  . [DISCONTINUED] carvedilol (COREG) 3.125 MG tablet Take 1 tablet (3.125 mg total) by mouth 2 (two) times daily. (Patient not taking: Reported on 03/14/2016)   No facility-administered encounter medications on file as of 03/14/2016.     Objective: Blood pressure 130/80, pulse 67, temperature 98.7 F (37.1 C), temperature source Oral, resp. rate 18, height 5' (1.524 m), weight 155 lb 11.2 oz (70.625 kg). Patient is alert and in no acute distress. She has mild hearing impairment. Conjunctiva is pink. Sclera is nonicteric Oropharyngeal mucosa is normal. No neck masses or thyromegaly noted. Cardiac exam with regular rhythm normal S1 and S2. No murmur or gallop noted. Lungs are clear to auscultation. Abdomen is full. Bowel sounds are normal. No bruit noted. Abdomen is  soft with mild generalized tenderness which is superficial. No organomegaly or masses.  No LE edema or clubbing noted.  Labs/studies Results:   H. pylori serology positive.  Assessment:  #1. Nausea. Suspect nauseous secondary to one for medications. I do not believe nauseous of GI origin. Most likely culprits would be narcotic amiodarone or later cut. #2. GERD. Heartburn is well controlled with therapy. While on PPI she does not need to takes Zantac every morning. #3. H.  pylori gastritis. Recent EGD also revealed antral scar. Therefore treatment would be appropriate if tolerated. She did not fill prescription because it cost was $998. Have requested samples for her. #4. Abdominal pain appears to be wall pain.   Plan:  Patient advised to take pain medication with food or snack. Take ranitidine only at bedtime on as-needed basis for breakthrough heartburn. Continue Dexilant at 60 mg by mouth every morning. Try ondansetron 4 mg every morning to see if nausea can be prevented. Check with Dr. Wyline MoodBranch if amiodarone dose could be reduced. Will treat H. pylori gastritis with Pylera 3 capsules by mouth 4 times a day for 10 days when samples secured. Office visit in 3 months.

## 2016-03-14 NOTE — Patient Instructions (Signed)
Office will contact you when we have samples of Pylera.

## 2016-03-15 ENCOUNTER — Encounter (INDEPENDENT_AMBULATORY_CARE_PROVIDER_SITE_OTHER): Payer: Self-pay | Admitting: Internal Medicine

## 2016-05-09 ENCOUNTER — Other Ambulatory Visit: Payer: Self-pay | Admitting: Cardiology

## 2016-05-09 ENCOUNTER — Encounter: Payer: Self-pay | Admitting: *Deleted

## 2016-05-09 ENCOUNTER — Encounter: Payer: Self-pay | Admitting: Cardiology

## 2016-05-09 ENCOUNTER — Ambulatory Visit (INDEPENDENT_AMBULATORY_CARE_PROVIDER_SITE_OTHER): Payer: Medicare Other | Admitting: Cardiology

## 2016-05-09 ENCOUNTER — Ambulatory Visit (INDEPENDENT_AMBULATORY_CARE_PROVIDER_SITE_OTHER): Payer: Medicare Other | Admitting: *Deleted

## 2016-05-09 VITALS — BP 148/72 | HR 72 | Ht 60.0 in | Wt 152.8 lb

## 2016-05-09 DIAGNOSIS — I5022 Chronic systolic (congestive) heart failure: Secondary | ICD-10-CM | POA: Diagnosis not present

## 2016-05-09 DIAGNOSIS — I4891 Unspecified atrial fibrillation: Secondary | ICD-10-CM | POA: Diagnosis not present

## 2016-05-09 DIAGNOSIS — Z5181 Encounter for therapeutic drug level monitoring: Secondary | ICD-10-CM

## 2016-05-09 DIAGNOSIS — I2510 Atherosclerotic heart disease of native coronary artery without angina pectoris: Secondary | ICD-10-CM

## 2016-05-09 MED ORDER — AMIODARONE HCL 100 MG PO TABS: 100.0000 mg | ORAL_TABLET | Freq: Every day | ORAL | Status: DC

## 2016-05-09 NOTE — Progress Notes (Signed)
Clinical Summary Ana Pena is a 82 y.o.female seen today for follow up of the following medical problems.   1. Afib - 11/16/15 monitor with reviewed including full disclosure report, shows episodes of afib/aflutter with RVR as well as some sinus bradycardia.  - on admiodarone and eliquis - denies any palpitations.  - recent nausea, unclear if amiodarone related.     2. CAD/ICM/Chronic systolic heart failure - history of prior DES to LAD in 06/2012  - cath 04/2013 with patent LAD stent  - LVEF 40-45% by echo 04/2013   - at a prior visit described some DOE with short distances. Notes some chest pain at times. Episode while at [facility]. Sharp pain in midchest, 10/10 in severity. No other symptoms. No positional. Lasted 15 minutes. Occurs once every 3-4 weeks. No relation to food.  - we repeated her echo which showed stable LVEF at 40-45%, grade I diastolic dysfunction.  - Lexiscan MPI 06/2015 showed no ischemia. - since last visit symptoms of fatigue have continued. Has not improved since we stopped her coreg.    3. Chronic fatigue - last visit we stopped coreg. Haven't noticed much for difference.   4. HTN - recently started on norvasc  5. Hypoxia - recent low O2 sats at home, sent to [facility] for evaluation 03/2016.  - admitted to [facility], thought possible related to COPD - started symbicort at discharge, symptoms  have improved.  Past Medical History  Diagnosis Date  . Hypertension   . Hypothyroidism   . GERD (gastroesophageal reflux disease)   . H/O hiatal hernia   . Diverticulosis   . CAD (coronary artery disease)     a. s/p PTCA/DES to LAD 07/23/12. b. Mildly elevated trop 04/2013 with stable cath, no culprit.  . Diastolic CHF (HCC)     a. Prior normal EF 06/2013. b. EF 40-45% by echo 04/2013.  [name] Abnormal CT scan     a. Question of possible renal cyst in RUQ by CT scan 06/2012 at [facility]. b. Redemonstrated 04/2013, pt instructed to f/u PCP.  [name]  Hypercholesteremia   . Asthma   . Iron deficiency anemia   . Arthritis     "all over" (05/11/2013)  . Chronic lower back pain   . LBBB (left bundle Luane Rochon block)   . CKD (chronic kidney disease)   . Chronic venous insufficiency      Allergies  Allergen Reactions  . Morphine And Related Nausea Only  . Sulfa Antibiotics     unknown  . Sulfa Drugs Cross Reactors     unknown     Current Outpatient Prescriptions  Medication Sig Dispense Refill  . amiodarone (PACERONE) 200 MG tablet Take 1 tablet (200 mg total) by mouth daily. 30 tablet 3  . amLODipine (NORVASC) 5 MG tablet Take 1 tablet (5 mg total) by mouth daily. 90 tablet 3  . apixaban (ELIQUIS) 2.5 MG TABS tablet Take 1 tablet (2.5 mg total) by mouth 2 (two) times daily. 60 tablet 3  . atorvastatin (LIPITOR) 20 MG tablet Take 20 mg by mouth every evening.    . benazepril (LOTENSIN) 20 MG tablet Take 20 mg by mouth daily.     [name] bismuth-metronidazole-tetracycline (PYLERA) 140-125-125 MG capsule Take 3 capsules by mouth 4 (four) times daily -  before meals and at bedtime. (Patient not taking: Reported on 03/14/2016) 120 capsule 0  . Cholecalciferol (VITAMIN D-3) 1000 UNITS CAPS Take 5,000 Units by mouth daily.     [name]  dexlansoprazole (DEXILANT) 60 MG capsule Take 60 mg by mouth daily.    . diazepam (VALIUM) 2 MG tablet Take 1 tablet by mouth 3 (three) times daily as needed.    [name] levothyroxine (SYNTHROID, LEVOTHROID) 75 MCG tablet Take 75 mcg by mouth daily.    . Magnesium Oxide 200 MG TABS Take 200 mg by mouth 2 (two) times daily.    . nitroGLYCERIN (NITROSTAT) 0.4 MG SL tablet Place 0.4 mg under the tongue every 5 (five) minutes as needed for chest pain (up to 3 doses).    . ondansetron (ZOFRAN) 4 MG tablet Take 1 tablet (4 mg total) by mouth 2 (two) times daily as needed for nausea or vomiting. 30 tablet 1  . oxyCODONE-acetaminophen (PERCOCET/ROXICET) 5-325 MG per tablet Take 1 tablet by mouth 2 (two) times daily as needed.     .  pregabalin (LYRICA) 50 MG capsule Take 50 mg by mouth 2 (two) times daily.    . psyllium (REGULOID) 0.52 G capsule Take 0.52 g by mouth 2 (two) times daily.     . ranitidine (ZANTAC) 300 MG tablet Take 1 tablet by mouth at bedtime as needed.    . torsemide (DEMADEX) 20 MG tablet Take 1 tablet (20 mg total) by mouth daily. As needed for swelling 90 tablet 3   No current facility-administered medications for this visit.     Past Surgical History  Procedure Laterality Date  . Abdominal hysterectomy    . Total knee arthroplasty Right 2000  . Cholecystectomy    . Breast cyst excision Right 1970's  . Coronary angioplasty with stent placement  06/2012    DES proximal LAD [name] 05/11/2013  . Cardiac catheterization  05/11/2013  . Carpal tunnel release Right   . Cataract extraction w/ intraocular lens  implant, bilateral    . Bunionectomy Bilateral   . Hammer toe surgery Bilateral   . Toe surgery      "don't really remember which toe or which side; it was real crooked and had to be straightened" (05/11/2013)  . Left heart catheterization with coronary angiogram N/A 07/23/2012    Procedure: LEFT HEART CATHETERIZATION WITH CORONARY ANGIOGRAM;  Surgeon: Kathleene Hazel, MD;  Location: [facility] CATH LAB;  Service: Cardiovascular;  Laterality: N/A;  . Percutaneous coronary stent intervention (pci-s)  07/23/2012    Procedure: PERCUTANEOUS CORONARY STENT INTERVENTION (PCI-S);  Surgeon: Kathleene Hazel, MD;  Location: [facility] CATH LAB;  Service: Cardiovascular;;  . Left heart catheterization with coronary angiogram N/A 05/11/2013    Procedure: LEFT HEART CATHETERIZATION WITH CORONARY ANGIOGRAM;  Surgeon: Kathleene Hazel, MD;  Location: [facility] CATH LAB;  Service: Cardiovascular;  Laterality: N/A;  . Esophagogastroduodenoscopy N/A 02/14/2016    Procedure: ESOPHAGOGASTRODUODENOSCOPY (EGD);  Surgeon: Malissa Hippo, MD;  Location: [facility] ENDO SUITE;  Service: Endoscopy;  Laterality: N/A;  1:40      Allergies  Allergen Reactions  . Morphine And Related Nausea Only  . Sulfa Antibiotics     unknown  . Sulfa Drugs Cross Reactors     unknown      Family History  Problem Relation Age of Onset  . Breast cancer Mother   . Heart disease Father      Social History Ms. Betley reports that she has never smoked. She has never used smokeless tobacco. Ms. Eldred reports that she does not drink alcohol.   Review of Systems CONSTITUTIONAL: No weight loss, fever, chills, weakness or fatigue.  HEENT: Eyes: No visual loss, blurred vision, double  vision or yellow sclerae.No hearing loss, sneezing, congestion, runny nose or sore throat.  SKIN: No rash or itching.  CARDIOVASCULAR: per HPI RESPIRATORY: No shortness of breath, cough or sputum.  GASTROINTESTINAL: No anorexia, nausea, vomiting or diarrhea. No abdominal pain or blood.  GENITOURINARY: No burning on urination, no polyuria NEUROLOGICAL: No headache, dizziness, syncope, paralysis, ataxia, numbness or tingling in the extremities. No change in bowel or bladder control.  MUSCULOSKELETAL: No muscle, back pain, joint pain or stiffness.  LYMPHATICS: No enlarged nodes. No history of splenectomy.  PSYCHIATRIC: No history of depression or anxiety.  ENDOCRINOLOGIC: No reports of sweating, cold or heat intolerance. No polyuria or polydipsia.  [name].   Physical Examination Filed Vitals:   05/09/16 1359  BP: 148/72  Pulse: 72   Filed Vitals:   05/09/16 1359  Height: 5' (1.524 m)  Weight: 152 lb 12.8 oz (69.31 kg)    Gen: resting comfortably, no acute distress HEENT: no scleral icterus, pupils equal round and reactive, no palptable cervical adenopathy,  CV: RRR, no m/r/g, no jvd Resp: Clear to auscultation bilaterally GI: abdomen is soft, non-tender, non-distended, normal bowel sounds, no hepatosplenomegaly MSK: extremities are warm, no edema.  Skin: warm, no rash Neuro:  no focal deficits Psych: appropriate  affect   Diagnostic Studies 04/2013 Cath  Hemodynamic Findings:  Central aortic pressure: 161/60  Left ventricular pressure: 159/8/13  Angiographic Findings:  Left main: No obstructive disease.  Left Anterior Descending Artery: Large caliber vessel that courses to the apex. The proximal vessel has 20% stenosis. The mid vessel has a patent stented segment with no restenosis noted. The diagonal is patent. The mid to distal vessel has a focal 50% smooth stenosis which does not appear to be flow limiting, unchanged in appearance from cath in 2013.  Circumflex Artery: Moderate sized vessel with proximal plaque, 40% smooth stenosis in the mid vessel. The first OM is small and patent. The AV groove Circumflex terminates into a moderate caliber second OM Kambree Krauss.  Right Coronary Artery: Large, dominant vessel with 30% mid stenosis.  Left Ventricular Angiogram: Deferred.  Impression:  1. Stable single vessel CAD with patent stent proximal to mid LAD  2. Mild to moderate non-obstructive disease in the distal LAD, distal Circumflex and mid RCA  Recommendations: Continue medical management. Her d-dimer is slightly elevated but [facility]. Will monitor overnight.   04/2013 Echo  LVEF 40-45%, grade I diastolic dysfunction,   06/2015 echo Study Conclusions  - Left ventricle: The cavity size was normal. There was mild concentric hypertrophy. Systolic function was mildly to moderately reduced. The estimated ejection fraction was in the range of 40% to 45%. Diffuse hypokinesis. Doppler parameters are consistent with abnormal left ventricular relaxation (grade 1 diastolic dysfunction). Elevated filling pressures. - Ventricular septum: Septal motion showed abnormal function and dyssynergy. These changes are consistent with intraventricular conduction delay. - Aortic valve: There was trivial regurgitation. - Mitral valve: Mildly thickened  leaflets . There was mild regurgitation. - Left atrium: The atrium was mildly dilated. - Tricuspid valve: There was mild regurgitation. - Pulmonary arteries: PA peak pressure: 38 mm Hg (S). Mildly elevated pulmonary pressures.  06/2015 Lexiscan MPI  LBBB present throughout study.  There is a small defect of mild severity present in the mid anteroseptal, apical anterior and apical septal location. The defect is non-reversible. Consistent with scar versus soft tissue attenuation. No definite ischemia.  This is a low risk study.  Nuclear stress EF: 53%. Mild  global hypokinesis.        Assessment and Plan  1. Afib -no current symptoms - we will decrase amio to  daily to see if helps with her nausea - continue eliquis for stroke prevention, CHADS2Vasc is 6.    2. CAD/ICM/Chronic systolic HF - appears euvolemic - due to fatigue we will continue to hold coreg at this time.    3. HTN - reasonable control given her age, continue current meds   4. SOB - improved with symbicort. Request records from recent hospital admission.     F/u 2 moonths   Antoine Poche, M.D.

## 2016-05-09 NOTE — Patient Instructions (Signed)
Your physician recommends that you schedule a follow-up appointment in: 2 MONTHS WITH DR. BRANCH  Your physician has recommended you make the following change in your medication:   DECREASE AMIODARONE 100 MG DAILY  Thank you for choosing [facility]!!

## 2016-05-09 NOTE — Progress Notes (Signed)
Pt was started on Eliquis 11/30/15 by Dr Wyline MoodBranch for atrial fib/flutter.   Pt denies problems with Eliquis since last visit. Denies any increased bruising, bleeding or GI upset.  Reviewed patients medication list. Pt is not currently on any combined P-gp and strong CYP3A4 inhibitors/inducers (ketoconazole, traconazole, ritonavir, carbamazepine, phenytoin, rifampin, St. John's wort). [facility]17 during admission. SCr 2.1, Weight 71.9kg, CrCl 26.17. Dose is appropriate based on 2 out of 3 criteria (age & SCr). Hgb and HCT: 9.6/30.3  A full discussion of the nature of anticoagulants has been carried out. A benefit/risk analysis has been presented to the patient, so that they understand the justification for choosing anticoagulation with Eliquis at this time. The need for compliance is stressed. Pt is aware to take the medication twice daily. Side effects of potential bleeding are discussed, including unusual colored urine or stools, coughing up blood or coffee ground emesis, nose bleeds or serious fall or head trauma. Discussed signs and symptoms of stroke. The patient should avoid any OTC items containing aspirin or ibuprofen. Avoid alcohol consumption. Call if any signs of abnormal bleeding. Discussed financial obligations and resolved any difficulty in obtaining medication. BMP and CBC reordered today after [facility]  During admission.  Pt states she was dehydrated.  HGB was low and SrCr was elevated more since last check.  Dr Wyline MoodBranch aware.    Reviewed labs from May and discussed with patient. Due to elevated kidney function and age pt will remain on Eliquis to 2.5mg  BID and f/u in 3 months. She verbalized understanding. Appt made for 08/15/16.  BMP and CBC reordered today after [facility]  During admission.  Pt states she was dehydrated.  HGB was low and SrCr was elevated more since last check.  Dr Wyline MoodBranch aware.    Repeat labs 05/09/16:   SrCr 1.79,  CrCl 25.13,  Hgb 12.2  Hct 38.1     Call daughter with results.

## 2016-05-10 LAB — BASIC METABOLIC PANEL WITH GFR
BUN/Creatinine Ratio: 15 (ref 12–28)
BUN: 26 mg/dL (ref 8–27)
CO2: 23 mmol/L (ref 18–29)
Calcium: 9.2 mg/dL (ref 8.7–10.3)
Chloride: 100 mmol/L (ref 96–106)
Creatinine, Ser: 1.79 mg/dL — ABNORMAL HIGH (ref 0.57–1.00)
GFR calc Af Amer: 29 mL/min/1.73 — ABNORMAL LOW
GFR calc non Af Amer: 25 mL/min/1.73 — ABNORMAL LOW
Glucose: 92 mg/dL (ref 65–99)
Potassium: 5.3 mmol/L — ABNORMAL HIGH (ref 3.5–5.2)
Sodium: 141 mmol/L (ref 134–144)

## 2016-05-10 LAB — CBC
Hematocrit: 38.1 % (ref 34.0–46.6)
Hemoglobin: 12.2 g/dL (ref 11.1–15.9)
MCH: 28.9 pg (ref 26.6–33.0)
MCHC: 32 g/dL (ref 31.5–35.7)
MCV: 90 fL (ref 79–97)
PLATELETS: 198 10*3/uL (ref 150–379)
RBC: 4.22 x10E6/uL (ref 3.77–5.28)
RDW: 14.7 % (ref 12.3–15.4)
WBC: 5.6 10*3/uL (ref 3.4–10.8)

## 2016-05-15 ENCOUNTER — Telehealth: Payer: Self-pay | Admitting: *Deleted

## 2016-05-15 DIAGNOSIS — I10 Essential (primary) hypertension: Secondary | ICD-10-CM

## 2016-05-15 NOTE — Telephone Encounter (Signed)
-----   Message from Antoine PocheJonathan F Branch, MD sent at 05/10/2016  1:46 PM EDT ----- Labs show kidney function remains decreased by mildly improved from when she was in the hospital. Potassium is just mildly elevated and we will need to monitor closely. Repeat BMET and Mg in 2 week   JBranch MD

## 2016-05-15 NOTE — Telephone Encounter (Signed)
Pt daughter aware and will mail lab orders to pt home as requested

## 2016-05-31 ENCOUNTER — Telehealth: Payer: Self-pay | Admitting: *Deleted

## 2016-05-31 DIAGNOSIS — I10 Essential (primary) hypertension: Secondary | ICD-10-CM

## 2016-05-31 MED ORDER — CARVEDILOL 3.125 MG PO TABS: 3.1250 mg | ORAL_TABLET | Freq: Two times a day (BID) | ORAL | Status: DC

## 2016-05-31 NOTE — Telephone Encounter (Signed)
Result Note                                 Daughter Bjorn LoserRhonda verbalized understanding. Says pt takes torsemide 1-3 times weekly. Will send coreg to pharmacy and mail pt lab orders. Scheduled 8/3 f/u

## 2016-05-31 NOTE — Telephone Encounter (Signed)
-----   Message from Antoine PocheJonathan F Branch, MD sent at 05/31/2016 12:48 PM EDT ----- Potassium contniues to trend up, can she stop her benazepril as this may be contributing. Her kidney function is mildly decreased from last check, how often is she taking her diuretic. I would restart her coreg 3.125mg  bid since we are stopping her benazepril. Can she get an appt with me in earlier August. Repeat BMET and Mg in 2 weeks, need to contiue to keep a close eye on potassium and kidney function  Dominga FerryJ Branch MD

## 2016-06-18 ENCOUNTER — Encounter (INDEPENDENT_AMBULATORY_CARE_PROVIDER_SITE_OTHER): Payer: Self-pay | Admitting: Internal Medicine

## 2016-06-18 ENCOUNTER — Ambulatory Visit (INDEPENDENT_AMBULATORY_CARE_PROVIDER_SITE_OTHER): Payer: Medicare Other | Admitting: Internal Medicine

## 2016-06-18 VITALS — BP 118/68 | HR 64 | Temp 97.9°F | Ht 60.0 in | Wt 154.4 lb

## 2016-06-18 DIAGNOSIS — R1013 Epigastric pain: Secondary | ICD-10-CM

## 2016-06-18 DIAGNOSIS — I2510 Atherosclerotic heart disease of native coronary artery without angina pectoris: Secondary | ICD-10-CM

## 2016-06-18 DIAGNOSIS — R110 Nausea: Secondary | ICD-10-CM | POA: Diagnosis not present

## 2016-06-18 DIAGNOSIS — K219 Gastro-esophageal reflux disease without esophagitis: Secondary | ICD-10-CM | POA: Diagnosis not present

## 2016-06-18 MED ORDER — SIMETHICONE 180 MG PO CAPS: 1.0000 | ORAL_CAPSULE | Freq: Three times a day (TID) | ORAL | 0 refills | Status: DC | PRN

## 2016-06-18 NOTE — Patient Instructions (Signed)
Please call office with progress report in one month. 

## 2016-06-18 NOTE — Progress Notes (Signed)
Presenting complaint;  Follow-up for nausea GERD and epigastric pain.  Database and Subjective:  Patient is 82 year old Caucasian female who is here for scheduled visit. She was last seen in March 26 17 for nausea GERD with poorly controlled symptoms as well as epigastric pain. She underwent EGD on 02/14/2016 revealing small scar at gastric antrum consistent with healed ulcer. H. pylori serology was positive and she was treated with 10 days of Pylera. She is on Dexlansoprazole. She says heartburns well controlled but she still having spells of nausea without vomiting. She is using ondansetron at least 3 times a week. She also complains of excessive flatulence and epigastric pain. She believes nausea has decreased since amiodarone dose reduced in April 2017 by Dr. Wyline Mood. Her bowels move daily. Every now and then she's Ms. skip a day. She states she has not had a good appetite since she lost taste sensation year ago when she had stroke. She fell back in April and was hospitalized for pelvic fracture. A few days later she was briefly hospitalized for low O2 sat. She is on pain medication and later cuff for neck and shoulder pain.    Current Medications: Outpatient Encounter Prescriptions as of 06/18/2016  Medication Sig  . amiodarone (PACERONE) 100 MG tablet Take 1 tablet (100 mg total) by mouth daily.  [name] amLODipine (NORVASC) 10 MG tablet Take 10 mg by mouth daily.  [name] atorvastatin (LIPITOR) 20 MG tablet Take 20 mg by mouth every evening.  . budesonide-formoterol (SYMBICORT) 80-4.5 MCG/ACT inhaler Inhale 2 puffs into the lungs 2 (two) times daily.  . carvedilol (COREG) 3.125 MG tablet Take 1 tablet (3.125 mg total) by mouth 2 (two) times daily.  . Cholecalciferol (VITAMIN D-3) 1000 UNITS CAPS Take 5,000 Units by mouth daily.   [name] dexlansoprazole (DEXILANT) 60 MG capsule Take 60 mg by mouth daily.  [name] ELIQUIS 2.5 MG TABS tablet TAKE ONE TABLET BY MOUTH TWICE A DAY  . levothyroxine (SYNTHROID,  LEVOTHROID) 75 MCG tablet Take 75 mcg by mouth daily.  . Magnesium Oxide 200 MG TABS Take 200 mg by mouth 2 (two) times daily.  . nitroGLYCERIN (NITROSTAT) 0.4 MG SL tablet Place 0.4 mg under the tongue every 5 (five) minutes as needed for chest pain (up to 3 doses).  . ondansetron (ZOFRAN) 4 MG tablet Take 1 tablet (4 mg total) by mouth 2 (two) times daily as needed for nausea or vomiting.  [name] oxyCODONE-acetaminophen (PERCOCET/ROXICET) 5-325 MG per tablet Take 1 tablet by mouth 2 (two) times daily as needed.   . pregabalin (LYRICA) 50 MG capsule Take 50 mg by mouth 2 (two) times daily.  . psyllium (REGULOID) 0.52 G capsule Take 0.52 g by mouth 2 (two) times daily.   [name] torsemide (DEMADEX) 20 MG tablet Take 1 tablet (20 mg total) by mouth daily. As needed for swelling   No facility-administered encounter medications on file as of 06/18/2016.      Objective: Blood pressure 118/68, pulse 64, temperature 97.9 F (36.6 C), temperature source Oral, height 5' (1.524 m), weight 154 lb 6.4 oz (70 kg). Patient is alert and in no acute distress. Conjunctiva is pink. Sclera is nonicteric Oropharyngeal mucosa is normal. No neck masses or thyromegaly noted. Cardiac exam with regular rhythm normal S1 and S2. No murmur or gallop noted. Lungs are clear to auscultation. Abdomen is symmetrical. Bowel sounds are normal. No bruits noted. On palpation abdomen is soft with mild midepigastric tenderness. No organomegaly or masses. She 1+ pitting edema involving  both legs.   Assessment:  #1. GERD. She is doing better with therapy. #2. Nausea. Nausea appears to be multifactorial. I believe it is primarily secondary to medications. Nausea seemed to have decreased with amiodarone dose reduction. #3. Epigastric pain. His pain is occurring less frequently and not as intense and she was treated for H. pylori infection but it has not gone away completely. It may be due to flatulence.   Plan:  Phazyme OTC 180 mg by  mouth 3 times a day when necessary. Patient will call with progress report in one month. If symptoms persist will consider follow-up testing to document eradication of H. pylori infection.

## 2016-06-19 ENCOUNTER — Telehealth: Payer: Self-pay | Admitting: *Deleted

## 2016-06-19 ENCOUNTER — Encounter (INDEPENDENT_AMBULATORY_CARE_PROVIDER_SITE_OTHER): Payer: Self-pay | Admitting: Internal Medicine

## 2016-06-19 NOTE — Telephone Encounter (Signed)
Pt aware, routed to pcp 

## 2016-06-19 NOTE — Telephone Encounter (Signed)
-----   Message from Antoine Poche, MD sent at 06/18/2016  2:01 PM EDT ----- Potssium and kidney function mildly improved since last check, good to see they are moving in the right direction. Will f/u and discuss more at her upcoming f/u  Dominga Ferry MD

## 2016-06-27 ENCOUNTER — Ambulatory Visit (INDEPENDENT_AMBULATORY_CARE_PROVIDER_SITE_OTHER): Payer: Medicare Other | Admitting: Cardiology

## 2016-06-27 ENCOUNTER — Encounter: Payer: Self-pay | Admitting: Cardiology

## 2016-06-27 VITALS — BP 129/74 | HR 74 | Ht 60.0 in | Wt 153.2 lb

## 2016-06-27 DIAGNOSIS — I2510 Atherosclerotic heart disease of native coronary artery without angina pectoris: Secondary | ICD-10-CM

## 2016-06-27 DIAGNOSIS — I5022 Chronic systolic (congestive) heart failure: Secondary | ICD-10-CM | POA: Diagnosis not present

## 2016-06-27 DIAGNOSIS — R0602 Shortness of breath: Secondary | ICD-10-CM | POA: Diagnosis not present

## 2016-06-27 DIAGNOSIS — I4891 Unspecified atrial fibrillation: Secondary | ICD-10-CM | POA: Diagnosis not present

## 2016-06-27 NOTE — Patient Instructions (Signed)
Your physician recommends that you schedule a follow-up appointment in: 2 MONTHS WITH DR. BRANCH  Your physician recommends that you continue on your current medications as directed. Please refer to the Current Medication list given to you today.  Your physician has recommended that you have a pulmonary function test. Pulmonary Function Tests are a group of tests that measure how well air moves in and out of your lungs.  Your physician recommends that you return for lab work in: 3 WEEKS BMP/MG  Thank you for choosing [facility]!!

## 2016-06-27 NOTE — Progress Notes (Signed)
Clinical Summary Ana Pena is a 82 y.o.female seen today for follow up of the following medical problems.   1. Afib - 11/16/15 monitor with reviewed including full disclosure report, shows episodes of afib/aflutter with RVR as well as some sinus bradycardia.  - on admiodarone and eliquis  - denies any recent symptoms    2. CAD/ICM/Chronic systolic heart failure - history of prior DES to LAD in 06/2012  - cath 04/2013 with patent LAD stent  - LVEF 40-45% by echo 04/2013  - at a prior visit described some DOE with short distances. Notes some chest pain at times. Episode while at [facility]. Sharp pain in midchest, 10/10 in severity. No other symptoms. No positional. Lasted 15 minutes. Occurs once every 3-4 weeks. No relation to food.  - we repeated her echo which showed stable LVEF at 40-45%, grade I diastolic dysfunction.  - Lexiscan MPI 06/2015 showed no ischemia.  - since last visit symptoms of fatigue have continued. Did not improve with trial off coreg.   - benazepril stopped due to hyperkalemia. - taking torsemide 20mg  as needed, 2-3 times per week.    3. HTN - recently started on norvasc  4. Hypoxia - recent low O2 sats at home, sent to [facility] for evaluation 03/2016.  - admitted to [facility], thought possible related to COPD - started symbicort at discharge with some improvement in symptoms. Has never had formal PFTs   Past Medical History:  Diagnosis Date  . Abnormal CT scan    a. Question of possible renal cyst in RUQ by CT scan 06/2012 at [facility]. b. Redemonstrated 04/2013, pt instructed to f/u PCP.  [name] Arthritis    "all over" (05/11/2013)  . Asthma   . CAD (coronary artery disease)    a. s/p PTCA/DES to LAD 07/23/12. b. Mildly elevated trop 04/2013 with stable cath, no culprit.  . Chronic lower back pain   . Chronic venous insufficiency   . CKD (chronic kidney disease)   . Diastolic CHF (HCC)    a. Prior normal EF 06/2013. b. EF 40-45% by echo  04/2013.  . Diverticulosis   . GERD (gastroesophageal reflux disease)   . H/O hiatal hernia   . Hypercholesteremia   . Hypertension   . Hypothyroidism   . Iron deficiency anemia   . LBBB (left bundle Ana Pena block)      Allergies  Allergen Reactions  . Morphine And Related Nausea Only  . Sulfa Antibiotics     unknown  . Sulfa Drugs Cross Reactors     unknown     Current Outpatient Prescriptions  Medication Sig Dispense Refill  . amiodarone (PACERONE) 100 MG tablet Take 1 tablet (100 mg total) by mouth daily. 90 tablet 3  . amLODipine (NORVASC) 10 MG tablet Take 10 mg by mouth daily.    [name] atorvastatin (LIPITOR) 20 MG tablet Take 20 mg by mouth every evening.    . budesonide-formoterol (SYMBICORT) 80-4.5 MCG/ACT inhaler Inhale 2 puffs into the lungs 2 (two) times daily.    . carvedilol (COREG) 3.125 MG tablet Take 1 tablet (3.125 mg total) by mouth 2 (two) times daily. 180 tablet 3  . Cholecalciferol (VITAMIN D-3) 1000 UNITS CAPS Take 5,000 Units by mouth daily.     [name] dexlansoprazole (DEXILANT) 60 MG capsule Take 60 mg by mouth daily.    [name] ELIQUIS 2.5 MG TABS tablet TAKE ONE TABLET BY MOUTH TWICE A DAY 60 tablet 6  . levothyroxine (SYNTHROID, LEVOTHROID) 75  MCG tablet Take 75 mcg by mouth daily.    . Magnesium Oxide 200 MG TABS Take 200 mg by mouth 2 (two) times daily.    . nitroGLYCERIN (NITROSTAT) 0.4 MG SL tablet Place 0.4 mg under the tongue every 5 (five) minutes as needed for chest pain (up to 3 doses).    . ondansetron (ZOFRAN) 4 MG tablet Take 1 tablet (4 mg total) by mouth 2 (two) times daily as needed for nausea or vomiting. 30 tablet 1  . oxyCODONE-acetaminophen (PERCOCET/ROXICET) 5-325 MG per tablet Take 1 tablet by mouth 2 (two) times daily as needed.     . pregabalin (LYRICA) 50 MG capsule Take 50 mg by mouth 2 (two) times daily.    . psyllium (REGULOID) 0.52 G capsule Take 0.52 g by mouth 2 (two) times daily.     . Simethicone (PHAZYME) 180 MG CAPS Take 1 capsule  (180 mg total) by mouth 3 (three) times daily as needed.  0  . torsemide (DEMADEX) 20 MG tablet Take 1 tablet (20 mg total) by mouth daily. As needed for swelling 90 tablet 3   No current facility-administered medications for this visit.      Past Surgical History:  Procedure Laterality Date  . ABDOMINAL HYSTERECTOMY    . BREAST CYST EXCISION Right 1970's  . BUNIONECTOMY Bilateral   . CARDIAC CATHETERIZATION  05/11/2013  . CARPAL TUNNEL RELEASE Right   . CATARACT EXTRACTION W/ INTRAOCULAR LENS  IMPLANT, BILATERAL    . CHOLECYSTECTOMY    . CORONARY ANGIOPLASTY WITH STENT PLACEMENT  06/2012   DES proximal LAD Ana Pena 05/11/2013  . ESOPHAGOGASTRODUODENOSCOPY N/A 02/14/2016   Procedure: ESOPHAGOGASTRODUODENOSCOPY (EGD);  Surgeon: Malissa Hippo, MD;  Location: [facility];  Service: Endoscopy;  Laterality: N/A;  1:40  . HAMMER TOE SURGERY Bilateral   . LEFT HEART CATHETERIZATION WITH CORONARY ANGIOGRAM N/A 07/23/2012   Procedure: LEFT HEART CATHETERIZATION WITH CORONARY ANGIOGRAM;  Surgeon: Kathleene Hazel, MD;  Location: [facility] CATH LAB;  Service: Cardiovascular;  Laterality: N/A;  . LEFT HEART CATHETERIZATION WITH CORONARY ANGIOGRAM N/A 05/11/2013   Procedure: LEFT HEART CATHETERIZATION WITH CORONARY ANGIOGRAM;  Surgeon: Kathleene Hazel, MD;  Location: [facility] CATH LAB;  Service: Cardiovascular;  Laterality: N/A;  . PERCUTANEOUS CORONARY STENT INTERVENTION (PCI-S)  07/23/2012   Procedure: PERCUTANEOUS CORONARY STENT INTERVENTION (PCI-S);  Surgeon: Kathleene Hazel, MD;  Location: [facility] CATH LAB;  Service: Cardiovascular;;  . TOE SURGERY     "don't really remember which toe or which side; it was real crooked and had to be straightened" (05/11/2013)  . TOTAL KNEE ARTHROPLASTY Right 2000     Allergies  Allergen Reactions  . Morphine And Related Nausea Only  . Sulfa Antibiotics     unknown  . Sulfa Drugs Cross Reactors     unknown      Family History  Problem Relation Age of  Onset  . Breast cancer Mother   . Heart disease Father      Social History Ms. Ansell reports that she has never smoked. She has never used smokeless tobacco. Ms. Edmunds reports that she does not drink alcohol.   Review of Systems CONSTITUTIONAL: +fatigue HEENT: Eyes: No visual loss, blurred vision, double vision or yellow sclerae.No hearing loss, sneezing, congestion, runny nose or sore throat.  SKIN: No rash or itching.  CARDIOVASCULAR: per HPI RESPIRATORY: +SOB GASTROINTESTINAL: No anorexia, nausea, vomiting or diarrhea. No abdominal pain or blood.  GENITOURINARY: No burning on urination, no polyuria NEUROLOGICAL: No  headache, dizziness, syncope, paralysis, ataxia, numbness or tingling in the extremities. No change in bowel or bladder control.  MUSCULOSKELETAL: No muscle, back pain, joint pain or stiffness.  LYMPHATICS: No enlarged nodes. No history of splenectomy.  PSYCHIATRIC: No history of depression or anxiety.  ENDOCRINOLOGIC: No reports of sweating, cold or heat intolerance. No polyuria or polydipsia.  [name]   Physical Examination Vitals:   06/27/16 1328  BP: 129/74  Pulse: 74   Vitals:   06/27/16 1328  Weight: 153 lb 3.2 oz (69.5 kg)  Height: 5' (1.524 m)    Gen: resting comfortably, no acute distress HEENT: no scleral icterus, pupils equal round and reactive, no palptable cervical adenopathy,  CV: RRR, no m/r/g, no jvd Resp: Clear to auscultation bilaterally GI: abdomen is soft, non-tender, non-distended, normal bowel sounds, no hepatosplenomegaly MSK: extremities are warm, no edema.  Skin: warm, no rash Neuro:  no focal deficits Psych: appropriate affect   Diagnostic Studies 04/2013 Cath  Hemodynamic Findings:  Central aortic pressure: 161/60  Left ventricular pressure: 159/8/13  Angiographic Findings:  Left main: No obstructive disease.  Left Anterior Descending Artery: Large caliber vessel that courses to the apex. The proximal vessel has 20%  stenosis. The mid vessel has a patent stented segment with no restenosis noted. The diagonal is patent. The mid to distal vessel has a focal 50% smooth stenosis which does not appear to be flow limiting, unchanged in appearance from cath in 2013.  Circumflex Artery: Moderate sized vessel with proximal plaque, 40% smooth stenosis in the mid vessel. The first OM is small and patent. The AV groove Circumflex terminates into a moderate caliber second OM Ana Pena.  Right Coronary Artery: Large, dominant vessel with 30% mid stenosis.  Left Ventricular Angiogram: Deferred.  Impression:  1. Stable single vessel CAD with patent stent proximal to mid LAD  2. Mild to moderate non-obstructive disease in the distal LAD, distal Circumflex and mid RCA  Recommendations: Continue medical management. Her d-dimer is slightly elevated but CTA chest at [facility] without evidence of PE. Will monitor overnight.   04/2013 Echo  LVEF 40-45%, grade I diastolic dysfunction,   06/2015 echo Study Conclusions  - Left ventricle: The cavity size was normal. There was mild concentric hypertrophy. Systolic function was mildly to moderately reduced. The estimated ejection fraction was in the range of 40% to 45%. Diffuse hypokinesis. Doppler parameters are consistent with abnormal left ventricular relaxation (grade 1 diastolic dysfunction). Elevated filling pressures. - Ventricular septum: Septal motion showed abnormal function and dyssynergy. These changes are consistent with intraventricular conduction delay. - Aortic valve: There was trivial regurgitation. - Mitral valve: Mildly thickened leaflets . There was mild regurgitation. - Left atrium: The atrium was mildly dilated. - Tricuspid valve: There was mild regurgitation. - Pulmonary arteries: PA peak pressure: 38 mm Hg (S). Mildly elevated pulmonary pressures.  06/2015 Lexiscan MPI  LBBB present throughout study.  There is a small defect  of mild severity present in the mid anteroseptal, apical anterior and apical septal location. The defect is non-reversible. Consistent with scar versus soft tissue attenuation. No definite ischemia.  This is a low risk study.  Nuclear stress EF: 53%. Mild global hypokinesis    Assessment and Plan  1. Afib -no current symptoms - continue current meds - continue eliquis for stroke prevention, CHADS2Vasc is 6.    2. CAD/ICM/Chronic systolic HF - appears euvolemic, we will conitnue current meds - repeat BMET and Mg labs  3. HTN - reasonable control given her  age, we will continue current meds   4. SOB - recent negative stress test, regarding her systolic HF she appears euvolemic. Symptoms improved after recently starting symbicort while in hospital. We will order formal PFTs  F/u 2 months          Antoine Poche, M.D.

## 2016-07-03 ENCOUNTER — Ambulatory Visit (HOSPITAL_COMMUNITY)
Admission: RE | Admit: 2016-07-03 | Discharge: 2016-07-03 | Disposition: A | Discharge status: Home / Self Care | Payer: Medicare Other | Source: Ambulatory Visit | Attending: Cardiology | Admitting: Cardiology

## 2016-07-03 DIAGNOSIS — J988 Other specified respiratory disorders: Secondary | ICD-10-CM | POA: Diagnosis not present

## 2016-07-03 DIAGNOSIS — R0602 Shortness of breath: Secondary | ICD-10-CM | POA: Diagnosis present

## 2016-07-03 LAB — PULMONARY FUNCTION TEST
DL/VA % pred: 76 %
DL/VA: 3.25 ml/min/mmHg/L
DLCO unc % pred: 40 %
DLCO unc: 7.57 ml/min/mmHg
FEF 25-75 Post: 1.01 L/sec
FEF 25-75 Pre: 1.01 L/sec
FEF2575-%CHANGE-POST: 0 %
FEF2575-%PRED-POST: 129 %
FEF2575-%Pred-Pre: 129 %
FEV1-%CHANGE-POST: 0 %
FEV1-%PRED-PRE: 77 %
FEV1-%Pred-Post: 77 %
FEV1-POST: 1 L
FEV1-PRE: 1.01 L
FEV1FVC-%Change-Post: 0 %
FEV1FVC-%PRED-PRE: 116 %
FEV6-%Change-Post: 0 %
FEV6-%Pred-Post: 71 %
FEV6-%Pred-Pre: 72 %
FEV6-POST: 1.19 L
FEV6-PRE: 1.2 L
FEV6FVC-%PRED-PRE: 107 %
FEV6FVC-%Pred-Post: 107 %
FVC-%CHANGE-POST: 0 %
FVC-%PRED-POST: 66 %
FVC-%PRED-PRE: 67 %
FVC-POST: 1.19 L
FVC-PRE: 1.2 L
POST FEV1/FVC RATIO: 84 %
PRE FEV6/FVC RATIO: 100 %
Post FEV6/FVC ratio: 100 %
Pre FEV1/FVC ratio: 84 %
RV % PRED: 80 %
RV: 1.87 L
TLC % pred: 79 %
TLC: 3.53 L

## 2016-07-03 MED ORDER — ALBUTEROL SULFATE (2.5 MG/3ML) 0.083% IN NEBU
2.5000 mg | INHALATION_SOLUTION | Freq: Once | RESPIRATORY_TRACT | Status: AC
  Administered 2016-07-03: 2.5 mg via RESPIRATORY_TRACT

## 2016-07-05 ENCOUNTER — Telehealth: Payer: Self-pay | Admitting: *Deleted

## 2016-07-05 NOTE — Telephone Encounter (Signed)
-----   Message from Antoine PocheJonathan F Branch, MD sent at 07/05/2016 10:19 AM EDT ----- Breathing tests overall look good. We will discuss further at our f/u. So far no clear cause of her SOB has been found  Dominga FerryJ Branch MD

## 2016-07-05 NOTE — Telephone Encounter (Signed)
Pt aware, routed to pcp 

## 2016-07-18 ENCOUNTER — Ambulatory Visit: Payer: Medicare Other | Admitting: Cardiology

## 2016-07-30 ENCOUNTER — Telehealth: Payer: Self-pay | Admitting: *Deleted

## 2016-07-30 DIAGNOSIS — I10 Essential (primary) hypertension: Secondary | ICD-10-CM

## 2016-07-30 DIAGNOSIS — E875 Hyperkalemia: Secondary | ICD-10-CM

## 2016-07-30 MED ORDER — SODIUM POLYSTYRENE SULFONATE PO POWD: Freq: Once | ORAL | 0 refills | Status: AC

## 2016-07-30 NOTE — Telephone Encounter (Signed)
Pt aware - says she doesn't take torsemide often - last torsemide pt took was last week. Pt has appt with pcp tomorrow (will forward lab results) mail pt lab orders and send medication to pharmacy

## 2016-07-30 NOTE — Telephone Encounter (Signed)
-----   Message from Antoine PocheJonathan F Branch, MD sent at 07/26/2016  9:59 AM EDT ----- Labs overall stable, renal function remains decreased and potassium remains elevated. Please clarify how often she is taking torsemide? Please forward labs to pcp. We need to continue to monitor closely, please repeat BMET and Mg in 2 weeks. I would like to bring her potassium down, can we give her a prescription for kayexelate 30g po x1. She needs to f/u with pcp to look in to causes for high potassium, she is not on any meds that tend to cause it.   Dominga FerryJ Branch MD

## 2016-08-14 ENCOUNTER — Encounter: Payer: Self-pay | Admitting: *Deleted

## 2016-08-15 ENCOUNTER — Ambulatory Visit (INDEPENDENT_AMBULATORY_CARE_PROVIDER_SITE_OTHER): Payer: Medicare Other | Admitting: *Deleted

## 2016-08-15 DIAGNOSIS — I4891 Unspecified atrial fibrillation: Secondary | ICD-10-CM | POA: Diagnosis not present

## 2016-08-15 NOTE — Progress Notes (Signed)
Pt was started on Eliquis 11/30/15 by Dr Wyline MoodBranch for atrial fib/flutter.   Pt denies problems with Eliquis since last visit. Denies any increased bruising, bleeding or GI upset.  Reviewed patients medication list. Pt is not currently on any combined P-gp and strong CYP3A4 inhibitors/inducers (ketoconazole, traconazole, ritonavir, carbamazepine, phenytoin, rifampin, St. John's wort). [facility]. SCr 1.7, Weight 71.1kg, CrCl 26.17. Dose is appropriate based on 2 out of 3 criteria (age & SCr). Hgb and HCT: 13.3/41.4  A full discussion of the nature of anticoagulants has been carried out. A benefit/risk analysis has been presented to the patient, so that they understand the justification for choosing anticoagulation with Eliquis at this time. The need for compliance is stressed. Pt is aware to take the medication twice daily. Side effects of potential bleeding are discussed, including unusual colored urine or stools, coughing up blood or coffee ground emesis, nose bleeds or serious fall or head trauma. Discussed signs and symptoms of stroke. The patient should avoid any OTC items containing aspirin or ibuprofen. Avoid alcohol consumption. Call if any signs of abnormal bleeding. Discussed financial obligations and resolved any difficulty in obtaining medication. BMP and CBC reordered today [facility]  During [facility].  Pt states she was dehydrated.  HGB was low and SrCr was elevated more since last check.  Dr Wyline MoodBranch aware.    Discussed results with pt/daughter.  Labs stable.  Next Eliquis f/u in 4 months.  Appt made.

## 2016-08-16 ENCOUNTER — Other Ambulatory Visit (INDEPENDENT_AMBULATORY_CARE_PROVIDER_SITE_OTHER): Payer: Self-pay | Admitting: Internal Medicine

## 2016-08-16 ENCOUNTER — Telehealth: Payer: Self-pay | Admitting: *Deleted

## 2016-08-16 NOTE — Telephone Encounter (Signed)
-----   Message from Antoine PocheJonathan F Branch, MD sent at 08/16/2016 10:33 AM EDT ----- Kidney function mildly better from last check, her high potassium levels have normalized. Thyroid levels are off, please forward to pcp and make patient aware she needs to discuss with pcp  J BrancH MD

## 2016-08-16 NOTE — Telephone Encounter (Signed)
Pt daughter Ana Pena aware, says pcp increased Synthroid 100 mcg daily per thyroid results. Updated med list

## 2016-09-12 ENCOUNTER — Ambulatory Visit: Payer: Medicare Other | Admitting: Cardiology

## 2016-09-17 ENCOUNTER — Ambulatory Visit (INDEPENDENT_AMBULATORY_CARE_PROVIDER_SITE_OTHER): Payer: Medicare Other | Admitting: Cardiology

## 2016-09-17 ENCOUNTER — Encounter: Payer: Self-pay | Admitting: Cardiology

## 2016-09-17 VITALS — BP 113/69 | HR 67 | Ht 60.0 in | Wt 154.8 lb

## 2016-09-17 DIAGNOSIS — I10 Essential (primary) hypertension: Secondary | ICD-10-CM

## 2016-09-17 DIAGNOSIS — I4891 Unspecified atrial fibrillation: Secondary | ICD-10-CM | POA: Diagnosis not present

## 2016-09-17 DIAGNOSIS — I2510 Atherosclerotic heart disease of native coronary artery without angina pectoris: Secondary | ICD-10-CM

## 2016-09-17 DIAGNOSIS — I5022 Chronic systolic (congestive) heart failure: Secondary | ICD-10-CM

## 2016-09-17 MED ORDER — AMLODIPINE BESYLATE 5 MG PO TABS: 5.0000 mg | ORAL_TABLET | Freq: Every day | ORAL | 3 refills | Status: DC

## 2016-09-17 NOTE — Patient Instructions (Signed)
Your physician wants you to follow-up in: 6 MONTHS WITH DR. BRANCH  You will receive a reminder letter in the mail two months in advance. If you don't receive a letter, please call our office to schedule the follow-up appointment.  Your physician has recommended you make the following change in your medication:   DECREASE AMLODIPINE 5 MG DAILY  UPDATE US IN 1 WEEK REGARDING YOUR DIZZINESS  Thank you for choosing [facility]!!

## 2016-09-17 NOTE — Progress Notes (Signed)
Clinical Summary Ana Pena is a 82 y.o.female seen today for follow up of the following medical problems.   1. Afib - 11/16/15 monitor with reviewed including full disclosure report, shows episodes of afib/aflutter with RVR as well as some sinus bradycardia.  - on admiodarone and eliquis  - denies any recent symptoms. NO recent bleeding issues on eliquis.  - compliant with meds.    2. CAD/ICM/Chronic systolic heart failure - history of prior DES to LAD in 06/2012  - cath 04/2013 with patent LAD stent  - LVEF 40-45% by echo 04/2013  - at a prior visit described some DOE with short distances. Notes some chest pain at times. Episode while at [facility]. Sharp pain in midchest, 10/10 in severity. No other symptoms. No positional. Lasted 15 minutes. Occurs once every 3-4 weeks. No relation to food.  - we repeated her echo which showed stable LVEF at 40-45%, grade I diastolic dysfunction.  - Lexiscan MPI 06/2015 showed no ischemia. - benazepril stopped due to hyperkalemia. Generalized fatigue did not improve with trial off coreg   - taking torsemide 20mg  as needed, 2-3 times per week.  - no recent chest pain. Breathing has improved since starting inhalers. No significant LE edema.  3. HTN - compliant with meds  4. Dizziness - some recent vertigo. Though can have some orthostatic symptoms as well   Past Medical History:  Diagnosis Date  . Abnormal CT scan    a. Question of possible renal cyst in RUQ by CT scan 06/2012 at [facility]. b. Redemonstrated 04/2013, pt instructed to f/u PCP.  [name] Arthritis    "all over" (05/11/2013)  . Asthma   . CAD (coronary artery disease)    a. s/p PTCA/DES to LAD 07/23/12. b. Mildly elevated trop 04/2013 with stable cath, no culprit.  . Chronic lower back pain   . Chronic venous insufficiency   . CKD (chronic kidney disease)   . Diastolic CHF (HCC)    a. Prior normal EF 06/2013. b. EF 40-45% by echo 04/2013.  . Diverticulosis   .  GERD (gastroesophageal reflux disease)   . H/O hiatal hernia   . Hypercholesteremia   . Hypertension   . Hypothyroidism   . Iron deficiency anemia   . LBBB (left bundle Crescencio Jozwiak block)      Allergies  Allergen Reactions  . Morphine And Related Nausea Only  . Sulfa Antibiotics     unknown  . Sulfa Drugs Cross Reactors     unknown     Current Outpatient Prescriptions  Medication Sig Dispense Refill  . amiodarone (PACERONE) 100 MG tablet Take 1 tablet (100 mg total) by mouth daily. 90 tablet 3  . amLODipine (NORVASC) 10 MG tablet Take 10 mg by mouth daily.    [name] atorvastatin (LIPITOR) 20 MG tablet Take 20 mg by mouth every evening.    . carvedilol (COREG) 3.125 MG tablet Take 1 tablet (3.125 mg total) by mouth 2 (two) times daily. 180 tablet 3  . Cholecalciferol (VITAMIN D-3) 1000 UNITS CAPS Take 5,000 Units by mouth daily.     [name] dexlansoprazole (DEXILANT) 60 MG capsule Take 60 mg by mouth daily.    [name] ELIQUIS 2.5 MG TABS tablet TAKE ONE TABLET BY MOUTH TWICE A DAY 60 tablet 6  . levothyroxine (SYNTHROID, LEVOTHROID) 100 MCG tablet Take 100 mcg by mouth daily before breakfast.    . Magnesium Oxide 200 MG TABS Take 200 mg by mouth 2 (two) times daily.    [name]  nitroGLYCERIN (NITROSTAT) 0.4 MG SL tablet Place 0.4 mg under the tongue every 5 (five) minutes as needed for chest pain (up to 3 doses).    . ondansetron (ZOFRAN) 4 MG tablet TAKE ONE TABLET BY MOUTH TWICE A DAY AS NEEDED FOR NAUSEA OR VOMITING 30 tablet 0  . pregabalin (LYRICA) 50 MG capsule Take 50 mg by mouth 2 (two) times daily.    . psyllium (REGULOID) 0.52 G capsule Take 0.52 g by mouth 2 (two) times daily.     . Simethicone (PHAZYME) 180 MG CAPS Take 1 capsule (180 mg total) by mouth 3 (three) times daily as needed.  0  . SYMBICORT 160-4.5 MCG/ACT inhaler Inhale 1 puff into the lungs 2 (two) times daily.    [name]. torsemide (DEMADEX) 20 MG tablet Take 1 tablet (20 mg total) by mouth daily. As needed for swelling 90 tablet 3  .  traMADol (ULTRAM) 50 MG tablet Take 1 tablet by mouth 2 (two) times daily.     No current facility-administered medications for this visit.      Past Surgical History:  Procedure Laterality Date  . ABDOMINAL HYSTERECTOMY    . BREAST CYST EXCISION Right 1970's  . BUNIONECTOMY Bilateral   . CARDIAC CATHETERIZATION  05/11/2013  . CARPAL TUNNEL RELEASE Right   . CATARACT EXTRACTION W/ INTRAOCULAR LENS  IMPLANT, BILATERAL    . CHOLECYSTECTOMY    . CORONARY ANGIOPLASTY WITH STENT PLACEMENT  06/2012   DES proximal LAD [name]/notes 05/11/2013  . ESOPHAGOGASTRODUODENOSCOPY N/A 02/14/2016   Procedure: ESOPHAGOGASTRODUODENOSCOPY (EGD);  Surgeon: Malissa HippoNajeeb U Rehman, MD;  Location: [facility];  Service: Endoscopy;  Laterality: N/A;  1:40  . HAMMER TOE SURGERY Bilateral   . LEFT HEART CATHETERIZATION WITH CORONARY ANGIOGRAM N/A 07/23/2012   Procedure: LEFT HEART CATHETERIZATION WITH CORONARY ANGIOGRAM;  Surgeon: Kathleene Hazelhristopher D McAlhany, MD;  [facility];  Service: Cardiovascular;  Laterality: N/A;  . LEFT HEART CATHETERIZATION WITH CORONARY ANGIOGRAM N/A 05/11/2013   Procedure: LEFT HEART CATHETERIZATION WITH CORONARY ANGIOGRAM;  Surgeon: Kathleene Hazelhristopher D McAlhany, MD;  [facility];  Service: Cardiovascular;  Laterality: N/A;  . PERCUTANEOUS CORONARY STENT INTERVENTION (PCI-S)  07/23/2012   Procedure: PERCUTANEOUS CORONARY STENT INTERVENTION (PCI-S);  Surgeon: Kathleene Hazelhristopher D McAlhany, MD;  [facility];  Service: Cardiovascular;;  . TOE SURGERY     "don't really remember which toe or which side; it was real crooked and had to be straightened" (05/11/2013)  . TOTAL KNEE ARTHROPLASTY Right 2000     Allergies  Allergen Reactions  . Morphine And Related Nausea Only  . Sulfa Antibiotics     unknown  . Sulfa Drugs Cross Reactors     unknown      Family History  Problem Relation Age of Onset  . Breast cancer Mother   . Heart disease Father      Social History Ms. Mayer Camelatum reports  that she has never smoked. She has never used smokeless tobacco. Ms. Mayer Camelatum reports that she does not drink alcohol.   Review of Systems CONSTITUTIONAL: No weight loss, fever, chills, weakness or fatigue.  HEENT: Eyes: No visual loss, blurred vision, double vision or yellow sclerae.No hearing loss, sneezing, congestion, runny nose or sore throat.  SKIN: No rash or itching.  CARDIOVASCULAR:  RESPIRATORY: No shortness of breath, cough or sputum.  GASTROINTESTINAL: No anorexia, nausea, vomiting or diarrhea. No abdominal pain or blood.  GENITOURINARY: No burning on urination, no polyuria NEUROLOGICAL: No headache, dizziness, syncope, paralysis, ataxia, numbness  or tingling in the extremities. No change in bowel or bladder control.  MUSCULOSKELETAL: No muscle, back pain, joint pain or stiffness.  LYMPHATICS: No enlarged nodes. No history of splenectomy.  PSYCHIATRIC: No history of depression or anxiety.  ENDOCRINOLOGIC: No reports of sweating, cold or heat intolerance. No polyuria or polydipsia.  [name]   Physical Examination There were no vitals filed for this visit. There were no vitals filed for this visit.  Gen: resting comfortably, no acute distress HEENT: no scleral icterus, pupils equal round and reactive, no palptable cervical adenopathy,  CV Resp: Clear to auscultation bilaterally GI: abdomen is soft, non-tender, non-distended, normal bowel sounds, no hepatosplenomegaly MSK: extremities are warm, no edema.  Skin: warm, no rash Neuro:  no focal deficits Psych: appropriate affect   Diagnostic Studies  04/2013 Cath Hemodynamic Findings: Central aortic pressure: 161/60  Left ventricular pressure: 159/8/13  Angiographic Findings:  Left main: No obstructive disease.  Left Anterior Descending Artery: Large caliber vessel that courses to the apex. The proximal vessel has 20% stenosis. The mid vessel has a patent stented segment with no restenosis noted. The diagonal is patent.  The mid to distal vessel has a focal 50% smooth stenosis which does not appear to be flow limiting, unchanged in appearance from cath in 2013.  Circumflex Artery: Moderate sized vessel with proximal plaque, 40% smooth stenosis in the mid vessel. The first OM is small and patent. The AV groove Circumflex terminates into a moderate caliber second OM Nitisha Civello.  Right Coronary Artery: Large, dominant vessel with 30% mid stenosis.  Left Ventricular Angiogram: Deferred.  Impression:  1. Stable single vessel CAD with patent stent proximal to mid LAD  2. Mild to moderate non-obstructive disease in the distal LAD, distal Circumflex and mid RCA  Recommendations: Continue medical management. Her d-dimer is slightly elevated but [facility] chest at [facility] without evidence of PE. Will monitor overnight.   04/2013 Echo LVEF 40-45%, grade I diastolic dysfunction,   06/2015 echo Study Conclusions  - Left ventricle: The cavity size was normal. There was mild concentric hypertrophy. Systolic function was mildly to moderately reduced. The estimated ejection fraction was in the range of 40% to 45%. Diffuse hypokinesis. Doppler parameters are consistent with abnormal left ventricular relaxation (grade 1 diastolic dysfunction). Elevated filling pressures. - Ventricular septum: Septal motion showed abnormal function and dyssynergy. These changes are consistent with intraventricular conduction delay. - Aortic valve: There was trivial regurgitation. - Mitral valve: Mildly thickened leaflets . There was mild regurgitation. - Left atrium: The atrium was mildly dilated. - Tricuspid valve: There was mild regurgitation. - Pulmonary arteries: PA peak pressure: 38 mm Hg (S). Mildly elevated pulmonary pressures.  06/2015 Lexiscan MPI  LBBB present throughout study.  There is a small defect of mild severity present in the mid anteroseptal, apical anterior and apical septal location. The  defect is non-reversible. Consistent with scar versus soft tissue attenuation. No definite ischemia.  This is a low risk study.  Nuclear stress EF: 53%. Mild global hypokinesis   Assessment and Plan   1. Afib -no current symptoms, continue current meds - continue eliquis for stroke prevention, CHADS2Vasc is 6.    2. CAD/ICM/Chronic systolic HF - appears euvolemic,no recent symptoms - cotninue current meds   3. HTN - recent orthostatic symptoms, we will lower norvasc dose to 5mg  daily. Accept higher bp target for her given age and dizziness.     F/u 6 months     Antoine Poche, M.D.

## 2016-09-25 ENCOUNTER — Telehealth: Payer: Self-pay | Admitting: Cardiology

## 2016-09-25 MED ORDER — AMLODIPINE BESYLATE 5 MG PO TABS: 2.5000 mg | ORAL_TABLET | Freq: Every day | ORAL | Status: DC

## 2016-09-25 NOTE — Telephone Encounter (Signed)
Have her decrease her norvasc to 2.5mg  daily and update us in another 10 days.  J Earlee Herald MD

## 2016-09-25 NOTE — Telephone Encounter (Signed)
Daughter Marthe Patch([name] Cousins) notified & verbalized understanding.

## 2016-09-25 NOTE — Telephone Encounter (Signed)
Ana Pena called stating that Dr. Wyline MoodBranch wanted her to call after decreasing her BP medication for 10 days.  States no better continues to have dizzy spells.

## 2016-12-09 ENCOUNTER — Other Ambulatory Visit: Payer: Self-pay | Admitting: Cardiology

## 2016-12-24 ENCOUNTER — Ambulatory Visit (INDEPENDENT_AMBULATORY_CARE_PROVIDER_SITE_OTHER): Payer: Medicare Other | Admitting: Internal Medicine

## 2016-12-24 ENCOUNTER — Telehealth: Payer: Self-pay | Admitting: *Deleted

## 2016-12-24 NOTE — Telephone Encounter (Signed)
Can stop coreg and start ToproL XL 25mg  daily   Dominga FerryJ Branch MD

## 2016-12-24 NOTE — Telephone Encounter (Signed)
Ana Pena made aware- gave verbal rx - updated medication list - pharmacy will make pt aware

## 2016-12-24 NOTE — Telephone Encounter (Signed)
Ana Pena from [facility] pharmacy requesting to change carvedilol to another beta blocker liek atenolol - says pt having more frequent asthma/COPD

## 2017-01-07 ENCOUNTER — Ambulatory Visit (INDEPENDENT_AMBULATORY_CARE_PROVIDER_SITE_OTHER): Payer: Medicare Other | Admitting: *Deleted

## 2017-01-07 DIAGNOSIS — I482 Chronic atrial fibrillation, unspecified: Secondary | ICD-10-CM

## 2017-01-07 NOTE — Progress Notes (Signed)
Pt was started on Eliquis 2.5mg  bid on 11/30/15 by Dr Wyline MoodBranch for atrial fib/flutter.   Pt denies problems with Eliquis since last visit. Denies any increased bruising, bleeding or GI upset.  Reviewed patients medication list. Pt is not currently on any combined P-gp and strong CYP3A4 inhibitors/inducers (ketoconazole, traconazole, ritonavir, carbamazepine, phenytoin, rifampin, St. John's wort). Reviewed labs from [facility] on 01/07/17. SCr 1.39, Weight 71.1kg, CrCl 31.36. Dose is appropriate based on 2 out of 3 criteria (age &SCr). Hgb and HCT: 14.1/41.8  A full discussion of the nature of anticoagulants has been carried out. A benefit/risk analysis has been presented to the patient, so that they understand the justification for choosing anticoagulation with Eliquis at this time. The need for compliance is stressed. Pt is aware to take the medication twice daily. Side effects of potential bleeding are discussed, including unusual colored urine or stools, coughing up blood or coffee ground emesis, nose bleeds or serious fall or head trauma. Discussed signs and symptoms of stroke. The patient should avoid any OTC items containing aspirin or ibuprofen. Avoid alcohol consumption. Call if any signs of abnormal bleeding. Discussed financial obligations and resolved any difficulty in obtaining medication.    Lab results were discussed with pt's daughter by Burman NievesStaci Ashworth.  Follow up in 4 months.

## 2017-01-10 ENCOUNTER — Telehealth: Payer: Self-pay | Admitting: *Deleted

## 2017-01-10 DIAGNOSIS — I10 Essential (primary) hypertension: Secondary | ICD-10-CM

## 2017-01-10 MED ORDER — KAYEXALATE PO POWD: ORAL | 0 refills | Status: DC

## 2017-01-10 NOTE — Telephone Encounter (Signed)
-----   Message from Antoine PocheJonathan F Branch, MD sent at 01/10/2017  2:02 PM EST ----- Labs show potassium is too high. Is she taking any potassium of vitamins at home? SHe needs kayexelate 30g po x 2 doses. Repeat BMET and Mg in 1 week   Dominga FerryJ Branch MD

## 2017-01-10 NOTE — Telephone Encounter (Signed)
Pt daughter Pamelia HoitRonda [facility]) voiced understanding - denies pt taking any potassium vitamins at home - will sent medication in and mail pt lab orders

## 2017-01-23 ENCOUNTER — Telehealth: Payer: Self-pay | Admitting: *Deleted

## 2017-01-23 ENCOUNTER — Encounter: Payer: Self-pay | Admitting: *Deleted

## 2017-01-23 NOTE — Telephone Encounter (Signed)
Left multiple messages - mailed pt results letter - routed to pcp  

## 2017-01-23 NOTE — Telephone Encounter (Signed)
-----   Message from Antoine PocheJonathan F Branch, MD sent at 01/20/2017 12:50 PM EST ----- Potassium is better. Labs suggest she may be a little dehydrated, needs to drink more water.   J BrancH MD

## 2017-03-17 ENCOUNTER — Encounter: Payer: Self-pay | Admitting: *Deleted

## 2017-03-17 ENCOUNTER — Ambulatory Visit (INDEPENDENT_AMBULATORY_CARE_PROVIDER_SITE_OTHER): Payer: Medicare Other | Admitting: Cardiology

## 2017-03-17 ENCOUNTER — Encounter: Payer: Self-pay | Admitting: Cardiology

## 2017-03-17 VITALS — BP 163/77 | HR 62 | Ht 60.0 in | Wt 156.6 lb

## 2017-03-17 DIAGNOSIS — I2510 Atherosclerotic heart disease of native coronary artery without angina pectoris: Secondary | ICD-10-CM | POA: Diagnosis not present

## 2017-03-17 DIAGNOSIS — R0602 Shortness of breath: Secondary | ICD-10-CM | POA: Diagnosis not present

## 2017-03-17 DIAGNOSIS — I4891 Unspecified atrial fibrillation: Secondary | ICD-10-CM | POA: Diagnosis not present

## 2017-03-17 DIAGNOSIS — R29898 Other symptoms and signs involving the musculoskeletal system: Secondary | ICD-10-CM | POA: Diagnosis not present

## 2017-03-17 DIAGNOSIS — I5022 Chronic systolic (congestive) heart failure: Secondary | ICD-10-CM

## 2017-03-17 NOTE — Patient Instructions (Signed)
Your physician recommends that you schedule a follow-up appointment in: TO BE DETERMINED WITH DR [facility]   Your physician recommends that you continue on your current medications as directed. Please refer to the Current Medication list given to you today.  A chest x-ray takes a picture of the organs and structures inside the chest, including the heart, lungs, and blood vessels. This test can show several things, including, whether the heart is enlarges; whether fluid is building up in the lungs; and whether pacemaker / defibrillator leads are still in place.  You have been referred to PHYSICAL THERAPY   Your physician recommends that you return for lab work CBC/TSH/CBC/BNP  Your physician has requested that you regularly monitor and record your blood pressure readings at home FOR 1 WEEK AND CALL us WITH READINGS. Please use the same machine at the same time of day to check your readings and record them to bring to your follow-up visit.  Thank you for choosing [facility]!!

## 2017-03-17 NOTE — Progress Notes (Signed)
Clinical Summary Ms. Herro is a 83 y.o.female seen today for follow up of the following medical problems.   1. Afib - 11/16/15 monitor with reviewed including full disclosure report, shows episodes of afib/aflutter with RVR as well as some sinus bradycardia.  - on admiodarone and eliquis  - no recent palpitations. No bleeding troubles on eliquis.   2. CAD/ICM/Chronic systolic heart failure - history of prior DES to LAD in 06/2012  - cath 04/2013 with patent LAD stent  - LVEF 40-45% by echo 04/2013  - at a prior visit described some DOE with short distances. Notes some chest pain at times. Episode while at [facility]. Sharp pain in midchest, 10/10 in severity. No other symptoms. No positional. Lasted 15 minutes. Occurs once every 3-4 weeks. No relation to food.  - we repeated her echo which showed stable LVEF at 40-45%, grade I diastolic dysfunction.  - Lexiscan MPI 06/2015 showed no ischemia. - benazepril stopped due to hyperkalemia. Generalized fatigue did not improve with trial off coreg   - we recently changed coreg to Toprol as thought could be causing some bronchospasm. - recent increased SOB, now on home O2.   3. HTN - elevated in clinic - at pcp visit 01/2017 bps 120s/70s.  - we had previously lowered her norvasc due to orthostatic symptoms. On further history appears being off balance with standing is more so related to leg weakness as opposed to dizziness. Since lowering norvasc no improvement in symptoms.    4. History of vertigo - can have dizziness with turning head  5. Hyperkalemia - elevated K in 12/2016, took kayexelate x 2 doses - repeat labs 12/2016 K had normalized, though had a mild uptrend in Cr    6. Pneumonia - episode early this year, seen in ER - completed course of abx - inhaler changed from symbicort - hypoxia without home O2. 2 Liters  - no recent edema. Home weight up some, roughly 4 pounds which is within her range. - has  some occasional wheezing.   Past Medical History:  Diagnosis Date  . Abnormal CT scan    a. Question of possible renal cyst in RUQ by CT scan 06/2012 at [facility]. b. Redemonstrated 04/2013, pt instructed to f/u PCP.  [name] Arthritis    "all over" (05/11/2013)  . Asthma   . CAD (coronary artery disease)    a. s/p PTCA/DES to LAD 07/23/12. b. Mildly elevated trop 04/2013 with stable cath, no culprit.  . Chronic lower back pain   . Chronic venous insufficiency   . CKD (chronic kidney disease)   . Diastolic CHF (HCC)    a. Prior normal EF 06/2013. b. EF 40-45% by echo 04/2013.  . Diverticulosis   . GERD (gastroesophageal reflux disease)   . H/O hiatal hernia   . Hypercholesteremia   . Hypertension   . Hypothyroidism   . Iron deficiency anemia   . LBBB (left bundle Lastacia Solum block)      Allergies  Allergen Reactions  . Morphine And Related Nausea Only  . Sulfa Antibiotics     unknown  . Sulfa Drugs Cross Reactors     unknown     Current Outpatient Prescriptions  Medication Sig Dispense Refill  . amiodarone (PACERONE) 100 MG tablet Take 1 tablet (100 mg total) by mouth daily. 90 tablet 3  . amLODipine (NORVASC) 5 MG tablet Take 0.5 tablets (2.5 mg total) by mouth daily.    [name] atorvastatin (LIPITOR) 20 MG tablet  Take 20 mg by mouth every evening.    . Cholecalciferol (VITAMIN D-3) 1000 UNITS CAPS Take 5,000 Units by mouth daily.     [name] dexlansoprazole (DEXILANT) 60 MG capsule Take 60 mg by mouth daily.    [name] ELIQUIS 2.5 MG TABS tablet TAKE ONE TABLET BY MOUTH TWICE A DAY 60 tablet 5  . levothyroxine (SYNTHROID, LEVOTHROID) 100 MCG tablet Take 100 mcg by mouth daily before breakfast.    . Magnesium Oxide 200 MG TABS Take 200 mg by mouth 2 (two) times daily.    . meclizine (ANTIVERT) 12.5 MG tablet Take 12.5 mg by mouth 3 (three) times daily as needed for dizziness.    . metoprolol succinate (TOPROL-XL) 25 MG 24 hr tablet Take 25 mg by mouth daily.    . nitroGLYCERIN (NITROSTAT) 0.4 MG SL  tablet Place 0.4 mg under the tongue every 5 (five) minutes as needed for chest pain (up to 3 doses).    . ondansetron (ZOFRAN) 4 MG tablet TAKE ONE TABLET BY MOUTH TWICE A DAY AS NEEDED FOR NAUSEA OR VOMITING 30 tablet 0  . pregabalin (LYRICA) 50 MG capsule Take 50 mg by mouth 2 (two) times daily.    . psyllium (REGULOID) 0.52 G capsule Take 0.52 g by mouth 2 (two) times daily.     . Simethicone (PHAZYME) 180 MG CAPS Take 1 capsule (180 mg total) by mouth 3 (three) times daily as needed.  0  . Sodium Polystyrene Sulfonate (KAYEXALATE) POWD Take 30g  By mouth daily for 2 days 60 g 0  . SYMBICORT 160-4.5 MCG/ACT inhaler Inhale 1 puff into the lungs 2 (two) times daily.    [name] torsemide (DEMADEX) 20 MG tablet Take 1 tablet (20 mg total) by mouth daily. As needed for swelling 90 tablet 3  . traMADol (ULTRAM) 50 MG tablet Take 1 tablet by mouth 2 (two) times daily.     No current facility-administered medications for this visit.      Past Surgical History:  Procedure Laterality Date  . ABDOMINAL HYSTERECTOMY    . BREAST CYST EXCISION Right 1970's  . BUNIONECTOMY Bilateral   . CARDIAC CATHETERIZATION  05/11/2013  . CARPAL TUNNEL RELEASE Right   . CATARACT EXTRACTION W/ INTRAOCULAR LENS  IMPLANT, BILATERAL    . CHOLECYSTECTOMY    . CORONARY ANGIOPLASTY WITH STENT PLACEMENT  06/2012   DES proximal LAD [name] 05/11/2013  . ESOPHAGOGASTRODUODENOSCOPY N/A 02/14/2016   Procedure: ESOPHAGOGASTRODUODENOSCOPY (EGD);  Surgeon: Malissa Hippo, MD;  Location: [facility] ENDO SUITE;  Service: Endoscopy;  Laterality: N/A;  1:40  . HAMMER TOE SURGERY Bilateral   . LEFT HEART CATHETERIZATION WITH CORONARY ANGIOGRAM N/A 07/23/2012   Procedure: LEFT HEART CATHETERIZATION WITH CORONARY ANGIOGRAM;  Surgeon: Kathleene Hazel, MD;  Location: [facility] CATH LAB;  Service: Cardiovascular;  Laterality: N/A;  . LEFT HEART CATHETERIZATION WITH CORONARY ANGIOGRAM N/A 05/11/2013   Procedure: LEFT HEART CATHETERIZATION WITH CORONARY  ANGIOGRAM;  Surgeon: Kathleene Hazel, MD;  Location: [facility] CATH LAB;  Service: Cardiovascular;  Laterality: N/A;  . PERCUTANEOUS CORONARY STENT INTERVENTION (PCI-S)  07/23/2012   Procedure: PERCUTANEOUS CORONARY STENT INTERVENTION (PCI-S);  Surgeon: Kathleene Hazel, MD;  Location: [facility] CATH LAB;  Service: Cardiovascular;;  . TOE SURGERY     "don't really remember which toe or which side; it was real crooked and had to be straightened" (05/11/2013)  . TOTAL KNEE ARTHROPLASTY Right 2000     Allergies  Allergen Reactions  . Morphine And Related Nausea Only  .  Sulfa Antibiotics     unknown  . Sulfa Drugs Cross Reactors     unknown      Family History  Problem Relation Age of Onset  . Breast cancer Mother   . Heart disease Father      Social History Ms. Lycan reports that she has never smoked. She has never used smokeless tobacco. Ms. Rausch reports that she does not drink alcohol.   Review of Systems CONSTITUTIONAL: No weight loss, fever, chills, weakness or fatigue.  HEENT: Eyes: No visual loss, blurred vision, double vision or yellow sclerae.No hearing loss, sneezing, congestion, runny nose or sore throat.  SKIN: No rash or itching.  CARDIOVASCULAR: per hpi RESPIRATORY: No shortness of breath, cough or sputum.  GASTROINTESTINAL: No anorexia, nausea, vomiting or diarrhea. No abdominal pain or blood.  GENITOURINARY: No burning on urination, no polyuria NEUROLOGICAL: No headache, dizziness, syncope, paralysis, ataxia, numbness or tingling in the extremities. No change in bowel or bladder control.  MUSCULOSKELETAL: No muscle, back pain, joint pain or stiffness.  LYMPHATICS: No enlarged nodes. No history of splenectomy.  PSYCHIATRIC: No history of depression or anxiety.  ENDOCRINOLOGIC: No reports of sweating, cold or heat intolerance. No polyuria or polydipsia.  [name]   Physical Examination Vitals:   03/17/17 1502  BP: (!) 163/77  Pulse: 62   Vitals:   03/17/17 1502   Weight: 156 lb 9.6 oz (71 kg)  Height: 5' (1.524 m)    Gen: resting comfortably, no acute distress HEENT: no scleral icterus, pupils equal round and reactive, no palptable cervical adenopathy,  ZO:XWRUE, no m/r/g, no jvd Resp: Clear to auscultation bilaterally GI: abdomen is soft, non-tender, non-distended, normal bowel sounds, no hepatosplenomegaly MSK: extremities are warm, no edema.  Skin: warm, no rash Neuro:  no focal deficits Psych: appropriate affect   Diagnostic Studies 04/2013 Cath Hemodynamic Findings: Central aortic pressure: 161/60  Left ventricular pressure: 159/8/13  Angiographic Findings:  Left main: No obstructive disease.  Left Anterior Descending Artery: Large caliber vessel that courses to the apex. The proximal vessel has 20% stenosis. The mid vessel has a patent stented segment with no restenosis noted. The diagonal is patent. The mid to distal vessel has a focal 50% smooth stenosis which does not appear to be flow limiting, unchanged in appearance from cath in 2013.  Circumflex Artery: Moderate sized vessel with proximal plaque, 40% smooth stenosis in the mid vessel. The first OM is small and patent. The AV groove Circumflex terminates into a moderate caliber second OM Yee Joss.  Right Coronary Artery: Large, dominant vessel with 30% mid stenosis.  Left Ventricular Angiogram: Deferred.  Impression:  1. Stable single vessel CAD with patent stent proximal to mid LAD  2. Mild to moderate non-obstructive disease in the distal LAD, distal Circumflex and mid RCA  Recommendations: Continue medical management. Her d-dimer is slightly elevated but [facility] at [facility] without evidence of PE. Will monitor overnight.   04/2013 Echo LVEF 40-45%, grade I diastolic dysfunction,   06/2015 echo Study Conclusions  - Left ventricle: The cavity size was normal. There was mild concentric hypertrophy. Systolic function was mildly to moderately reduced.  The estimated ejection fraction was in the range of 40% to 45%. Diffuse hypokinesis. Doppler parameters are consistent with abnormal left ventricular relaxation (grade 1 diastolic dysfunction). Elevated filling pressures. - Ventricular septum: Septal motion showed abnormal function and dyssynergy. These changes are consistent with intraventricular conduction delay. - Aortic valve: There was trivial regurgitation. - Mitral valve: Mildly thickened leaflets .  There was mild regurgitation. - Left atrium: The atrium was mildly dilated. - Tricuspid valve: There was mild regurgitation. - Pulmonary arteries: PA peak pressure: 38 mm Hg (S). Mildly elevated pulmonary pressures.  06/2015 Lexiscan MPI  LBBB present throughout study.  There is a small defect of mild severity present in the mid anteroseptal, apical anterior and apical septal location. The defect is non-reversible. Consistent with scar versus soft tissue attenuation. No definite ischemia.  This is a low risk study.  Nuclear stress EF: 53%. Mild global hypokinesis      Assessment and Plan  1. Afib -no recent symptoms.  - continue eliquis for stroke prevention, her CHADS2Vasc score is 6.  - workup for her SOB as described below, pending results may need to consider coming off amio  2. CAD/ICM/Chronic systolic HF/SOB - recent SOB ongoing since pneumonia in January. Does not appear volume overlaoded by exam - obtain PA/lateral CXR   3. HTN - elevated in clinic, recently at goal at her pcp visit - we will continue to monitor at this time. She will submit bp log in 1 week  4. Leg weakness - refer to PT, unsteady gait    F/u pending test results.           Antoine Poche, M.D.

## 2017-03-20 ENCOUNTER — Ambulatory Visit (HOSPITAL_COMMUNITY)
Admission: RE | Admit: 2017-03-20 | Discharge: 2017-03-20 | Disposition: A | Discharge status: Home / Self Care | Payer: Medicare Other | Source: Ambulatory Visit | Attending: Cardiology | Admitting: Cardiology

## 2017-03-20 ENCOUNTER — Other Ambulatory Visit (HOSPITAL_COMMUNITY)
Admission: RE | Admit: 2017-03-20 | Discharge: 2017-03-20 | Disposition: A | Discharge status: Home / Self Care | Payer: Medicare Other | Source: Ambulatory Visit | Attending: Cardiology | Admitting: Cardiology

## 2017-03-20 DIAGNOSIS — R0602 Shortness of breath: Secondary | ICD-10-CM | POA: Insufficient documentation

## 2017-03-20 DIAGNOSIS — I5030 Unspecified diastolic (congestive) heart failure: Secondary | ICD-10-CM | POA: Insufficient documentation

## 2017-03-20 DIAGNOSIS — J9811 Atelectasis: Secondary | ICD-10-CM | POA: Insufficient documentation

## 2017-03-20 DIAGNOSIS — R0902 Hypoxemia: Secondary | ICD-10-CM | POA: Diagnosis present

## 2017-03-20 DIAGNOSIS — I700 Atherosclerosis of aorta: Secondary | ICD-10-CM | POA: Insufficient documentation

## 2017-03-20 LAB — COMPREHENSIVE METABOLIC PANEL
ALT: 25 U/L (ref 14–54)
ANION GAP: 9 (ref 5–15)
AST: 26 U/L (ref 15–41)
Albumin: 3.9 g/dL (ref 3.5–5.0)
Alkaline Phosphatase: 88 U/L (ref 38–126)
BILIRUBIN TOTAL: 1 mg/dL (ref 0.3–1.2)
BUN: 31 mg/dL — ABNORMAL HIGH (ref 6–20)
CALCIUM: 9 mg/dL (ref 8.9–10.3)
CO2: 32 mmol/L (ref 22–32)
Chloride: 96 mmol/L — ABNORMAL LOW (ref 101–111)
Creatinine, Ser: 1.61 mg/dL — ABNORMAL HIGH (ref 0.44–1.00)
GFR calc Af Amer: 32 mL/min — ABNORMAL LOW (ref >60)
GFR, EST NON AFRICAN AMERICAN: 27 mL/min — AB (ref >60)
Glucose, Bld: 99 mg/dL (ref 65–99)
POTASSIUM: 4.5 mmol/L (ref 3.5–5.1)
Sodium: 137 mmol/L (ref 135–145)
TOTAL PROTEIN: 6.7 g/dL (ref 6.5–8.1)

## 2017-03-20 LAB — CBC
HCT: 38.8 % (ref 36.0–46.0)
Hemoglobin: 12.7 g/dL (ref 12.0–15.0)
MCH: 30.2 pg (ref 26.0–34.0)
MCHC: 32.7 g/dL (ref 30.0–36.0)
MCV: 92.4 fL (ref 78.0–100.0)
PLATELETS: 195 10*3/uL (ref 150–400)
RBC: 4.2 MIL/uL (ref 3.87–5.11)
RDW: 15.2 % (ref 11.5–15.5)
WBC: 8.4 10*3/uL (ref 4.0–10.5)

## 2017-03-20 LAB — TSH: TSH: 3.067 u[IU]/mL (ref 0.350–4.500)

## 2017-03-20 LAB — BRAIN NATRIURETIC PEPTIDE: B Natriuretic Peptide: 106 pg/mL — ABNORMAL HIGH (ref 0.0–100.0)

## 2017-03-24 ENCOUNTER — Telehealth: Payer: Self-pay

## 2017-03-24 ENCOUNTER — Other Ambulatory Visit: Payer: Self-pay | Admitting: Cardiology

## 2017-03-24 DIAGNOSIS — I5030 Unspecified diastolic (congestive) heart failure: Secondary | ICD-10-CM

## 2017-03-24 NOTE — Telephone Encounter (Signed)
-----   Message from Antoine Poche, MD sent at 03/21/2017  1:59 PM EDT ----- Labs look fine  Dominga Ferry MD

## 2017-03-24 NOTE — Telephone Encounter (Signed)
Patient notified that apt will be scheduled tomorrow

## 2017-03-24 NOTE — Telephone Encounter (Signed)
-----   Message from Antoine Poche, MD sent at 03/21/2017  1:58 PM EDT ----- CXR looks ok. Id like to repeat her breathing tests to make sure there is no change. One of the medications she is on amiodarone, can sometimes cause lung damage and this test will help evaluate. Please order PFTs at [facility] MD

## 2017-03-24 NOTE — Telephone Encounter (Signed)
Spoke with patients son. Son verbalized understanding. Routing results to PCP

## 2017-03-24 NOTE — Telephone Encounter (Signed)
Patients son notified. Routed to PCP  

## 2017-03-25 ENCOUNTER — Telehealth: Payer: Self-pay | Admitting: Cardiology

## 2017-03-25 NOTE — Telephone Encounter (Signed)
Schedule at [facility]    PFT  May 4 at 11:00. Number given to patient to reschedule if not a good day for her.

## 2017-03-28 ENCOUNTER — Telehealth: Payer: Self-pay | Admitting: *Deleted

## 2017-03-28 ENCOUNTER — Ambulatory Visit (HOSPITAL_COMMUNITY): Payer: Medicare Other | Attending: Cardiology

## 2017-03-28 NOTE — Telephone Encounter (Signed)
Pt voiced understanding - update medication list   

## 2017-03-28 NOTE — Telephone Encounter (Signed)
-----   Message from Antoine PocheJonathan F Branch, MD sent at 03/27/2017  1:32 PM EDT ----- BP log reviewed, bp's too high. Have her increase norvasc back to 5mg  daily   Dominga FerryJ Branch MD

## 2017-03-31 ENCOUNTER — Other Ambulatory Visit: Payer: Self-pay | Admitting: *Deleted

## 2017-03-31 MED ORDER — METOPROLOL SUCCINATE ER 25 MG PO TB24: ORAL_TABLET | ORAL | 3 refills | Status: DC

## 2017-04-02 ENCOUNTER — Telehealth: Payer: Self-pay | Admitting: Cardiology

## 2017-04-02 ENCOUNTER — Encounter (HOSPITAL_COMMUNITY): Payer: Self-pay

## 2017-04-02 ENCOUNTER — Ambulatory Visit (HOSPITAL_COMMUNITY): Payer: Medicare Other | Attending: Cardiology

## 2017-04-02 DIAGNOSIS — R262 Difficulty in walking, not elsewhere classified: Secondary | ICD-10-CM | POA: Insufficient documentation

## 2017-04-02 DIAGNOSIS — M6281 Muscle weakness (generalized): Secondary | ICD-10-CM | POA: Diagnosis present

## 2017-04-02 DIAGNOSIS — R29898 Other symptoms and signs involving the musculoskeletal system: Secondary | ICD-10-CM

## 2017-04-02 DIAGNOSIS — R2681 Unsteadiness on feet: Secondary | ICD-10-CM | POA: Diagnosis not present

## 2017-04-02 NOTE — Patient Instructions (Signed)
  BRIDGING  While lying on your back, tighten your lower abdominals, squeeze your buttocks and then raise your buttocks off the floor/bed as creating a "Bridge" with your body. Hold and then lower yourself and repeat.  Perform 1x/day, 2-3 sets of 10 reps   SUPINE HIP ABDUCTION - ELASTIC BAND CLAMS  Lie down on your back with your knees bent. Place an elastic band around your knees and then draw your knees apart.  Perform 1x/day, 2-3 sets of 10 reps each with the red band   SIT TO STAND - THIGH SUPPORT  Start by scooting close to the front of the chair.  Then lean forward and place your hands on your thighs. Rise up to standing using your hands for support.   Sit back down using your hands for support on your thighs and then repeat.   Perform 1x/day, 2-3 sets of 10 reps, keep rollator in front for steadiness

## 2017-04-02 NOTE — Therapy (Signed)
[facility] [address] [place], [place], [address] Phone: [phone]   Fax:  [phone]  Physical Therapy Evaluation  Patient Details  Name: Ana Pena MRN: [id number] Date of Birth: 01/15/1926 Referring Provider: Antoine PocheJonathan F Branch, MD  Encounter Date: 04/02/2017      PT End of Session - 04/02/17 1501    Visit Number 1   Number of Visits 12   Date for PT Re-Evaluation 04/23/17   Authorization Type Medicare Part A and B   Authorization Time Period 04/02/17 to 05/14/17   Authorization - Visit Number 1   Authorization - Number of Visits 10   PT Start Time 1400   PT Stop Time 1445   PT Time Calculation (min) 45 min   Equipment Utilized During Treatment Gait belt   Activity Tolerance Patient tolerated treatment well;No increased pain   Behavior During Therapy WFL for tasks assessed/performed      Past Medical History:  Diagnosis Date  . Abnormal CT scan    a. [facility] RUQ [facility] b. Redemonstrated 04/2013, pt instructed to f/u PCP.  [name]. Arthritis    "all over" (05/11/2013)  . Asthma   . CAD (coronary artery disease)    a. s/p PTCA/DES to LAD 07/23/12. b. Mildly elevated trop 04/2013 with stable cath, no culprit.  . Chronic lower back pain   . Chronic venous insufficiency   . CKD (chronic kidney disease)   . Diastolic CHF (HCC)    a. Prior normal EF 06/2013. b. EF 40-45% by echo 04/2013.  . Diverticulosis   . GERD (gastroesophageal reflux disease)   . H/O hiatal hernia   . Hypercholesteremia   . Hypertension   . Hypothyroidism   . Iron deficiency anemia   . LBBB (left bundle branch block)     Past Surgical History:  Procedure Laterality Date  . ABDOMINAL HYSTERECTOMY    . BREAST CYST EXCISION Right 1970's  . BUNIONECTOMY Bilateral   . CARDIAC CATHETERIZATION  05/11/2013  . CARPAL TUNNEL RELEASE Right   . CATARACT EXTRACTION W/ INTRAOCULAR LENS  IMPLANT, BILATERAL    .  CHOLECYSTECTOMY    . CORONARY ANGIOPLASTY WITH STENT PLACEMENT  06/2012   DES proximal LAD [name]/notes 05/11/2013  . ESOPHAGOGASTRODUODENOSCOPY N/A 02/14/2016   Procedure: ESOPHAGOGASTRODUODENOSCOPY (EGD);  Surgeon: Malissa HippoNajeeb U Rehman, MD;  [facility];  Service: Endoscopy;  Laterality: N/A;  1:40  . HAMMER TOE SURGERY Bilateral   . LEFT HEART CATHETERIZATION WITH CORONARY ANGIOGRAM N/A 07/23/2012   Procedure: LEFT HEART CATHETERIZATION WITH CORONARY ANGIOGRAM;  Surgeon: Kathleene Hazelhristopher D McAlhany, MD;  [facility];  Service: Cardiovascular;  Laterality: N/A;  . LEFT HEART CATHETERIZATION WITH CORONARY ANGIOGRAM N/A 05/11/2013   Procedure: LEFT HEART CATHETERIZATION WITH CORONARY ANGIOGRAM;  Surgeon: Kathleene Hazelhristopher D McAlhany, MD;  [facility];  Service: Cardiovascular;  Laterality: N/A;  . PERCUTANEOUS CORONARY STENT INTERVENTION (PCI-S)  07/23/2012   Procedure: PERCUTANEOUS CORONARY STENT INTERVENTION (PCI-S);  Surgeon: Kathleene Hazelhristopher D McAlhany, MD;  [facility];  Service: Cardiovascular;;  . TOE SURGERY     "don't really remember which toe or which side; it was real crooked and had to be straightened" (05/11/2013)  . TOTAL KNEE ARTHROPLASTY Right 2000    There were no vitals filed for this visit.       Subjective Assessment - 04/02/17 1404    Subjective Pt states that her legs are really  weak. This started 6-8 months ago. She has had 3 falls in the last 6 months. Her last fall was last night when she was trying to pick something up and her legs gave out. She typically falls backwards. She has intermittent dizziness with turning too fast. She states that walking and picking up items of the floor is the hardest thing for her to do. She lives with her daughter.  She denies any back pain or radicular symptoms. She is currently using a rollator but was using a SPC prior to her increasing weakness.   Limitations Lifting;Walking   How long can you sit comfortably? no issues    How long can you stand comfortably? not very long, due to weakess, might can do 5   How long can you walk comfortably? less than 5 mins and then has to sit due to weakness   Patient Stated Goals walk better with a cane and get stronger   Currently in Pain? No/denies            [facility] PT Assessment - 04/02/17 0001      Assessment   Medical Diagnosis weakness of both extremities   Referring Provider Antoine Poche, MD   Onset Date/Surgical Date --  "6-8 months ago"   Next MD Visit n/a   Prior Therapy none for present issue     Precautions   Precautions Fall     Balance Screen   Has the patient fallen in the past 6 months Yes   How many times? 3   Has the patient had a decrease in activity level because of a fear of falling?  Yes   Is the patient reluctant to leave their home because of a fear of falling?  No     Prior Function   Level of Independence Independent with basic ADLs   Leisure play cards, read, play games on tablet, go to church     Cognition   Overall Cognitive Status Within Functional Limits for tasks assessed     Observation/Other Assessments   Observations pt demo'd increased posterior sway throughout all balance testing; ambulated into clinic with rollator too high so adjusted it accordingly   Focus on Therapeutic Outcomes (FOTO)  64     ROM / Strength   AROM / PROM / Strength Strength     Strength   Right Hip Flexion 4-/5   Right Hip Extension 2+/5   Right Hip ABduction 2+/5   Left Hip Flexion 4-/5   Left Hip Extension 2+/5   Left Hip ABduction 2+/5   Right Knee Flexion 4+/5   Right Knee Extension 5/5   Left Knee Flexion 4/5   Left Knee Extension 4+/5   Right Ankle Dorsiflexion 4+/5   Left Ankle Dorsiflexion 4/5     Ambulation/Gait   Assistive device Rollator   Gait Pattern Step-through pattern;Decreased arm swing - right;Decreased arm swing - left;Decreased stride length;Decreased dorsiflexion - right;Decreased dorsiflexion - left  decr  hip ext throughout   Gait velocity slow     Balance   Balance Assessed Yes     Static Standing Balance   Static Standing - Balance Support No upper extremity supported   Static Standing Balance -  Activities  Single Leg Stance - Right Leg;Single Leg Stance - Left Leg   Static Standing - Comment/# of Minutes R: <5 seconds L: <5 seconds     Standardized Balance Assessment   Standardized Balance Assessment Berg Balance Test;Five Times Sit to Stand;Timed Up  and Go Test   Five times sit to stand comments  27 seconds from chair with BUE support     Berg Balance Test   Sit to Stand Able to stand  independently using hands   Standing Unsupported Able to stand 2 minutes with supervision   Sitting with Back Unsupported but Feet Supported on Floor or Stool Able to sit 2 minutes under supervision   Stand to Sit Controls descent by using hands   Transfers Able to transfer safely, definite need of hands   Standing Unsupported with Eyes Closed Able to stand 10 seconds with supervision   Standing Ubsupported with Feet Together Needs help to attain position but able to stand for 30 seconds with feet together   From Standing, Reach Forward with Outstretched Arm Can reach forward >5 cm safely (2")   From Standing Position, Pick up Object from Floor Unable to pick up shoe, but reaches 2-5 cm (1-2") from shoe and balances independently   From Standing Position, Turn to Look Behind Over each Shoulder Looks behind from both sides and weight shifts well   Turn 360 Degrees Able to turn 360 degrees safely but slowly   Standing Unsupported, Alternately Place Feet on Step/Stool Able to stand independently and complete 8 steps >20 seconds   Standing Unsupported, One Foot in Front Able to plae foot ahead of the other independently and hold 30 seconds   Standing on One Leg Able to lift leg independently and hold equal to or more than 3 seconds   Total Score 37     Timed Up and Go Test   Normal TUG (seconds) 31   with rollator                           PT Education - 04/02/17 1501    Education provided Yes   Education Details exam findings, POC, HEP   Person(s) Educated Patient;Child(ren)   Methods Explanation;Demonstration;Handout   Comprehension Verbalized understanding;Returned demonstration          PT Short Term Goals - 04/02/17 1517      PT SHORT TERM GOAL #1   Title Pt will be independent with HEP and perform consistently to maximize overall function and decrease risk of falls.   Time 3   Period Weeks   Status New     PT SHORT TERM GOAL #2   Title Pt will have improved TUG time with LRAD to 15 seconds or < to demonstrate improved overall functional strength and balance to maximize function in the community.   Time 3   Period Weeks   Status New     PT SHORT TERM GOAL #3   Title Pt will have improved bilateral SLS to at least 10 seconds with no UE support to demonstrate improved overall balance in order to maximize gait and decrease risk for falls   Time 3   Period Weeks   Status New           PT Long Term Goals - 04/02/17 1523      PT LONG TERM GOAL #1   Title Pt will have improved BERG balance score to at least 45/56 to demonstrate improved overall balance and decrease risk for falls.   Time 6   Period Weeks   Status New     PT LONG TERM GOAL #2   Title Pt will have improved 5xSTS to at least 15 seconds or less with no UE support  from chair to demonstrate improved overall functional strength and decrease risk of falls.   Time 6   Period Weeks   Status New     PT LONG TERM GOAL #3   Title Pt will have improved overall BLE MMT to at least 4+/5 to maximize gait and decrease risk for falls.   Time 6   Period Weeks   Status New     PT LONG TERM GOAL #4   Title Pt will report being able to shop for at least 1 hour with her family and require 3 rest breaks or < to demonstrate improved overall strength and maximize participation in the  community.   Time 6   Period Weeks   Status New               Plan - 2017/04/09 1503    Clinical Impression Statement Pt is pleasant 83 YO F who presents to OPPT with c/o BLE weakness which has gradually worsened over the last 6-8 months. She has deficits in BLE strength, especially of proximal musculature, deficits in functional strength as demo by 5xSTS, deficits in gait, balance, and overall function. Pt scored a 37/56 on the BERG balance test and it took her 31 seconds with her rollator to perform the TUG, indicating she is at significant risk for falls. Pt currently ambulating with a rollator but wishes to return to using her SPC. Pt needs skilled PT intervention to maximize overall strength and balance in order to improve overall function and QoL.   Rehab Potential Fair   PT Frequency 2x / week   PT Duration 6 weeks   PT Treatment/Interventions ADLs/Self Care Home Management;DME Instruction;Gait training;Stair training;Functional mobility training;Therapeutic activities;Therapeutic exercise;Balance training;Neuromuscular re-education;Patient/family education;Manual techniques;Passive range of motion;Energy conservation;Vestibular   PT Next Visit Plan review goals and HEP, heel and toe raises, standing march, standing hip abd, side stepping, rockerboard, SLS, semi tandem stance with cone rotation   PT Home Exercise Plan eval: bridging, supine clamshells, sit <> stands   Consulted and Agree with Plan of Care Patient;Family member/caregiver   Family Member Consulted dtr      Patient will benefit from skilled therapeutic intervention in order to improve the following deficits and impairments:  Abnormal gait, Decreased activity tolerance, Decreased balance, Decreased endurance, Decreased mobility, Decreased strength, Difficulty walking, Dizziness  Visit Diagnosis: Unsteadiness on feet - Plan: PT plan of care cert/re-cert  Muscle weakness (generalized) - Plan: PT plan of care  cert/re-cert  Difficulty in walking, not elsewhere classified - Plan: PT plan of care cert/re-cert  Other symptoms and signs involving the musculoskeletal system - Plan: PT plan of care cert/re-cert      G-Codes - Apr 09, 2017 1529    Functional Assessment Tool Used (Outpatient Only) 5xSTS, MMT, TUG, BERG, clinical judgement   Functional Limitation Mobility: Walking and moving around   Mobility: Walking and Moving Around Current Status ([id number]) At least 40 percent but less than 60 percent impaired, limited or restricted   Mobility: Walking and Moving Around Goal Status [phone]) At least 1 percent but less than 20 percent impaired, limited or restricted       Problem List Patient Active Problem List   Diagnosis Date Noted  . Cardiomyopathy, ischemic 05/13/2013  . HLD (hyperlipidemia) 09/10/2012  . CAD  s/p LAD stenting 8/13   . Diastolic CHF (HCC)   . Hypertension      Jac Canavan PT, DPT    [facility] [address]  [place], [place], [address] Phone: [phone]   Fax:  [phone]  Name: Ana Pena MRN: [id number] Date of Birth: [birth year]

## 2017-04-02 NOTE — Telephone Encounter (Signed)
[facility] - she was a NS last time. Thank you  Per Dr. Wyline MoodBranch. Called and left message for patient to call the office.

## 2017-04-08 NOTE — Telephone Encounter (Signed)
Looks like PFT's were scheduled for 5/18

## 2017-04-09 ENCOUNTER — Ambulatory Visit (HOSPITAL_COMMUNITY): Payer: Medicare Other

## 2017-04-09 DIAGNOSIS — R2681 Unsteadiness on feet: Secondary | ICD-10-CM

## 2017-04-09 DIAGNOSIS — R262 Difficulty in walking, not elsewhere classified: Secondary | ICD-10-CM

## 2017-04-09 DIAGNOSIS — R29898 Other symptoms and signs involving the musculoskeletal system: Secondary | ICD-10-CM

## 2017-04-09 DIAGNOSIS — M6281 Muscle weakness (generalized): Secondary | ICD-10-CM

## 2017-04-09 NOTE — Therapy (Signed)
[facility] [address] [place], [place], [address] Phone: [phone]   Fax:  [phone]  Physical Therapy Treatment  Patient Details  Name: Ana Pena MRN: [id number] Date of Birth: 24-Sep-1926 Referring Provider: Antoine Poche, MD  Encounter Date: 04/09/2017      PT End of Session - 04/09/17 1348    Visit Number 2   Number of Visits 12   Date for PT Re-Evaluation 04/23/17   Authorization Type Medicare Part A and B   Authorization Time Period 04/02/17 to 05/14/17   Authorization - Visit Number 2   Authorization - Number of Visits 10   PT Start Time 1345   PT Stop Time 1427   PT Time Calculation (min) 42 min   Equipment Utilized During Treatment Gait belt   Activity Tolerance Patient limited by fatigue;No increased pain;Patient tolerated treatment well   Behavior During Therapy [facility] for tasks assessed/performed      Past Medical History:  Diagnosis Date  . Abnormal CT scan    a. Question of possible renal cyst in RUQ by CT scan 06/2012 at [facility]. b. Redemonstrated 04/2013, pt instructed to f/u PCP.  [name] Arthritis    "all over" (05/11/2013)  . Asthma   . CAD (coronary artery disease)    a. s/p PTCA/DES to LAD 07/23/12. b. Mildly elevated trop 04/2013 with stable cath, no culprit.  . Chronic lower back pain   . Chronic venous insufficiency   . CKD (chronic kidney disease)   . Diastolic CHF (HCC)    a. Prior normal EF 06/2013. b. EF 40-45% by echo 04/2013.  . Diverticulosis   . GERD (gastroesophageal reflux disease)   . H/O hiatal hernia   . Hypercholesteremia   . Hypertension   . Hypothyroidism   . Iron deficiency anemia   . LBBB (left bundle branch block)     Past Surgical History:  Procedure Laterality Date  . ABDOMINAL HYSTERECTOMY    . BREAST CYST EXCISION Right 1970's  . BUNIONECTOMY Bilateral   . CARDIAC CATHETERIZATION  05/11/2013  . CARPAL TUNNEL RELEASE Right   . CATARACT EXTRACTION W/ INTRAOCULAR LENS   IMPLANT, BILATERAL    . CHOLECYSTECTOMY    . CORONARY ANGIOPLASTY WITH STENT PLACEMENT  06/2012   DES proximal LAD [name] 05/11/2013  . ESOPHAGOGASTRODUODENOSCOPY N/A 02/14/2016   Procedure: ESOPHAGOGASTRODUODENOSCOPY (EGD);  Surgeon: Malissa Hippo, MD;  [facility];  Service: Endoscopy;  Laterality: N/A;  1:40  . HAMMER TOE SURGERY Bilateral   . LEFT HEART CATHETERIZATION WITH CORONARY ANGIOGRAM N/A 07/23/2012   Procedure: LEFT HEART CATHETERIZATION WITH CORONARY ANGIOGRAM;  Surgeon: Kathleene Hazel, MD;  Location: [facility] CATH LAB;  Service: Cardiovascular;  Laterality: N/A;  . LEFT HEART CATHETERIZATION WITH CORONARY ANGIOGRAM N/A 05/11/2013   Procedure: LEFT HEART CATHETERIZATION WITH CORONARY ANGIOGRAM;  Surgeon: Kathleene Hazel, MD;  Location: [facility] CATH LAB;  Service: Cardiovascular;  Laterality: N/A;  . PERCUTANEOUS CORONARY STENT INTERVENTION (PCI-S)  07/23/2012   Procedure: PERCUTANEOUS CORONARY STENT INTERVENTION (PCI-S);  Surgeon: Kathleene Hazel, MD;  Location: [facility] CATH LAB;  Service: Cardiovascular;;  . TOE SURGERY     "don't really remember which toe or which side; it was real crooked and had to be straightened" (05/11/2013)  . TOTAL KNEE ARTHROPLASTY Right 2000    There were no vitals filed for this visit.      Subjective Assessment - 04/09/17 1342    Subjective No reoprts of recent falls  or pain currently.  Reports compliance with HEP daily with some difficulty with sit to stand.     Patient Stated Goals walk better with a cane and get stronger   Currently in Pain? No/denies                         [facility] Adult PT Treatment/Exercise - 04/09/17 0001      Exercises   Exercises Knee/Hip     Knee/Hip Exercises: Standing   Heel Raises 10 reps   Heel Raises Limitations heel and toe raises 10x   Knee Flexion 10 reps   Knee Flexion Limitations marching with cueing to reduce HHA   Hip Abduction Both   Rocker Board 1 minute   Rocker  Board Limitations R./L   SLS next session   Other Standing Knee Exercises sidestep 3RT inside // bars   Other Standing Knee Exercises Tandem stance begin next session     Knee/Hip Exercises: Seated   Sit to Sand 2 sets;without UE support;5 reps  no HHA, elevated height and instructed forward lean to assis     Knee/Hip Exercises: Supine   Bridges 2 sets;10 reps   Bridges Limitations reviewed HEP   Other Supine Knee/Hip Exercises clam BLE 2x 10                 PT Education - 04/09/17 1504    Education provided Yes   Education Details Reviewed goals, assured complaince and proper form/technique with HEP and copy of eval given to pt/daughter.  Instructed techniques to assist with sit to stands   Person(s) Educated Patient;Child(ren)   Methods Explanation;Demonstration;Handout   Comprehension Verbalized understanding;Returned demonstration;Need further instruction          PT Short Term Goals - 04/02/17 1517      PT SHORT TERM GOAL #1   Title Pt will be independent with HEP and perform consistently to maximize overall function and decrease risk of falls.   Time 3   Period Weeks   Status New     PT SHORT TERM GOAL #2   Title Pt will have improved TUG time with LRAD to 15 seconds or < to demonstrate improved overall functional strength and balance to maximize function in the community.   Time 3   Period Weeks   Status New     PT SHORT TERM GOAL #3   Title Pt will have improved bilateral SLS to at least 10 seconds with no UE support to demonstrate improved overall balance in order to maximize gait and decrease risk for falls   Time 3   Period Weeks   Status New           PT Long Term Goals - 04/02/17 1523      PT LONG TERM GOAL #1   Title Pt will have improved BERG balance score to at least 45/56 to demonstrate improved overall balance and decrease risk for falls.   Time 6   Period Weeks   Status New     PT LONG TERM GOAL #2   Title Pt will have improved  5xSTS to at least 15 seconds or less with no UE support from chair to demonstrate improved overall functional strength and decrease risk of falls.   Time 6   Period Weeks   Status New     PT LONG TERM GOAL #3   Title Pt will have improved overall BLE MMT to at least 4+/5 to maximize gait  and decrease risk for falls.   Time 6   Period Weeks   Status New     PT LONG TERM GOAL #4   Title Pt will report being able to shop for at least 1 hour with her family and require 3 rest breaks or < to demonstrate improved overall strength and maximize participation in the community.   Time 6   Period Weeks   Status New               Plan - 04/09/17 1356    Clinical Impression Statement Reviewed goals, assured compliance and proper form/technique wiht HEP and copy of eval given to pt and daugther present during session.  Pt able to verbalize and demonstrate appropriate form with HEP but has lots of difficulty with STS exercise.  Pt instructed techniques to increase ease with STS including elevated height, scooting forward to EOS and forward lean, pt able to complete independently following cues.  Progressed to [facility] for LE strengthening and balance training, pt able to demonstrate appropriate form/tech with min cueing.  Pt was limited by fatigue with task, no reports of pain through session.     Rehab Potential Fair   PT Frequency 2x / week   PT Duration 6 weeks   PT Treatment/Interventions ADLs/Self Care Home Management;DME Instruction;Gait training;Stair training;Functional mobility training;Therapeutic activities;Therapeutic exercise;Balance training;Neuromuscular re-education;Patient/family education;Manual techniques;Passive range of motion;Energy conservation;Vestibular   PT Next Visit Plan Continue with [facility] heel and toe raises, standing march, standing hip abd, side stepping, rockerboard.  Next session begin SLS, semi tandem stance with cone rotation   PT Home Exercise Plan eval: bridging,  supine clamshells, sit <> stands      Patient will benefit from skilled therapeutic intervention in order to improve the following deficits and impairments:  Abnormal gait, Decreased activity tolerance, Decreased balance, Decreased endurance, Decreased mobility, Decreased strength, Difficulty walking, Dizziness  Visit Diagnosis: Unsteadiness on feet  Muscle weakness (generalized)  Difficulty in walking, not elsewhere classified  Other symptoms and signs involving the musculoskeletal system     Problem List Patient Active Problem List   Diagnosis Date Noted  . Cardiomyopathy, ischemic 05/13/2013  . HLD (hyperlipidemia) 09/10/2012  . CAD  s/p LAD stenting 8/13   . Diastolic CHF (HCC)   . Hypertension    Becky Sax, LPTA; CBIS [phone]  Juel Burrow 04/09/2017, 3:05 PM  [facility] [address] [place], [place], [address] Phone: [phone]   Fax:  [phone]  Name: Ana Pena MRN: [id number] Date of Birth: 1926-06-11

## 2017-04-11 ENCOUNTER — Ambulatory Visit (HOSPITAL_COMMUNITY): Payer: Medicare Other | Admitting: Physical Therapy

## 2017-04-11 ENCOUNTER — Ambulatory Visit (HOSPITAL_COMMUNITY)
Admission: RE | Admit: 2017-04-11 | Discharge: 2017-04-11 | Disposition: A | Discharge status: Home / Self Care | Payer: Medicare Other | Source: Ambulatory Visit | Attending: Cardiology | Admitting: Cardiology

## 2017-04-11 DIAGNOSIS — R262 Difficulty in walking, not elsewhere classified: Secondary | ICD-10-CM

## 2017-04-11 DIAGNOSIS — M6281 Muscle weakness (generalized): Secondary | ICD-10-CM

## 2017-04-11 DIAGNOSIS — R2681 Unsteadiness on feet: Secondary | ICD-10-CM

## 2017-04-11 DIAGNOSIS — I5030 Unspecified diastolic (congestive) heart failure: Secondary | ICD-10-CM | POA: Insufficient documentation

## 2017-04-11 DIAGNOSIS — R29898 Other symptoms and signs involving the musculoskeletal system: Secondary | ICD-10-CM

## 2017-04-11 LAB — PULMONARY FUNCTION TEST
DL/VA % PRED: 74 %
DL/VA: 3.16 ml/min/mmHg/L
DLCO COR: 6.92 ml/min/mmHg
DLCO UNC % PRED: 36 %
DLCO UNC: 6.92 ml/min/mmHg
DLCO cor % pred: 36 %
FEF 25-75 Post: 2.06 L/sec
FEF 25-75 Pre: 1.22 L/sec
FEF2575-%CHANGE-POST: 68 %
FEF2575-%PRED-POST: 278 %
FEF2575-%Pred-Pre: 164 %
FEV1-%CHANGE-POST: 9 %
FEV1-%Pred-Post: 102 %
FEV1-%Pred-Pre: 93 %
FEV1-POST: 1.3 L
FEV1-Pre: 1.19 L
FEV1FVC-%Change-Post: -3 %
FEV1FVC-%PRED-PRE: 120 %
FEV6-%Change-Post: 13 %
FEV6-%Pred-Post: 95 %
FEV6-%Pred-Pre: 84 %
FEV6-PRE: 1.36 L
FEV6-Post: 1.55 L
FEV6FVC-%PRED-PRE: 107 %
FEV6FVC-%Pred-Post: 107 %
FVC-%CHANGE-POST: 13 %
FVC-%PRED-PRE: 77 %
FVC-%Pred-Post: 88 %
FVC-PRE: 1.36 L
FVC-Post: 1.55 L
POST FEV1/FVC RATIO: 84 %
PRE FEV6/FVC RATIO: 100 %
Post FEV6/FVC ratio: 100 %
Pre FEV1/FVC ratio: 87 %
RV % PRED: 59 %
RV: 1.41 L
TLC % pred: 60 %
TLC: 2.7 L

## 2017-04-11 MED ORDER — ALBUTEROL SULFATE (2.5 MG/3ML) 0.083% IN NEBU
2.5000 mg | INHALATION_SOLUTION | Freq: Once | RESPIRATORY_TRACT | Status: AC
  Administered 2017-04-11: 2.5 mg via RESPIRATORY_TRACT

## 2017-04-11 NOTE — Therapy (Signed)
[facility] [address] [place], [place], [address] Phone: [phone]   Fax:  [phone]  Physical Therapy Treatment  Patient Details  Name: Ana Pena MRN: [id number] Date of Birth: 05-04-1926 Referring Provider: Antoine Poche, MD  Encounter Date: 04/11/2017      PT End of Session - 04/11/17 1202    Visit Number 3   Number of Visits 12   Date for PT Re-Evaluation 04/23/17   Authorization Type Medicare Part A and B   Authorization Time Period 04/02/17 to 05/14/17   Authorization - Visit Number 3   Authorization - Number of Visits 10   PT Start Time 1113   PT Stop Time 1156   PT Time Calculation (min) 43 min   Equipment Utilized During Treatment Gait belt   Activity Tolerance Patient limited by fatigue;No increased pain;Patient tolerated treatment well   Behavior During Therapy [facility] for tasks assessed/performed      Past Medical History:  Diagnosis Date  . Abnormal CT scan    a. Question of possible renal cyst in RUQ by CT scan 06/2012 at [facility]. b. Redemonstrated 04/2013, pt instructed to f/u PCP.  [name] Arthritis    "all over" (05/11/2013)  . Asthma   . CAD (coronary artery disease)    a. s/p PTCA/DES to LAD 07/23/12. b. Mildly elevated trop 04/2013 with stable cath, no culprit.  . Chronic lower back pain   . Chronic venous insufficiency   . CKD (chronic kidney disease)   . Diastolic CHF (HCC)    a. Prior normal EF 06/2013. b. EF 40-45% by echo 04/2013.  . Diverticulosis   . GERD (gastroesophageal reflux disease)   . H/O hiatal hernia   . Hypercholesteremia   . Hypertension   . Hypothyroidism   . Iron deficiency anemia   . LBBB (left bundle branch block)     Past Surgical History:  Procedure Laterality Date  . ABDOMINAL HYSTERECTOMY    . BREAST CYST EXCISION Right 1970's  . BUNIONECTOMY Bilateral   . CARDIAC CATHETERIZATION  05/11/2013  . CARPAL TUNNEL RELEASE Right   . CATARACT EXTRACTION W/ INTRAOCULAR LENS   IMPLANT, BILATERAL    . CHOLECYSTECTOMY    . CORONARY ANGIOPLASTY WITH STENT PLACEMENT  06/2012   DES proximal LAD [name] 05/11/2013  . ESOPHAGOGASTRODUODENOSCOPY N/A 02/14/2016   Procedure: ESOPHAGOGASTRODUODENOSCOPY (EGD);  Surgeon: Malissa Hippo, MD;  [facility];  Service: Endoscopy;  Laterality: N/A;  1:40  . HAMMER TOE SURGERY Bilateral   . LEFT HEART CATHETERIZATION WITH CORONARY ANGIOGRAM N/A 07/23/2012   Procedure: LEFT HEART CATHETERIZATION WITH CORONARY ANGIOGRAM;  Surgeon: Kathleene Hazel, MD;  Location: [facility] CATH LAB;  Service: Cardiovascular;  Laterality: N/A;  . LEFT HEART CATHETERIZATION WITH CORONARY ANGIOGRAM N/A 05/11/2013   Procedure: LEFT HEART CATHETERIZATION WITH CORONARY ANGIOGRAM;  Surgeon: Kathleene Hazel, MD;  Location: [facility] CATH LAB;  Service: Cardiovascular;  Laterality: N/A;  . PERCUTANEOUS CORONARY STENT INTERVENTION (PCI-S)  07/23/2012   Procedure: PERCUTANEOUS CORONARY STENT INTERVENTION (PCI-S);  Surgeon: Kathleene Hazel, MD;  Location: [facility] CATH LAB;  Service: Cardiovascular;;  . TOE SURGERY     "don't really remember which toe or which side; it was real crooked and had to be straightened" (05/11/2013)  . TOTAL KNEE ARTHROPLASTY Right 2000    There were no vitals filed for this visit.      Subjective Assessment - 04/11/17 1115    Subjective Pt reports that things are  going well. She is working on her HEP daily. No other complaints at this time.    Limitations Lifting;Walking   Patient Stated Goals walk better with a cane and get stronger   Currently in Pain? No/denies                         [facility] Adult PT Treatment/Exercise - 04/11/17 0001      Knee/Hip Exercises: Standing   Heel Raises Both;1 set;20 reps   Hip Extension Both;1 set;10 reps   Extension Limitations facing wall to ensure proper technique      Knee/Hip Exercises: Seated   Ball Squeeze 15x3 sec hold    Clamshell with TheraBand Green  x1 set  of 15 reps, increased to blue TB for 2nd set    Other Seated Knee/Hip Exercises toe raises x20 reps    Sit to Sand 2 sets;10 reps;without UE support;Other (comment)  red TB around knees      Knee/Hip Exercises: Supine   Bridges Both;1 set;10 reps   Bridges Limitations red TB around knees              Balance Exercises - 04/11/17 1144      Balance Exercises: Standing   Standing Eyes Opened Narrow base of support (BOS)  trunk rotation Lt/Rt x10 reps    Standing Eyes Closed Narrow base of support (BOS);1 rep;30 secs   Tandem Stance Eyes open;2 reps;20 secs   Rockerboard Lateral;Other (comment)  x1.5 min            PT Education - 04/11/17 1159    Education provided Yes   Education Details Updates to HEP technique for improved muscle activation and increased resistance; discussed likely cause of positional vertigo and benefits of treatment to improve dizziness and safety    Person(s) Educated Patient;Child(ren)   Methods Explanation;Verbal cues;Handout   Comprehension Verbalized understanding;Returned demonstration          PT Short Term Goals - 04/02/17 1517      PT SHORT TERM GOAL #1   Title Pt will be independent with HEP and perform consistently to maximize overall function and decrease risk of falls.   Time 3   Period Weeks   Status New     PT SHORT TERM GOAL #2   Title Pt will have improved TUG time with LRAD to 15 seconds or < to demonstrate improved overall functional strength and balance to maximize function in the community.   Time 3   Period Weeks   Status New     PT SHORT TERM GOAL #3   Title Pt will have improved bilateral SLS to at least 10 seconds with no UE support to demonstrate improved overall balance in order to maximize gait and decrease risk for falls   Time 3   Period Weeks   Status New           PT Long Term Goals - 04/02/17 1523      PT LONG TERM GOAL #1   Title Pt will have improved BERG balance score to at least 45/56 to  demonstrate improved overall balance and decrease risk for falls.   Time 6   Period Weeks   Status New     PT LONG TERM GOAL #2   Title Pt will have improved 5xSTS to at least 15 seconds or less with no UE support from chair to demonstrate improved overall functional strength and decrease risk of falls.   Time  6   Period Weeks   Status New     PT LONG TERM GOAL #3   Title Pt will have improved overall BLE MMT to at least 4+/5 to maximize gait and decrease risk for falls.   Time 6   Period Weeks   Status New     PT LONG TERM GOAL #4   Title Pt will report being able to shop for at least 1 hour with her family and require 3 rest breaks or < to demonstrate improved overall strength and maximize participation in the community.   Time 6   Period Weeks   Status New               Plan - 04/11/17 1202    Clinical Impression Statement Today's session focused on progression of LE strength and static balance activity. Pt reporting dizziness with trunk rotation activity, which resolved less than 1 minute after stopping the exercise. She also has a history of positional vertigo and would benefit from further evaluation of this to eliminate possible BPPV to assist with the safety of future balance activities performed in her session. Ended session with report of fatigue only and updated HEP to reflect progression in strength.   Rehab Potential Fair   PT Frequency 2x / week   PT Duration 6 weeks   PT Treatment/Interventions ADLs/Self Care Home Management;DME Instruction;Gait training;Stair training;Functional mobility training;Therapeutic activities;Therapeutic exercise;Balance training;Neuromuscular re-education;Patient/family education;Manual techniques;Passive range of motion;Energy conservation;Vestibular   PT Next Visit Plan Continue with CKC heel and toe raises, standing march, standing hip abd, side stepping, rockerboard.  Next session begin SLS, semi tandem stance with cone rotation    PT Home Exercise Plan eval: bridging, supine clamshells, sit <> stands   Consulted and Agree with Plan of Care Patient;Family member/caregiver   Family Member Consulted daughter       Patient will benefit from skilled therapeutic intervention in order to improve the following deficits and impairments:  Abnormal gait, Decreased activity tolerance, Decreased balance, Decreased endurance, Decreased mobility, Decreased strength, Difficulty walking, Dizziness  Visit Diagnosis: Unsteadiness on feet  Muscle weakness (generalized)  Difficulty in walking, not elsewhere classified  Other symptoms and signs involving the musculoskeletal system     Problem List Patient Active Problem List   Diagnosis Date Noted  . Cardiomyopathy, ischemic 05/13/2013  . HLD (hyperlipidemia) 09/10/2012  . CAD  s/p LAD stenting 8/13   . Diastolic CHF (HCC)   . Hypertension     12:13 PM,04/11/17 Marylyn IshiharaSara Kiser PT, DPT [facility] [phone]   [facility] [address] [place], [place], [address] Phone: [phone]   Fax:  [phone]  Name: Ana Pena MRN: [id number] Date of Birth: 04/13/1926

## 2017-04-16 ENCOUNTER — Telehealth: Payer: Self-pay | Admitting: *Deleted

## 2017-04-16 ENCOUNTER — Encounter (HOSPITAL_COMMUNITY): Payer: Self-pay

## 2017-04-16 ENCOUNTER — Ambulatory Visit (HOSPITAL_COMMUNITY): Payer: Medicare Other

## 2017-04-16 DIAGNOSIS — R29898 Other symptoms and signs involving the musculoskeletal system: Secondary | ICD-10-CM

## 2017-04-16 DIAGNOSIS — R2681 Unsteadiness on feet: Secondary | ICD-10-CM

## 2017-04-16 DIAGNOSIS — R262 Difficulty in walking, not elsewhere classified: Secondary | ICD-10-CM

## 2017-04-16 DIAGNOSIS — M6281 Muscle weakness (generalized): Secondary | ICD-10-CM

## 2017-04-16 NOTE — Patient Instructions (Signed)
  WALKER HIP ABDUCTION - FWW ABDUCTIONS  While standing up using a walker, raise your leg out to the side. Keep your knee straight and maintain your toes pointed forward the entire time.   Use your arms for support and balance and have a chair behind you for safety.    ELASTIC BAND LATERAL WALKS   With an elastic band around both ankles, walk to the side while keeping your feet spread apart. Keep your knees bent the entire time.  Keep knees straight

## 2017-04-16 NOTE — Telephone Encounter (Signed)
Notes recorded by Lesle ChrisHill, Trason Shifflet G, LPN on 1/61/09605/23/2018 at 2:09 PM EDT Marthe Patchhonda Cousins (daughter) notified. 3 week f/u scheduled for 05/06/2017 with Dr. Wyline MoodBranch. Copy to pmd. ------  Notes recorded by Antoine PocheBranch, Jonathan F, MD on 04/16/2017 at 1:53 PM EDT Please have patient stop taking amiodarone. Her recent breathing tests suggest that medication may be causing some of her SOB. She should f/u with me in 3 weeks

## 2017-04-16 NOTE — Therapy (Signed)
[facility] [address] [place], [place], [address] Phone: [phone]   Fax:  [phone]  Physical Therapy Treatment  Patient Details  Name: Ana Pena MRN: [id number] Date of Birth: 24-Feb-1926 Referring Provider: Antoine Poche, MD  Encounter Date: 04/16/2017      PT End of Session - 04/16/17 1032    Visit Number 4   Number of Visits 12   Date for PT Re-Evaluation 04/23/17   Authorization Type Medicare Part A and B   Authorization Time Period 04/02/17 to 05/14/17   Authorization - Visit Number 4   Authorization - Number of Visits 10   PT Start Time 1032   PT Stop Time 1107   PT Time Calculation (min) 35 min   Equipment Utilized During Treatment Gait belt   Activity Tolerance Patient limited by fatigue;No increased pain;Patient tolerated treatment well   Behavior During Therapy [facility] for tasks assessed/performed      Past Medical History:  Diagnosis Date  . Abnormal CT scan    a. Question of possible renal cyst in RUQ by CT scan 06/2012 at [facility]. b. Redemonstrated 04/2013, pt instructed to f/u PCP.  [name] Arthritis    "all over" (05/11/2013)  . Asthma   . CAD (coronary artery disease)    a. s/p PTCA/DES to LAD 07/23/12. b. Mildly elevated trop 04/2013 with stable cath, no culprit.  . Chronic lower back pain   . Chronic venous insufficiency   . CKD (chronic kidney disease)   . Diastolic CHF (HCC)    a. Prior normal EF 06/2013. b. EF 40-45% by echo 04/2013.  . Diverticulosis   . GERD (gastroesophageal reflux disease)   . H/O hiatal hernia   . Hypercholesteremia   . Hypertension   . Hypothyroidism   . Iron deficiency anemia   . LBBB (left bundle branch block)     Past Surgical History:  Procedure Laterality Date  . ABDOMINAL HYSTERECTOMY    . BREAST CYST EXCISION Right 1970's  . BUNIONECTOMY Bilateral   . CARDIAC CATHETERIZATION  05/11/2013  . CARPAL TUNNEL RELEASE Right   . CATARACT EXTRACTION W/ INTRAOCULAR LENS   IMPLANT, BILATERAL    . CHOLECYSTECTOMY    . CORONARY ANGIOPLASTY WITH STENT PLACEMENT  06/2012   DES proximal LAD [name] 05/11/2013  . ESOPHAGOGASTRODUODENOSCOPY N/A 02/14/2016   Procedure: ESOPHAGOGASTRODUODENOSCOPY (EGD);  Surgeon: Malissa Hippo, MD;  [facility];  Service: Endoscopy;  Laterality: N/A;  1:40  . HAMMER TOE SURGERY Bilateral   . LEFT HEART CATHETERIZATION WITH CORONARY ANGIOGRAM N/A 07/23/2012   Procedure: LEFT HEART CATHETERIZATION WITH CORONARY ANGIOGRAM;  Surgeon: Kathleene Hazel, MD;  Location: [facility] CATH LAB;  Service: Cardiovascular;  Laterality: N/A;  . LEFT HEART CATHETERIZATION WITH CORONARY ANGIOGRAM N/A 05/11/2013   Procedure: LEFT HEART CATHETERIZATION WITH CORONARY ANGIOGRAM;  Surgeon: Kathleene Hazel, MD;  Location: [facility] CATH LAB;  Service: Cardiovascular;  Laterality: N/A;  . PERCUTANEOUS CORONARY STENT INTERVENTION (PCI-S)  07/23/2012   Procedure: PERCUTANEOUS CORONARY STENT INTERVENTION (PCI-S);  Surgeon: Kathleene Hazel, MD;  Location: [facility] CATH LAB;  Service: Cardiovascular;;  . TOE SURGERY     "don't really remember which toe or which side; it was real crooked and had to be straightened" (05/11/2013)  . TOTAL KNEE ARTHROPLASTY Right 2000    There were no vitals filed for this visit.      Subjective Assessment - 04/16/17 1032    Subjective Pt states that she's a  little trembly this morning. She's not sure why but she did eat. Her HEP is going well.    Limitations Lifting;Walking   Patient Stated Goals walk better with a cane and get stronger   Currently in Pain? No/denies              [facility] Adult PT Treatment/Exercise - 04/16/17 0001      Knee/Hip Exercises: Standing   Heel Raises Both;1 set;20 reps   Heel Raises Limitations heel and toe   Hip Flexion Both;2 sets;10 reps   Hip Flexion Limitations BUE on // bars   Hip Abduction Stengthening;Both;2 sets;10 reps   Abduction Limitations RTB, BUE on // bars   Other  Standing Knee Exercises side stepping in // bars with RTB x 3RT          Balance Exercises - 04/16/17 1047      Balance Exercises: Standing   Rockerboard Lateral  x 2 mins, with dual task (serial 7's)   Cone Rotation Solid surface;Intermittent upper extremity assist;R/L  staggered and narrow staggered stance   Cone Rotation Limitations x 2 RT              PT Education - 04/16/17 1108    Education provided Yes   Education Details updated HEP to include side stepping and hip abd, exercise technique   Person(s) Educated Patient;Child(ren)   Methods Explanation;Demonstration;Handout   Comprehension Returned demonstration;Verbalized understanding          PT Short Term Goals - 04/02/17 1517      PT SHORT TERM GOAL #1   Title Pt will be independent with HEP and perform consistently to maximize overall function and decrease risk of falls.   Time 3   Period Weeks   Status New     PT SHORT TERM GOAL #2   Title Pt will have improved TUG time with LRAD to 15 seconds or < to demonstrate improved overall functional strength and balance to maximize function in the community.   Time 3   Period Weeks   Status New     PT SHORT TERM GOAL #3   Title Pt will have improved bilateral SLS to at least 10 seconds with no UE support to demonstrate improved overall balance in order to maximize gait and decrease risk for falls   Time 3   Period Weeks   Status New           PT Long Term Goals - 04/02/17 1523      PT LONG TERM GOAL #1   Title Pt will have improved BERG balance score to at least 45/56 to demonstrate improved overall balance and decrease risk for falls.   Time 6   Period Weeks   Status New     PT LONG TERM GOAL #2   Title Pt will have improved 5xSTS to at least 15 seconds or less with no UE support from chair to demonstrate improved overall functional strength and decrease risk of falls.   Time 6   Period Weeks   Status New     PT LONG TERM GOAL #3   Title Pt  will have improved overall BLE MMT to at least 4+/5 to maximize gait and decrease risk for falls.   Time 6   Period Weeks   Status New     PT LONG TERM GOAL #4   Title Pt will report being able to shop for at least 1 hour with her family and require 3 rest breaks  or < to demonstrate improved overall strength and maximize participation in the community.   Time 6   Period Weeks   Status New               Plan - 04/16/17 1109    Clinical Impression Statement Session limited today as pt with increased fatigue and reports of tiredness. She verbalized feeling "trembly" at beginning of session and during, but pt's BP WNL and she reported eating a good breakfast this morning. Pt had difficulty during rockerbaord with dual task and with NBOS staggered stance with cone rotation. Continue to progress pt's functional strengthening and balance activities per her tolerance.    Rehab Potential Fair   PT Frequency 2x / week   PT Duration 6 weeks   PT Treatment/Interventions ADLs/Self Care Home Management;DME Instruction;Gait training;Stair training;Functional mobility training;Therapeutic activities;Therapeutic exercise;Balance training;Neuromuscular re-education;Patient/family education;Manual techniques;Passive range of motion;Energy conservation;Vestibular   PT Next Visit Plan Continue with CKC heel and toe raises, standing march, standing hip abd, side stepping, rockerboard.  Next session begin SLS, semi tandem stance with cone rotation   PT Home Exercise Plan eval: bridging, supine clamshells, sit <> stands; 5/23: standing hip abd with RTB, side stepping with RTB   Consulted and Agree with Plan of Care Patient;Family member/caregiver   Family Member Consulted granddaughter      Patient will benefit from skilled therapeutic intervention in order to improve the following deficits and impairments:  Abnormal gait, Decreased activity tolerance, Decreased balance, Decreased endurance, Decreased  mobility, Decreased strength, Difficulty walking, Dizziness  Visit Diagnosis: Unsteadiness on feet  Muscle weakness (generalized)  Difficulty in walking, not elsewhere classified  Other symptoms and signs involving the musculoskeletal system     Problem List Patient Active Problem List   Diagnosis Date Noted  . Cardiomyopathy, ischemic 05/13/2013  . HLD (hyperlipidemia) 09/10/2012  . CAD  s/p LAD stenting 8/13   . Diastolic CHF (HCC)   . Hypertension      Jac Canavan PT, DPT   [facility] [address] [place], [place], [address] Phone: [phone]   Fax:  [phone]  Name: KETURA SIREK MRN: [id number] Date of Birth: January 27, 1926

## 2017-04-17 ENCOUNTER — Telehealth (HOSPITAL_COMMUNITY): Payer: Self-pay | Admitting: Physical Therapy

## 2017-04-17 NOTE — Telephone Encounter (Signed)
patient have a stomach bug

## 2017-04-18 ENCOUNTER — Encounter (HOSPITAL_COMMUNITY): Payer: Medicare Other | Admitting: Physical Therapy

## 2017-04-23 ENCOUNTER — Ambulatory Visit (HOSPITAL_COMMUNITY): Payer: Medicare Other | Admitting: Physical Therapy

## 2017-04-23 DIAGNOSIS — R2681 Unsteadiness on feet: Secondary | ICD-10-CM

## 2017-04-23 DIAGNOSIS — R262 Difficulty in walking, not elsewhere classified: Secondary | ICD-10-CM

## 2017-04-23 DIAGNOSIS — M6281 Muscle weakness (generalized): Secondary | ICD-10-CM

## 2017-04-23 DIAGNOSIS — R29898 Other symptoms and signs involving the musculoskeletal system: Secondary | ICD-10-CM

## 2017-04-23 NOTE — Therapy (Signed)
[facility] [address] [place], [place], [address] Phone: [phone]   Fax:  [phone]  Physical Therapy Treatment  Patient Details  Name: Ana Pena MRN: [id number] Date of Birth: [birth year] Referring Provider: Antoine Poche, MD  Encounter Date: 04/23/2017      PT End of Session - 04/23/17 1459    Visit Number 5   Number of Visits 12   Date for PT Re-Evaluation 04/23/17   Authorization Type Medicare Part A and B   Authorization Time Period 04/02/17 to 05/14/17   Authorization - Visit Number 5   Authorization - Number of Visits 10   PT Start Time 1304   PT Stop Time 1350   PT Time Calculation (min) 46 min   Equipment Utilized During Treatment Gait belt   Activity Tolerance Patient limited by fatigue;No increased pain;Patient tolerated treatment well   Behavior During Therapy [facility] for tasks assessed/performed      Past Medical History:  Diagnosis Date  . Abnormal CT scan    a. Question of possible renal cyst in RUQ by CT scan 06/2012 at [facility]. b. Redemonstrated 04/2013, pt instructed to f/u PCP.  [name] Arthritis    "all over" (05/11/2013)  . Asthma   . CAD (coronary artery disease)    a. s/p PTCA/DES to LAD 07/23/12. b. Mildly elevated trop 04/2013 with stable cath, no culprit.  . Chronic lower back pain   . Chronic venous insufficiency   . CKD (chronic kidney disease)   . Diastolic CHF (HCC)    a. Prior normal EF 06/2013. b. EF 40-45% by echo 04/2013.  . Diverticulosis   . GERD (gastroesophageal reflux disease)   . H/O hiatal hernia   . Hypercholesteremia   . Hypertension   . Hypothyroidism   . Iron deficiency anemia   . LBBB (left bundle branch block)     Past Surgical History:  Procedure Laterality Date  . ABDOMINAL HYSTERECTOMY    . BREAST CYST EXCISION Right 1970's  . BUNIONECTOMY Bilateral   . CARDIAC CATHETERIZATION  05/11/2013  . CARPAL TUNNEL RELEASE Right   . CATARACT EXTRACTION W/ INTRAOCULAR LENS   IMPLANT, BILATERAL    . CHOLECYSTECTOMY    . CORONARY ANGIOPLASTY WITH STENT PLACEMENT  06/2012   DES proximal LAD [name] 05/11/2013  . ESOPHAGOGASTRODUODENOSCOPY N/A 02/14/2016   Procedure: ESOPHAGOGASTRODUODENOSCOPY (EGD);  Surgeon: Malissa Hippo, MD;  [facility];  Service: Endoscopy;  Laterality: N/A;  1:40  . HAMMER TOE SURGERY Bilateral   . LEFT HEART CATHETERIZATION WITH CORONARY ANGIOGRAM N/A 07/23/2012   Procedure: LEFT HEART CATHETERIZATION WITH CORONARY ANGIOGRAM;  Surgeon: Kathleene Hazel, MD;  Location: [facility] CATH LAB;  Service: Cardiovascular;  Laterality: N/A;  . LEFT HEART CATHETERIZATION WITH CORONARY ANGIOGRAM N/A 05/11/2013   Procedure: LEFT HEART CATHETERIZATION WITH CORONARY ANGIOGRAM;  Surgeon: Kathleene Hazel, MD;  Location: [facility] CATH LAB;  Service: Cardiovascular;  Laterality: N/A;  . PERCUTANEOUS CORONARY STENT INTERVENTION (PCI-S)  07/23/2012   Procedure: PERCUTANEOUS CORONARY STENT INTERVENTION (PCI-S);  Surgeon: Kathleene Hazel, MD;  Location: [facility] CATH LAB;  Service: Cardiovascular;;  . TOE SURGERY     "don't really remember which toe or which side; it was real crooked and had to be straightened" (05/11/2013)  . TOTAL KNEE ARTHROPLASTY Right 2000    There were no vitals filed for this visit.      Subjective Assessment - 04/23/17 1457    Subjective Pt states she had the  stomach virus after her last session and had to cancel her next one because she was so weak.  STates she is currently not hurting, just weak.   Currently in Pain? No/denies                         [facility] Adult PT Treatment/Exercise - 04/23/17 0001      Knee/Hip Exercises: Standing   Heel Raises Both;1 set;15 reps   Heel Raises Limitations heel and toe   Hip Flexion Both;2 sets;10 reps   Hip Flexion Limitations no UE's   Forward Lunges Both;10 reps   Forward Lunges Limitations onto 4" step no UE's   Hip Abduction Stengthening;Both;10 reps;1 set    Abduction Limitations slow, controlled correct form   Hip Extension Both;1 set;10 reps   Extension Limitations slow, controlled in correct form   Gait Training with SPC 100 feet   Other Standing Knee Exercises sidestepping 2RT, retro gait 2RT             Balance Exercises - 04/23/17 1456      Balance Exercises: Standing   Cone Rotation Foam/compliant surface   Cone Rotation Limitations x 2 RT             PT Short Term Goals - 04/02/17 1517      PT SHORT TERM GOAL #1   Title Pt will be independent with HEP and perform consistently to maximize overall function and decrease risk of falls.   Time 3   Period Weeks   Status New     PT SHORT TERM GOAL #2   Title Pt will have improved TUG time with LRAD to 15 seconds or < to demonstrate improved overall functional strength and balance to maximize function in the community.   Time 3   Period Weeks   Status New     PT SHORT TERM GOAL #3   Title Pt will have improved bilateral SLS to at least 10 seconds with no UE support to demonstrate improved overall balance in order to maximize gait and decrease risk for falls   Time 3   Period Weeks   Status New           PT Long Term Goals - 04/02/17 1523      PT LONG TERM GOAL #1   Title Pt will have improved BERG balance score to at least 45/56 to demonstrate improved overall balance and decrease risk for falls.   Time 6   Period Weeks   Status New     PT LONG TERM GOAL #2   Title Pt will have improved 5xSTS to at least 15 seconds or less with no UE support from chair to demonstrate improved overall functional strength and decrease risk of falls.   Time 6   Period Weeks   Status New     PT LONG TERM GOAL #3   Title Pt will have improved overall BLE MMT to at least 4+/5 to maximize gait and decrease risk for falls.   Time 6   Period Weeks   Status New     PT LONG TERM GOAL #4   Title Pt will report being able to shop for at least 1 hour with her family and require 3  rest breaks or < to demonstrate improved overall strength and maximize participation in the community.   Time 6   Period Weeks   Status New  Plan - 04/23/17 1503    Clinical Impression Statement Resumed therex this session with noted fatigue and need to take rest breaks.  Pt with total of 4 short seated rest breaks druing session.  No trembling or SOB noted, however.  Progressed balance activties outside of parallel bars using blue line and no AD.  Pt required tactile and VC's to complete hip abd/extension exercises in correct form.  Addition of foam mat with cone rotations to further challenge balance.  No new exercises added to HEP this session.     Rehab Potential Fair   PT Frequency 2x / week   PT Duration 6 weeks   PT Treatment/Interventions ADLs/Self Care Home Management;DME Instruction;Gait training;Stair training;Functional mobility training;Therapeutic activities;Therapeutic exercise;Balance training;Neuromuscular re-education;Patient/family education;Manual techniques;Passive range of motion;Energy conservation;Vestibular   PT Next Visit Plan Continue with CKC functional exercises and progression of balance.  Next session begin SLS and tandem stance. Pt to bring her "hurry cane" to work with gait as her main goal is to get rid of the walker.    PT Home Exercise Plan eval: bridging, supine clamshells, sit <> stands; 5/23: standing hip abd with RTB, side stepping with RTB   Consulted and Agree with Plan of Care Patient;Family member/caregiver   Family Member Consulted granddaughter      Patient will benefit from skilled therapeutic intervention in order to improve the following deficits and impairments:  Abnormal gait, Decreased activity tolerance, Decreased balance, Decreased endurance, Decreased mobility, Decreased strength, Difficulty walking, Dizziness  Visit Diagnosis: Unsteadiness on feet  Muscle weakness (generalized)  Difficulty in walking, not elsewhere  classified  Other symptoms and signs involving the musculoskeletal system     Problem List Patient Active Problem List   Diagnosis Date Noted  . Cardiomyopathy, ischemic 05/13/2013  . HLD (hyperlipidemia) 09/10/2012  . CAD  s/p LAD stenting 8/13   . Diastolic CHF (HCC)   . Hypertension     Lurena Nidamy B Romey Cohea, PTA/CLT [phone]  04/23/2017, 3:13 PM  [facility] [address] , [place], [address] Phone: [phone]   Fax:  [phone]  Name: Eduard RouxMarie W Grigorian MRN: [id number] Date of Birth: 02/13/1926

## 2017-04-25 ENCOUNTER — Ambulatory Visit (HOSPITAL_COMMUNITY): Payer: Medicare Other | Attending: Cardiology

## 2017-04-25 ENCOUNTER — Encounter (HOSPITAL_COMMUNITY): Payer: Self-pay

## 2017-04-25 DIAGNOSIS — R262 Difficulty in walking, not elsewhere classified: Secondary | ICD-10-CM | POA: Diagnosis present

## 2017-04-25 DIAGNOSIS — R29898 Other symptoms and signs involving the musculoskeletal system: Secondary | ICD-10-CM | POA: Diagnosis present

## 2017-04-25 DIAGNOSIS — M6281 Muscle weakness (generalized): Secondary | ICD-10-CM | POA: Diagnosis present

## 2017-04-25 DIAGNOSIS — R2681 Unsteadiness on feet: Secondary | ICD-10-CM

## 2017-04-25 NOTE — Therapy (Signed)
[facility] [address] [place], [place], [address] Phone: [phone]   Fax:  [phone]  Physical Therapy Treatment  Patient Details  Name: Ana Pena MRN: [id number] Date of Birth: 12/21/1925 Referring Provider: Antoine Poche, MD  Encounter Date: 04/25/2017      PT End of Session - 04/25/17 0902    Visit Number 6   Number of Visits 12   Date for PT Re-Evaluation 04/23/17   Authorization Type Medicare Part A and B   Authorization Time Period 04/02/17 to 05/14/17   Authorization - Visit Number 6   Authorization - Number of Visits 10   PT Start Time 0902   PT Stop Time 0949   PT Time Calculation (min) 47 min   Equipment Utilized During Treatment Gait belt   Activity Tolerance Patient limited by fatigue;No increased pain;Patient tolerated treatment well   Behavior During Therapy [facility] for tasks assessed/performed      Past Medical History:  Diagnosis Date  . Abnormal CT scan    a. Question of possible renal cyst in RUQ by CT scan 06/2012 at [facility]. b. Redemonstrated 04/2013, pt instructed to f/u PCP.  [name] Arthritis    "all over" (05/11/2013)  . Asthma   . CAD (coronary artery disease)    a. s/p PTCA/DES to LAD 07/23/12. b. Mildly elevated trop 04/2013 with stable cath, no culprit.  . Chronic lower back pain   . Chronic venous insufficiency   . CKD (chronic kidney disease)   . Diastolic CHF (HCC)    a. Prior normal EF 06/2013. b. EF 40-45% by echo 04/2013.  . Diverticulosis   . GERD (gastroesophageal reflux disease)   . H/O hiatal hernia   . Hypercholesteremia   . Hypertension   . Hypothyroidism   . Iron deficiency anemia   . LBBB (left bundle branch block)     Past Surgical History:  Procedure Laterality Date  . ABDOMINAL HYSTERECTOMY    . BREAST CYST EXCISION Right 1970's  . BUNIONECTOMY Bilateral   . CARDIAC CATHETERIZATION  05/11/2013  . CARPAL TUNNEL RELEASE Right   . CATARACT EXTRACTION W/ INTRAOCULAR LENS   IMPLANT, BILATERAL    . CHOLECYSTECTOMY    . CORONARY ANGIOPLASTY WITH STENT PLACEMENT  06/2012   DES proximal LAD [name] 05/11/2013  . ESOPHAGOGASTRODUODENOSCOPY N/A 02/14/2016   Procedure: ESOPHAGOGASTRODUODENOSCOPY (EGD);  Surgeon: Malissa Hippo, MD;  [facility];  Service: Endoscopy;  Laterality: N/A;  1:40  . HAMMER TOE SURGERY Bilateral   . LEFT HEART CATHETERIZATION WITH CORONARY ANGIOGRAM N/A 07/23/2012   Procedure: LEFT HEART CATHETERIZATION WITH CORONARY ANGIOGRAM;  Surgeon: Kathleene Hazel, MD;  Location: [facility] CATH LAB;  Service: Cardiovascular;  Laterality: N/A;  . LEFT HEART CATHETERIZATION WITH CORONARY ANGIOGRAM N/A 05/11/2013   Procedure: LEFT HEART CATHETERIZATION WITH CORONARY ANGIOGRAM;  Surgeon: Kathleene Hazel, MD;  Location: [facility] CATH LAB;  Service: Cardiovascular;  Laterality: N/A;  . PERCUTANEOUS CORONARY STENT INTERVENTION (PCI-S)  07/23/2012   Procedure: PERCUTANEOUS CORONARY STENT INTERVENTION (PCI-S);  Surgeon: Kathleene Hazel, MD;  Location: [facility] CATH LAB;  Service: Cardiovascular;;  . TOE SURGERY     "don't really remember which toe or which side; it was real crooked and had to be straightened" (05/11/2013)  . TOTAL KNEE ARTHROPLASTY Right 2000    There were no vitals filed for this visit.      Subjective Assessment - 04/25/17 0903    Subjective Pt states that she feels  like she is getting stronger and her balance is improving. Her HEP is going well.    Limitations Lifting;Walking   How long can you sit comfortably? no issues   How long can you stand comfortably? not very long, due to weakess, might can do 5   How long can you walk comfortably? less than 5 mins and then has to sit due to weakness   Patient Stated Goals walk better with a cane and get stronger   Currently in Pain? No/denies                Vestibular Assessment - 04/25/17 0001      Positional Testing   Dix-Hallpike Dix-Hallpike Right     Dix-Hallpike  Right   Dix-Hallpike Right Symptoms Downbeat, right rotatory nystagmus  and reports of dizziness symptoms            [facility] Adult PT Treatment/Exercise - 04/25/17 0001      Knee/Hip Exercises: Standing   Heel Raises Both;1 set;20 reps   Heel Raises Limitations heel and toe   Hip Flexion Both;2 sets;10 reps   Hip Flexion Limitations on foam, intermittent UE support   Gait Training with SPC x 28126ft    Other Standing Knee Exercises --         Vestibular Treatment/Exercise - 04/25/17 0001      Vestibular Treatment/Exercise   Vestibular Treatment Provided Canalith Repositioning   Canalith Repositioning Epley Manuever Right      EPLEY MANUEVER RIGHT   Number of Reps  1   Overall Response Improved Symptoms   Response Details  pt reported diminished symptoms following Epley maneuver         Balance Exercises - 04/25/17 1317      Balance Exercises: Standing   SLS Eyes open;Solid surface;Intermittent upper extremity support;10 secs  7 reps BLE   Tandem Gait Forward;2 reps  CGA - min A throughout   Other Standing Exercises sidestepping with RTB 715ft x 2 RT with no UE support (2-3 minor LOBs but able to recover independently)               PT Education - 04/25/17 1300    Education provided Yes   Education Details Hallpike-Dix findings, could potentially feel a little woozy for the next day or 2 after the BPPV testing   Person(s) Educated Patient;Child(ren)   Methods Explanation;Demonstration   Comprehension Verbalized understanding;Returned demonstration          PT Short Term Goals - 04/02/17 1517      PT SHORT TERM GOAL #1   Title Pt will be independent with HEP and perform consistently to maximize overall function and decrease risk of falls.   Time 3   Period Weeks   Status New     PT SHORT TERM GOAL #2   Title Pt will have improved TUG time with LRAD to 15 seconds or < to demonstrate improved overall functional strength and balance to maximize function  in the community.   Time 3   Period Weeks   Status New     PT SHORT TERM GOAL #3   Title Pt will have improved bilateral SLS to at least 10 seconds with no UE support to demonstrate improved overall balance in order to maximize gait and decrease risk for falls   Time 3   Period Weeks   Status New           PT Long Term Goals - 04/02/17 [id number]  PT LONG TERM GOAL #1   Title Pt will have improved BERG balance score to at least 45/56 to demonstrate improved overall balance and decrease risk for falls.   Time 6   Period Weeks   Status New     PT LONG TERM GOAL #2   Title Pt will have improved 5xSTS to at least 15 seconds or less with no UE support from chair to demonstrate improved overall functional strength and decrease risk of falls.   Time 6   Period Weeks   Status New     PT LONG TERM GOAL #3   Title Pt will have improved overall BLE MMT to at least 4+/5 to maximize gait and decrease risk for falls.   Time 6   Period Weeks   Status New     PT LONG TERM GOAL #4   Title Pt will report being able to shop for at least 1 hour with her family and require 3 rest breaks or < to demonstrate improved overall strength and maximize participation in the community.   Time 6   Period Weeks   Status New               Plan - 04/25/17 1304    Clinical Impression Statement Pt did well with progressed therex, balance, and gait training this date. Pt able to ambulate 232ft with her SPC with no notes of unsteadiness and with good gait speed. PT told pt that she was safe to begin ambulating inside her house with the [facility], but to continue to use her rollator in the community. Pt and dtr verbalized understanding, though the pt wanted to try and take her [facility] to church. PT educated pt that she could try that if she wanted to, but to still have the rollator with them in case she needed it. Pt still c/o dizziness when rolling over in bed to the R and with turning her head R>L. Performed  Hallpike-Dix to the R and pt had + test as she reported her symptoms were recreated and she had nystagmus. Treated pt with Epley's Maneuver x 1. Educated pt that she may feel a little woozy over the next day or so but that following, she should feel better. Pt and dtr verbalized understanding. Will reassess this next session to assess response. Continue POC as planned.   Rehab Potential Fair   PT Frequency 2x / week   PT Duration 6 weeks   PT Treatment/Interventions ADLs/Self Care Home Management;DME Instruction;Gait training;Stair training;Functional mobility training;Therapeutic activities;Therapeutic exercise;Balance training;Neuromuscular re-education;Patient/family education;Manual techniques;Passive range of motion;Energy conservation;Vestibular   PT Next Visit Plan Continue with CKC functional exercises and progression of balance. Continue SLS and tandem stance/gait activities. Continue gait training with SPC, reassess Hallpike-Dix/pt's response to it last session;    PT Home Exercise Plan eval: bridging, supine clamshells, sit <> stands; 5/23: standing hip abd with RTB, side stepping with RTB   Consulted and Agree with Plan of Care Patient;Family member/caregiver   Family Member Consulted dtr      Patient will benefit from skilled therapeutic intervention in order to improve the following deficits and impairments:  Abnormal gait, Decreased activity tolerance, Decreased balance, Decreased endurance, Decreased mobility, Decreased strength, Difficulty walking, Dizziness  Visit Diagnosis: Unsteadiness on feet  Muscle weakness (generalized)  Difficulty in walking, not elsewhere classified  Other symptoms and signs involving the musculoskeletal system     Problem List Patient Active Problem List   Diagnosis Date Noted  . Cardiomyopathy, ischemic  05/13/2013  . HLD (hyperlipidemia) 09/10/2012  . CAD  s/p LAD stenting 8/13   . Diastolic CHF (HCC)   . Hypertension      Jac Canavan PT, DPT   [facility] [address] [place], [place], [address] Phone: [phone]   Fax:  [phone]  Name: Ana Pena MRN: [id number] Date of Birth: Oct 16, 1926

## 2017-04-28 ENCOUNTER — Telehealth: Payer: Self-pay

## 2017-04-28 NOTE — Telephone Encounter (Signed)
Patients daughter stated since increasing Norvasc on 03/28/17 feet have been swelling more than usual. Patient is having shortness of breath. Daughter took patient to PCP today for shortness of breath and cough. Chest xray was done today, antibiotics started as well as steroids. Daughter has been giving patient torsemide almost daily and cannot get swelling down. Daughter said she is worried about kidneys so has been trying to give torsemide for 3 days and then off for 2 day. Blood pressure today was 124/66 and daughter states this is how it has been running

## 2017-04-29 ENCOUNTER — Ambulatory Visit (INDEPENDENT_AMBULATORY_CARE_PROVIDER_SITE_OTHER): Payer: Medicare Other | Admitting: Internal Medicine

## 2017-04-29 ENCOUNTER — Telehealth (HOSPITAL_COMMUNITY): Payer: Self-pay

## 2017-04-29 ENCOUNTER — Ambulatory Visit (HOSPITAL_COMMUNITY): Payer: Medicare Other

## 2017-04-29 MED ORDER — HYDRALAZINE HCL 25 MG PO TABS: 25.0000 mg | ORAL_TABLET | Freq: Three times a day (TID) | ORAL | 3 refills | Status: AC

## 2017-04-29 NOTE — Telephone Encounter (Signed)
Norvasc certaintly can cause some swelling, that type of swelling doesn't respond well to toresmide. Is her weight up at all? Lets have her stop norvasc. Start hydralzine for bp 25mg  tid. Update us Thursday on her bp's and symptoms. It sounds like her pcp thinks her SOB is related to infection as opposed to fluid based on the workup so far.   Dominga FerryJ Agam Davenport MD

## 2017-04-29 NOTE — Telephone Encounter (Signed)
Daughter called and cx this apptment due to patient having a  cold and not feeling well-patient when to the MD yesterday and had a chest X-Ray

## 2017-04-29 NOTE — Telephone Encounter (Signed)
Patients daughter made aware. Weight is up 5 pounds. Chest xray was clear. Will call Thursday with updates.

## 2017-05-01 ENCOUNTER — Ambulatory Visit (HOSPITAL_COMMUNITY): Payer: Medicare Other | Admitting: Physical Therapy

## 2017-05-02 ENCOUNTER — Telehealth: Payer: Self-pay | Admitting: Cardiology

## 2017-05-02 NOTE — Telephone Encounter (Signed)
Ana LoserRhonda -daughter called stating that Dr. Wyline MoodBranch wanted her to call the office in regards to her mother's  BP and new BP medication .

## 2017-05-02 NOTE — Telephone Encounter (Signed)
Im ok with those bp's for now, particularly those measured on 6/7, as the new medication continues to take effect. If bp persistently 160s or higher have her give us a call. Im ok with 140-150s at this time.   Dominga FerryJ Arryn Terrones MD

## 2017-05-02 NOTE — Telephone Encounter (Signed)
Ana LoserRhonda daughter voiced understanding

## 2017-05-02 NOTE — Telephone Encounter (Signed)
Pt f/u from 6/4 phone note - hydralazine 25 mg tid added Pt denies any swelling at this time - BP from Wednesday and Thursday as follows- says she took BP 3 times each day  6/6 - 169/79 - 150/69 - 178/77 6/7 - 148/66 - 141/70 - 149/66

## 2017-05-06 ENCOUNTER — Ambulatory Visit (HOSPITAL_COMMUNITY): Payer: Medicare Other | Admitting: Physical Therapy

## 2017-05-06 ENCOUNTER — Encounter (HOSPITAL_COMMUNITY): Payer: Self-pay | Admitting: Emergency Medicine

## 2017-05-06 ENCOUNTER — Ambulatory Visit: Payer: Medicare Other | Admitting: Cardiology

## 2017-05-06 ENCOUNTER — Emergency Department (HOSPITAL_COMMUNITY): Payer: Medicare Other

## 2017-05-06 ENCOUNTER — Emergency Department (HOSPITAL_COMMUNITY)
Admission: EM | Admit: 2017-05-06 | Discharge: 2017-05-06 | Disposition: A | Discharge status: Home / Self Care | Payer: Medicare Other | Source: Home / Self Care | Attending: Emergency Medicine | Admitting: Emergency Medicine

## 2017-05-06 DIAGNOSIS — K219 Gastro-esophageal reflux disease without esophagitis: Secondary | ICD-10-CM | POA: Diagnosis present

## 2017-05-06 DIAGNOSIS — Z961 Presence of intraocular lens: Secondary | ICD-10-CM | POA: Diagnosis present

## 2017-05-06 DIAGNOSIS — I255 Ischemic cardiomyopathy: Secondary | ICD-10-CM | POA: Diagnosis present

## 2017-05-06 DIAGNOSIS — Z885 Allergy status to narcotic agent status: Secondary | ICD-10-CM

## 2017-05-06 DIAGNOSIS — I130 Hypertensive heart and chronic kidney disease with heart failure and stage 1 through stage 4 chronic kidney disease, or unspecified chronic kidney disease: Secondary | ICD-10-CM | POA: Diagnosis present

## 2017-05-06 DIAGNOSIS — J45909 Unspecified asthma, uncomplicated: Secondary | ICD-10-CM | POA: Insufficient documentation

## 2017-05-06 DIAGNOSIS — N179 Acute kidney failure, unspecified: Secondary | ICD-10-CM | POA: Diagnosis present

## 2017-05-06 DIAGNOSIS — N189 Chronic kidney disease, unspecified: Secondary | ICD-10-CM | POA: Insufficient documentation

## 2017-05-06 DIAGNOSIS — Z79899 Other long term (current) drug therapy: Secondary | ICD-10-CM

## 2017-05-06 DIAGNOSIS — Y999 Unspecified external cause status: Secondary | ICD-10-CM

## 2017-05-06 DIAGNOSIS — Y929 Unspecified place or not applicable: Secondary | ICD-10-CM | POA: Insufficient documentation

## 2017-05-06 DIAGNOSIS — Z90710 Acquired absence of both cervix and uterus: Secondary | ICD-10-CM

## 2017-05-06 DIAGNOSIS — I2510 Atherosclerotic heart disease of native coronary artery without angina pectoris: Secondary | ICD-10-CM | POA: Diagnosis present

## 2017-05-06 DIAGNOSIS — W19XXXA Unspecified fall, initial encounter: Secondary | ICD-10-CM

## 2017-05-06 DIAGNOSIS — R2681 Unsteadiness on feet: Secondary | ICD-10-CM

## 2017-05-06 DIAGNOSIS — Y939 Activity, unspecified: Secondary | ICD-10-CM

## 2017-05-06 DIAGNOSIS — Z882 Allergy status to sulfonamides status: Secondary | ICD-10-CM

## 2017-05-06 DIAGNOSIS — N390 Urinary tract infection, site not specified: Secondary | ICD-10-CM | POA: Diagnosis present

## 2017-05-06 DIAGNOSIS — Z9841 Cataract extraction status, right eye: Secondary | ICD-10-CM

## 2017-05-06 DIAGNOSIS — I480 Paroxysmal atrial fibrillation: Secondary | ICD-10-CM | POA: Diagnosis present

## 2017-05-06 DIAGNOSIS — W010XXA Fall on same level from slipping, tripping and stumbling without subsequent striking against object, initial encounter: Secondary | ICD-10-CM | POA: Diagnosis present

## 2017-05-06 DIAGNOSIS — E039 Hypothyroidism, unspecified: Secondary | ICD-10-CM | POA: Diagnosis present

## 2017-05-06 DIAGNOSIS — Z96651 Presence of right artificial knee joint: Secondary | ICD-10-CM | POA: Diagnosis present

## 2017-05-06 DIAGNOSIS — S81011A Laceration without foreign body, right knee, initial encounter: Secondary | ICD-10-CM | POA: Diagnosis present

## 2017-05-06 DIAGNOSIS — I447 Left bundle-branch block, unspecified: Secondary | ICD-10-CM | POA: Diagnosis present

## 2017-05-06 DIAGNOSIS — Z8249 Family history of ischemic heart disease and other diseases of the circulatory system: Secondary | ICD-10-CM

## 2017-05-06 DIAGNOSIS — I5030 Unspecified diastolic (congestive) heart failure: Secondary | ICD-10-CM | POA: Insufficient documentation

## 2017-05-06 DIAGNOSIS — N183 Chronic kidney disease, stage 3 (moderate): Secondary | ICD-10-CM | POA: Diagnosis present

## 2017-05-06 DIAGNOSIS — J441 Chronic obstructive pulmonary disease with (acute) exacerbation: Secondary | ICD-10-CM | POA: Diagnosis present

## 2017-05-06 DIAGNOSIS — R55 Syncope and collapse: Secondary | ICD-10-CM | POA: Diagnosis not present

## 2017-05-06 DIAGNOSIS — I951 Orthostatic hypotension: Principal | ICD-10-CM | POA: Diagnosis present

## 2017-05-06 DIAGNOSIS — Z23 Encounter for immunization: Secondary | ICD-10-CM

## 2017-05-06 DIAGNOSIS — M6281 Muscle weakness (generalized): Secondary | ICD-10-CM

## 2017-05-06 DIAGNOSIS — Z9049 Acquired absence of other specified parts of digestive tract: Secondary | ICD-10-CM

## 2017-05-06 DIAGNOSIS — R29898 Other symptoms and signs involving the musculoskeletal system: Secondary | ICD-10-CM

## 2017-05-06 DIAGNOSIS — Z955 Presence of coronary angioplasty implant and graft: Secondary | ICD-10-CM

## 2017-05-06 DIAGNOSIS — E7800 Pure hypercholesterolemia, unspecified: Secondary | ICD-10-CM | POA: Diagnosis present

## 2017-05-06 DIAGNOSIS — Z981 Arthrodesis status: Secondary | ICD-10-CM

## 2017-05-06 DIAGNOSIS — R262 Difficulty in walking, not elsewhere classified: Secondary | ICD-10-CM

## 2017-05-06 DIAGNOSIS — E860 Dehydration: Secondary | ICD-10-CM | POA: Diagnosis present

## 2017-05-06 DIAGNOSIS — D62 Acute posthemorrhagic anemia: Secondary | ICD-10-CM | POA: Diagnosis present

## 2017-05-06 DIAGNOSIS — I872 Venous insufficiency (chronic) (peripheral): Secondary | ICD-10-CM | POA: Diagnosis present

## 2017-05-06 DIAGNOSIS — M1990 Unspecified osteoarthritis, unspecified site: Secondary | ICD-10-CM | POA: Diagnosis present

## 2017-05-06 DIAGNOSIS — Z9842 Cataract extraction status, left eye: Secondary | ICD-10-CM

## 2017-05-06 DIAGNOSIS — I5032 Chronic diastolic (congestive) heart failure: Secondary | ICD-10-CM | POA: Diagnosis present

## 2017-05-06 MED ORDER — LIDOCAINE-EPINEPHRINE (PF) 1 %-1:200000 IJ SOLN
INTRAMUSCULAR | Status: AC
  Administered 2017-05-06: 30 mL
  Filled 2017-05-06: qty 30

## 2017-05-06 MED ORDER — CEPHALEXIN 500 MG PO CAPS
500.0000 mg | ORAL_CAPSULE | Freq: Two times a day (BID) | ORAL | 0 refills | Status: DC
Start: 2017-05-06 — End: 2017-05-10

## 2017-05-06 MED ORDER — TETANUS-DIPHTH-ACELL PERTUSSIS 5-2.5-18.5 LF-MCG/0.5 IM SUSP
0.5000 mL | Freq: Once | INTRAMUSCULAR | Status: AC
  Administered 2017-05-06: 0.5 mL via INTRAMUSCULAR
  Filled 2017-05-06: qty 0.5

## 2017-05-06 MED ORDER — SILVER NITRATE-POT NITRATE 75-25 % EX MISC
CUTANEOUS | Status: AC
  Filled 2017-05-06: qty 1

## 2017-05-06 NOTE — ED Provider Notes (Signed)
[facility] DEPT Provider Note   CSN: [id number] Arrival date & time: 05/06/17  1611     History   Chief Complaint Chief Complaint  Patient presents with  . Fall  . Laceration    HPI Ana Pena is a 83 y.o. female.  Patient states that she was at the doctor's office and slipped and fell on her knee. Patient has a laceration to her knee   The history is provided by the patient.  Fall  This is a new problem. The current episode started less than 1 hour ago. The problem occurs rarely. The problem has been resolved. Pertinent negatives include no chest pain, no abdominal pain and no headaches. Nothing aggravates the symptoms. Nothing relieves the symptoms.  Laceration      Past Medical History:  Diagnosis Date  . Abnormal CT scan    a. Question of possible renal cyst in RUQ by CT [facility]. b. Redemonstrated 04/2013, pt instructed to f/u PCP.  [name]. Arthritis    "all over" (05/11/2013)  . Asthma   . CAD (coronary artery disease)    a. s/p PTCA/DES to LAD 07/23/12. b. Mildly elevated trop 04/2013 with stable cath, no culprit.  . Chronic lower back pain   . Chronic venous insufficiency   . CKD (chronic kidney disease)   . Diastolic CHF (HCC)    a. Prior normal EF 06/2013. b. EF 40-45% by echo 04/2013.  . Diverticulosis   . GERD (gastroesophageal reflux disease)   . H/O hiatal hernia   . Hypercholesteremia   . Hypertension   . Hypothyroidism   . Iron deficiency anemia   . LBBB (left bundle branch block)     Patient Active Problem List   Diagnosis Date Noted  . Cardiomyopathy, ischemic 05/13/2013  . HLD (hyperlipidemia) 09/10/2012  . CAD  s/p LAD stenting 8/13   . Diastolic CHF (HCC)   . Hypertension     Past Surgical History:  Procedure Laterality Date  . ABDOMINAL HYSTERECTOMY    . BREAST CYST EXCISION Right 1970's  . BUNIONECTOMY Bilateral   . CARDIAC CATHETERIZATION  05/11/2013  . CARPAL TUNNEL RELEASE Right   . CATARACT EXTRACTION W/  INTRAOCULAR LENS  IMPLANT, BILATERAL    . CHOLECYSTECTOMY    . CORONARY ANGIOPLASTY WITH STENT PLACEMENT  06/2012   DES proximal LAD [name]/notes 05/11/2013  . ESOPHAGOGASTRODUODENOSCOPY N/A 02/14/2016   Procedure: ESOPHAGOGASTRODUODENOSCOPY (EGD);  Surgeon: Malissa HippoNajeeb U Rehman, MD;  Location: [facility];  Service: Endoscopy;  Laterality: N/A;  1:40  . HAMMER TOE SURGERY Bilateral   . LEFT HEART CATHETERIZATION WITH CORONARY ANGIOGRAM N/A 07/23/2012   Procedure: LEFT HEART CATHETERIZATION WITH CORONARY ANGIOGRAM;  Surgeon: Kathleene Hazelhristopher D McAlhany, MD;  [facility];  Service: Cardiovascular;  Laterality: N/A;  . LEFT HEART CATHETERIZATION WITH CORONARY ANGIOGRAM N/A 05/11/2013   Procedure: LEFT HEART CATHETERIZATION WITH CORONARY ANGIOGRAM;  Surgeon: Kathleene Hazelhristopher D McAlhany, MD;  [facility];  Service: Cardiovascular;  Laterality: N/A;  . PERCUTANEOUS CORONARY STENT INTERVENTION (PCI-S)  07/23/2012   Procedure: PERCUTANEOUS CORONARY STENT INTERVENTION (PCI-S);  Surgeon: Kathleene Hazelhristopher D McAlhany, MD;  [facility];  Service: Cardiovascular;;  . TOE SURGERY     "don't really remember which toe or which side; it was real crooked and had to be straightened" (05/11/2013)  . TOTAL KNEE ARTHROPLASTY Right 2000    OB History    No data available       Home Medications  Prior to Admission medications   Medication Sig Start Date End Date Taking? Authorizing Provider  atorvastatin (LIPITOR) 20 MG tablet Take 20 mg by mouth every evening.    [provider]  cephALEXin (KEFLEX) 500 MG capsule Take 1 capsule (500 mg total) by mouth 2 (two) times daily. 05/06/17   Bethann Berkshire, MD  Cholecalciferol (VITAMIN D-3) 1000 UNITS CAPS Take 5,000 Units by mouth daily.     [provider]  ELIQUIS 2.5 MG TABS tablet TAKE ONE TABLET BY MOUTH TWICE A DAY 12/09/16   Antoine Poche, MD  hydrALAZINE (APRESOLINE) 25 MG tablet Take 1 tablet (25 mg total) by mouth 3 (three) times  daily. 04/29/17 07/28/17  Antoine Poche, MD  levothyroxine (SYNTHROID, LEVOTHROID) 100 MCG tablet Take 100 mcg by mouth daily before breakfast.    [provider]  Magnesium Oxide 200 MG TABS Take 200 mg by mouth 2 (two) times daily.    [provider]  metoprolol succinate (TOPROL-XL) 25 MG 24 hr tablet TAKE ONE TABLET BY MOUTH DAILY ( STOP CARVEDILOL) 03/31/17   Branch, Dorothe Pea, MD  nitroGLYCERIN (NITROSTAT) 0.4 MG SL tablet Place 0.4 mg under the tongue every 5 (five) minutes as needed for chest pain (up to 3 doses). 07/24/12   Dunn, Dayna N, PA-C  ondansetron (ZOFRAN) 4 MG tablet TAKE ONE TABLET BY MOUTH TWICE A DAY AS NEEDED FOR NAUSEA OR VOMITING 08/16/16   Setzer, Brand Males, NP  pregabalin (LYRICA) 50 MG capsule Take 50 mg by mouth 2 (two) times daily.    [provider]  psyllium (REGULOID) 0.52 G capsule Take 0.52 g by mouth 2 (two) times daily.     [provider]  torsemide (DEMADEX) 20 MG tablet Take 1 tablet (20 mg total) by mouth daily. As needed for swelling 03/28/15   Antoine Poche, MD  traMADol (ULTRAM) 50 MG tablet Take 1 tablet by mouth 2 (two) times daily. 06/20/16   [provider]  Dwyane Luo 100-62.5-25 MCG/INH AEPB  02/13/17   [provider]    Family History Family History  Problem Relation Age of Onset  . Breast cancer Mother   . Heart disease Father     Social History Social History  Substance Use Topics  . Smoking status: Never Smoker  . Smokeless tobacco: Never Used  . Alcohol use No     Allergies   Morphine and related; Sulfa antibiotics; and Sulfa drugs cross reactors   Review of Systems Review of Systems  Constitutional: Negative for appetite change and fatigue.  HENT: Negative for congestion, ear discharge and sinus pressure.   Eyes: Negative for discharge.  Respiratory: Negative for cough.   Cardiovascular: Negative for chest pain.  Gastrointestinal: Negative for abdominal pain and  diarrhea.  Genitourinary: Negative for frequency and hematuria.  Musculoskeletal: Negative for back pain.       Left leg pain  Skin: Negative for rash.  Neurological: Negative for seizures and headaches.  Psychiatric/Behavioral: Negative for hallucinations.     Physical Exam Updated Vital Signs BP 110/73   Pulse 68   Temp 98.1 F (36.7 C) (Oral)   Resp 20   Ht 5' (1.524 m)   Wt 70.3 kg (155 lb)   SpO2 91%   BMI 30.27 kg/m   Physical Exam  Constitutional: She is oriented to person, place, and time. She appears well-developed.  HENT:  Head: Normocephalic.  Eyes: Conjunctivae and EOM are normal. No scleral icterus.  Neck:  Neck supple. No thyromegaly present.  Cardiovascular: Normal rate and regular rhythm.  Exam reveals no gallop and no friction rub.   No murmur heard. Pulmonary/Chest: No stridor. She has no wheezes. She has no rales. She exhibits no tenderness.  Abdominal: She exhibits no distension. There is no tenderness. There is no rebound.  Musculoskeletal: Normal range of motion. She exhibits no edema.  Patient has on a 8 cm laceration to her right knee which is superficial but bleeding significantly  Lymphadenopathy:    She has no cervical adenopathy.  Neurological: She is oriented to person, place, and time. She exhibits normal muscle tone. Coordination normal.  Skin: No rash noted. No erythema.  Psychiatric: She has a normal mood and affect. Her behavior is normal.     ED Treatments / Results  Labs (all labs ordered are listed, but only abnormal results are displayed) Labs Reviewed - No data to display  EKG  EKG Interpretation None       Radiology Dg Tibia/fibula Right  Result Date: 05/06/2017 CLINICAL DATA:  Tripped over chair with laceration to the lower leg. EXAM: RIGHT TIBIA AND FIBULA - 2 VIEW COMPARISON:  None. FINDINGS: Previous total knee arthroplasty. Soft tissue swelling of the prepatellar soft tissues consistent with injury. No evidence of  patellar fracture. No joint fluid or air. IMPRESSION: No evidence of bone or joint injury. Soft tissue swelling anterior to the patella and patellar tendon consistent with soft tissue injury. Electronically Signed   By: Paulina Fusi M.D.   On: 05/06/2017 17:13    Procedures .Marland KitchenLaceration Repair Date/Time: 05/06/2017 7:11 PM Performed by: Bethann Berkshire Authorized by: Bethann Berkshire   Comments:     Patient with an 8 cm laceration to her right knee. Laceration was cleaned with peroxide and saline. 1% lidocaine with epi was used to numb the area. 11 sutures were used to close laceration. 3-0 nylon was used. Patient tolerated the procedure well.   (including critical care time)  Medications Ordered in ED Medications  silver nitrate applicators 75-25 % applicator (not administered)  Tdap (BOOSTRIX) injection 0.5 mL (0.5 mLs Intramuscular Given 05/06/17 1644)  lidocaine-EPINEPHrine (XYLOCAINE-EPINEPHrine) 1 %-1:200000 (PF) injection (30 mLs  Given by Other 05/06/17 1850)     Initial Impression / Assessment and Plan / ED Course  I have reviewed the triage vital signs and the nursing notes.  Pertinent labs & imaging results that were available during my care of the patient were reviewed by me and considered in my medical decision making (see chart for details).     Fall with contusion to right knee and laceration. Patient put on Keflex for a few days and will follow-up in 2 days for recheck.  sutures should be removed in 10-14 days  Final Clinical Impressions(s) / ED Diagnoses   Final diagnoses:  Fall, initial encounter    New Prescriptions New Prescriptions   CEPHALEXIN (KEFLEX) 500 MG CAPSULE    Take 1 capsule (500 mg total) by mouth 2 (two) times daily.     Bethann Berkshire, MD 05/06/17 [phone]

## 2017-05-06 NOTE — Discharge Instructions (Signed)
Follow-up in 2 days for recheck. Get sutures out in 10-14 days

## 2017-05-06 NOTE — ED Triage Notes (Signed)
Pt reports being at Dr. Verna CzechBranch's office for a checkup and tripping over the chair when her name was called.  Laceration to right knee and several skin tears noted.

## 2017-05-06 NOTE — Therapy (Signed)
[facility] [address] [place], [place], [address] Phone: [phone]   Fax:  [phone]  Physical Therapy Treatment  Patient Details  Name: Ana Pena MRN: [id number] Date of Birth: 05/21/1926 Referring Provider: Antoine PocheJonathan F Branch, MD  Encounter Date: 05/06/2017      PT End of Session - 05/06/17 1217    Visit Number 7   Number of Visits 12   Date for PT Re-Evaluation 04/23/17   Authorization Type Medicare Part A and B   Authorization Time Period 04/02/17 to 05/14/17   Authorization - Visit Number 7   Authorization - Number of Visits 10   PT Start Time 1035   PT Stop Time 1122   PT Time Calculation (min) 47 min   Equipment Utilized During Treatment Gait belt   Activity Tolerance Patient limited by fatigue;No increased pain;Patient tolerated treatment well   Behavior During Therapy [facility] for tasks assessed/performed      Past Medical History:  Diagnosis Date  . Abnormal CT scan    a. Question of possible renal cyst in RUQ [facility]. b. Redemonstrated 04/2013, pt instructed to f/u PCP.  [name]. Arthritis    "all over" (05/11/2013)  . Asthma   . CAD (coronary artery disease)    a. s/p PTCA/DES to LAD 07/23/12. b. Mildly elevated trop 04/2013 with stable cath, no culprit.  . Chronic lower back pain   . Chronic venous insufficiency   . CKD (chronic kidney disease)   . Diastolic CHF (HCC)    a. Prior normal EF 06/2013. b. EF 40-45% by echo 04/2013.  . Diverticulosis   . GERD (gastroesophageal reflux disease)   . H/O hiatal hernia   . Hypercholesteremia   . Hypertension   . Hypothyroidism   . Iron deficiency anemia   . LBBB (left bundle branch block)     Past Surgical History:  Procedure Laterality Date  . ABDOMINAL HYSTERECTOMY    . BREAST CYST EXCISION Right 1970's  . BUNIONECTOMY Bilateral   . CARDIAC CATHETERIZATION  05/11/2013  . CARPAL TUNNEL RELEASE Right   . CATARACT EXTRACTION W/ INTRAOCULAR LENS   IMPLANT, BILATERAL    . CHOLECYSTECTOMY    . CORONARY ANGIOPLASTY WITH STENT PLACEMENT  06/2012   DES proximal LAD [name]/notes 05/11/2013  . ESOPHAGOGASTRODUODENOSCOPY N/A 02/14/2016   Procedure: ESOPHAGOGASTRODUODENOSCOPY (EGD);  Surgeon: Malissa HippoNajeeb U Rehman, MD;  Location: [facility];  Service: Endoscopy;  Laterality: N/A;  1:40  . HAMMER TOE SURGERY Bilateral   . LEFT HEART CATHETERIZATION WITH CORONARY ANGIOGRAM N/A 07/23/2012   Procedure: LEFT HEART CATHETERIZATION WITH CORONARY ANGIOGRAM;  Surgeon: Kathleene Hazelhristopher D McAlhany, MD;  [facility];  Service: Cardiovascular;  Laterality: N/A;  . LEFT HEART CATHETERIZATION WITH CORONARY ANGIOGRAM N/A 05/11/2013   Procedure: LEFT HEART CATHETERIZATION WITH CORONARY ANGIOGRAM;  Surgeon: Kathleene Hazelhristopher D McAlhany, MD;  [facility];  Service: Cardiovascular;  Laterality: N/A;  . PERCUTANEOUS CORONARY STENT INTERVENTION (PCI-S)  07/23/2012   Procedure: PERCUTANEOUS CORONARY STENT INTERVENTION (PCI-S);  Surgeon: Kathleene Hazelhristopher D McAlhany, MD;  [facility];  Service: Cardiovascular;;  . TOE SURGERY     "don't really remember which toe or which side; it was real crooked and had to be straightened" (05/11/2013)  . TOTAL KNEE ARTHROPLASTY Right 2000    There were no vitals filed for this visit.      Subjective Assessment - 05/06/17 1221    Subjective Pt states she feels really  weak today.  States she remains SOB but her O2 levels remain above 95%.  BP taken beginning/middle/end of session today (160/90, 150/85, 150/80)   Currently in Pain? No/denies                         [facility] Adult PT Treatment/Exercise - 05/06/17 0001      Knee/Hip Exercises: Standing   Hip Extension Both;1 set;10 reps   Extension Limitations slow, controlled in correct form     Knee/Hip Exercises: Seated   Long Arc Quad Both;10 reps     Knee/Hip Exercises: Supine   Bridges Both;1 set;10 reps     Knee/Hip Exercises: Sidelying   Hip ABduction  Both;2 sets;10 reps   Clams 10 reps each                  PT Short Term Goals - 04/02/17 1517      PT SHORT TERM GOAL #1   Title Pt will be independent with HEP and perform consistently to maximize overall function and decrease risk of falls.   Time 3   Period Weeks   Status New     PT SHORT TERM GOAL #2   Title Pt will have improved TUG time with LRAD to 15 seconds or < to demonstrate improved overall functional strength and balance to maximize function in the community.   Time 3   Period Weeks   Status New     PT SHORT TERM GOAL #3   Title Pt will have improved bilateral SLS to at least 10 seconds with no UE support to demonstrate improved overall balance in order to maximize gait and decrease risk for falls   Time 3   Period Weeks   Status New           PT Long Term Goals - 04/02/17 1523      PT LONG TERM GOAL #1   Title Pt will have improved BERG balance score to at least 45/56 to demonstrate improved overall balance and decrease risk for falls.   Time 6   Period Weeks   Status New     PT LONG TERM GOAL #2   Title Pt will have improved 5xSTS to at least 15 seconds or less with no UE support from chair to demonstrate improved overall functional strength and decrease risk of falls.   Time 6   Period Weeks   Status New     PT LONG TERM GOAL #3   Title Pt will have improved overall BLE MMT to at least 4+/5 to maximize gait and decrease risk for falls.   Time 6   Period Weeks   Status New     PT LONG TERM GOAL #4   Title Pt will report being able to shop for at least 1 hour with her family and require 3 rest breaks or < to demonstrate improved overall strength and maximize participation in the community.   Time 6   Period Weeks   Status New               Plan - 05/06/17 1218    Clinical Impression Statement Pt returns today after missing several appt.  Returns to cardiologist today.  Accompanied by CG who states she continues to have  weaknesss and SOB/wheezing.  Completed LE strengthening activities this session, focusing on weak hip musculture.  Pt with some dizziness rolling Rt to Lt for sidelying exercises but able to subside.  No therapist available today to evaluate vertigo, however reports the manuevers last session did seem to help.  Updated HEP to include sidelying hip abduction and clams this session.    Rehab Potential Fair   PT Frequency 2x / week   PT Duration 6 weeks   PT Treatment/Interventions ADLs/Self Care Home Management;DME Instruction;Gait training;Stair training;Functional mobility training;Therapeutic activities;Therapeutic exercise;Balance training;Neuromuscular re-education;Patient/family education;Manual techniques;Passive range of motion;Energy conservation;Vestibular   PT Next Visit Plan re-evaluate next session.  Re-visit vertigo manuevers.   PT Home Exercise Plan eval: bridging, supine clamshells, sit <> stands; 5/23: standing hip abd with RTB, side stepping with RTB  6/12:  sidelying hip abduction, sidelying clams   Consulted and Agree with Plan of Care Patient;Family member/caregiver   Family Member Consulted dtr      Patient will benefit from skilled therapeutic intervention in order to improve the following deficits and impairments:  Abnormal gait, Decreased activity tolerance, Decreased balance, Decreased endurance, Decreased mobility, Decreased strength, Difficulty walking, Dizziness  Visit Diagnosis: Unsteadiness on feet  Muscle weakness (generalized)  Difficulty in walking, not elsewhere classified  Other symptoms and signs involving the musculoskeletal system     Problem List Patient Active Problem List   Diagnosis Date Noted  . Cardiomyopathy, ischemic 05/13/2013  . HLD (hyperlipidemia) 09/10/2012  . CAD  s/p LAD stenting 8/13   . Diastolic CHF (HCC)   . Hypertension     Lurena Nida, PTA/CLT [phone]  05/06/2017, 12:24 PM  [facility] [address] [place], [place], [address] Phone: [phone]   Fax:  [phone]  Name: KEOSHA ROSSA MRN: [id number] Date of Birth: [birth year]

## 2017-05-06 NOTE — Patient Instructions (Signed)
Abduction: Side Leg Lift (Eccentric) - Side-Lying    Lie on side. Lift top leg slightly higher than shoulder level. Keep top leg straight with body, toes pointing forward. Slowly lower for 3-5 seconds. _10__ reps per set, _2__ sets per day   Abduction: Clam (Eccentric) - Side-Lying    Lie on side with knees bent. Lift top knee, keeping feet together. Keep trunk steady. Slowly lower for 3-5 seconds. _10__ reps per set, __2_ sets per day    EXTENSION: Standing (Active)    Stand, both feet flat. Draw right leg behind body as far as possible. Complete _2__ sets of _10__ repetitions. Perform __2_ sessions per day.

## 2017-05-06 NOTE — Progress Notes (Deleted)
Clinical Summary Ms. Ana Pena is a 83 y.o.female seen today for follow up of the following medical problems.   1. Afib - 11/16/15 monitor with reviewed including full disclosure report, shows episodes of afib/aflutter with RVR as well as some sinus bradycardia.  - on admiodarone and eliquis  - no recent palpitations. No bleeding troubles on eliquis.   - amidarone stopped after 03/2017 PFTs, decreased DLCO compared to 06/2016 testing   2. CAD/ICM/Chronic systolic heart failure - history of prior DES to LAD in 06/2012  - cath 04/2013 with patent LAD stent  - LVEF 40-45% by echo 04/2013  - at a prior visit described some DOE with short distances. Notes some chest pain at times. Episode while at [facility]. Sharp pain in midchest, 10/10 in severity. No other symptoms. No positional. Lasted 15 minutes. Occurs once every 3-4 weeks. No relation to food.  - we repeated her echo which showed stable LVEF at 40-45%, grade I diastolic dysfunction.  - Lexiscan MPI 06/2015 showed no ischemia. - benazepril stopped due to hyperkalemia. Generalized fatigue did not improve with trial off coreg   - we recently changed coreg to Toprol as thought could be causing some bronchospasm. - recent increased SOB, now on home O2.   3. HTN - elevated in clinic - at pcp visit 01/2017 bps 120s/70s.  - we had previously lowered her norvasc due to orthostatic symptoms. On further history appears being off balance with standing is more so related to leg weakness as opposed to dizziness. Since lowering norvasc no improvement in symptoms.   - leg swelling with higher doses of norvasc. Norvasc was stopped, hydralazine 25mg  tid started.   4. History of vertigo - can have dizziness with turning head  5. Hyperkalemia - elevated K in 12/2016, took kayexelate x 2 doses - repeat labs 12/2016 K had normalized, though had a mild uptrend in Cr    6. Pneumonia - episode early this year, seen in ER -  completed course of abx - inhaler changed from symbicort - hypoxia without home O2. 2 Liters  - no recent edema. Home weight up some, roughly 4 pounds which is within her range. - has some occasional wheezing.     Past Medical History:  Diagnosis Date  . Abnormal CT scan    a. Question of possible renal cyst in RUQ by CT scan 06/2012 at [facility]. b. Redemonstrated 04/2013, pt instructed to f/u PCP.  [name] Arthritis    "all over" (05/11/2013)  . Asthma   . CAD (coronary artery disease)    a. s/p PTCA/DES to LAD 07/23/12. b. Mildly elevated trop 04/2013 with stable cath, no culprit.  . Chronic lower back pain   . Chronic venous insufficiency   . CKD (chronic kidney disease)   . Diastolic CHF (HCC)    a. Prior normal EF 06/2013. b. EF 40-45% by echo 04/2013.  . Diverticulosis   . GERD (gastroesophageal reflux disease)   . H/O hiatal hernia   . Hypercholesteremia   . Hypertension   . Hypothyroidism   . Iron deficiency anemia   . LBBB (left bundle Ana Pena block)      Allergies  Allergen Reactions  . Morphine And Related Nausea Only  . Sulfa Antibiotics     unknown  . Sulfa Drugs Cross Reactors     unknown     Current Outpatient Prescriptions  Medication Sig Dispense Refill  . atorvastatin (LIPITOR) 20 MG tablet Take 20 mg by mouth  every evening.    . Cholecalciferol (VITAMIN D-3) 1000 UNITS CAPS Take 5,000 Units by mouth daily.     [name]. ELIQUIS 2.5 MG TABS tablet TAKE ONE TABLET BY MOUTH TWICE A DAY 60 tablet 5  . hydrALAZINE (APRESOLINE) 25 MG tablet Take 1 tablet (25 mg total) by mouth 3 (three) times daily. 270 tablet 3  . levothyroxine (SYNTHROID, LEVOTHROID) 100 MCG tablet Take 100 mcg by mouth daily before breakfast.    . Magnesium Oxide 200 MG TABS Take 200 mg by mouth 2 (two) times daily.    . metoprolol succinate (TOPROL-XL) 25 MG 24 hr tablet TAKE ONE TABLET BY MOUTH DAILY ( STOP CARVEDILOL) 90 tablet 3  . nitroGLYCERIN (NITROSTAT) 0.4 MG SL tablet Place 0.4 mg under the  tongue every 5 (five) minutes as needed for chest pain (up to 3 doses).    . ondansetron (ZOFRAN) 4 MG tablet TAKE ONE TABLET BY MOUTH TWICE A DAY AS NEEDED FOR NAUSEA OR VOMITING 30 tablet 0  . pregabalin (LYRICA) 50 MG capsule Take 50 mg by mouth 2 (two) times daily.    . psyllium (REGULOID) 0.52 G capsule Take 0.52 g by mouth 2 (two) times daily.     [name]. torsemide (DEMADEX) 20 MG tablet Take 1 tablet (20 mg total) by mouth daily. As needed for swelling 90 tablet 3  . traMADol (ULTRAM) 50 MG tablet Take 1 tablet by mouth 2 (two) times daily.    . TRELEGY ELLIPTA 100-62.5-25 MCG/INH AEPB      No current facility-administered medications for this visit.      Past Surgical History:  Procedure Laterality Date  . ABDOMINAL HYSTERECTOMY    . BREAST CYST EXCISION Right 1970's  . BUNIONECTOMY Bilateral   . CARDIAC CATHETERIZATION  05/11/2013  . CARPAL TUNNEL RELEASE Right   . CATARACT EXTRACTION W/ INTRAOCULAR LENS  IMPLANT, BILATERAL    . CHOLECYSTECTOMY    . CORONARY ANGIOPLASTY WITH STENT PLACEMENT  06/2012   DES proximal LAD [name]/notes 05/11/2013  . ESOPHAGOGASTRODUODENOSCOPY N/A 02/14/2016   Procedure: ESOPHAGOGASTRODUODENOSCOPY (EGD);  Surgeon: Malissa HippoNajeeb U Rehman, MD;  Location: [facility];  Service: Endoscopy;  Laterality: N/A;  1:40  . HAMMER TOE SURGERY Bilateral   . LEFT HEART CATHETERIZATION WITH CORONARY ANGIOGRAM N/A 07/23/2012   Procedure: LEFT HEART CATHETERIZATION WITH CORONARY ANGIOGRAM;  Surgeon: Kathleene Hazelhristopher D McAlhany, MD;  [facility];  Service: Cardiovascular;  Laterality: N/A;  . LEFT HEART CATHETERIZATION WITH CORONARY ANGIOGRAM N/A 05/11/2013   Procedure: LEFT HEART CATHETERIZATION WITH CORONARY ANGIOGRAM;  Surgeon: Kathleene Hazelhristopher D McAlhany, MD;  [facility];  Service: Cardiovascular;  Laterality: N/A;  . PERCUTANEOUS CORONARY STENT INTERVENTION (PCI-S)  07/23/2012   Procedure: PERCUTANEOUS CORONARY STENT INTERVENTION (PCI-S);  Surgeon: Kathleene Hazelhristopher D McAlhany,  MD;  [facility];  Service: Cardiovascular;;  . TOE SURGERY     "don't really remember which toe or which side; it was real crooked and had to be straightened" (05/11/2013)  . TOTAL KNEE ARTHROPLASTY Right 2000     Allergies  Allergen Reactions  . Morphine And Related Nausea Only  . Sulfa Antibiotics     unknown  . Sulfa Drugs Cross Reactors     unknown      Family History  Problem Relation Age of Onset  . Breast cancer Mother   . Heart disease Father      Social History Ms. Ana Pena reports that she has never smoked. She has never used smokeless tobacco. Ms.  Ana Pena reports that she does not drink alcohol.   Review of Systems CONSTITUTIONAL: No weight loss, fever, chills, weakness or fatigue.  HEENT: Eyes: No visual loss, blurred vision, double vision or yellow sclerae.No hearing loss, sneezing, congestion, runny nose or sore throat.  SKIN: No rash or itching.  CARDIOVASCULAR:  RESPIRATORY: No shortness of breath, cough or sputum.  GASTROINTESTINAL: No anorexia, nausea, vomiting or diarrhea. No abdominal pain or blood.  GENITOURINARY: No burning on urination, no polyuria NEUROLOGICAL: No headache, dizziness, syncope, paralysis, ataxia, numbness or tingling in the extremities. No change in bowel or bladder control.  MUSCULOSKELETAL: No muscle, back pain, joint pain or stiffness.  LYMPHATICS: No enlarged nodes. No history of splenectomy.  PSYCHIATRIC: No history of depression or anxiety.  ENDOCRINOLOGIC: No reports of sweating, cold or heat intolerance. No polyuria or polydipsia.  [name]   Physical Examination There were no vitals filed for this visit. There were no vitals filed for this visit.  Gen: resting comfortably, no acute distress HEENT: no scleral icterus, pupils equal round and reactive, no palptable cervical adenopathy,  CV Resp: Clear to auscultation bilaterally GI: abdomen is soft, non-tender, non-distended, normal bowel sounds, no  hepatosplenomegaly MSK: extremities are warm, no edema.  Skin: warm, no rash Neuro:  no focal deficits Psych: appropriate affect   Diagnostic Studies 04/2013 Cath Hemodynamic Findings: Central aortic pressure: 161/60  Left ventricular pressure: 159/8/13  Angiographic Findings:  Left main: No obstructive disease.  Left Anterior Descending Artery: Large caliber vessel that courses to the apex. The proximal vessel has 20% stenosis. The mid vessel has a patent stented segment with no restenosis noted. The diagonal is patent. The mid to distal vessel has a focal 50% smooth stenosis which does not appear to be flow limiting, unchanged in appearance from cath in 2013.  Circumflex Artery: Moderate sized vessel with proximal plaque, 40% smooth stenosis in the mid vessel. The first OM is small and patent. The AV groove Circumflex terminates into a moderate caliber second OM Ana Pena.  Right Coronary Artery: Large, dominant vessel with 30% mid stenosis.  Left Ventricular Angiogram: Deferred.  Impression:  1. Stable single vessel CAD with patent stent proximal to mid LAD  2. Mild to moderate non-obstructive disease in the distal LAD, distal Circumflex and mid RCA  Recommendations: Continue medical management. Her d-dimer is slightly elevated but CTA chest at [facility] without evidence of PE. Will monitor overnight.   04/2013 Echo LVEF 40-45%, grade I diastolic dysfunction,   06/2015 echo Study Conclusions  - Left ventricle: The cavity size was normal. There was mild concentric hypertrophy. Systolic function was mildly to moderately reduced. The estimated ejection fraction was in the range of 40% to 45%. Diffuse hypokinesis. Doppler parameters are consistent with abnormal left ventricular relaxation (grade 1 diastolic dysfunction). Elevated filling pressures. - Ventricular septum: Septal motion showed abnormal function and dyssynergy. These changes are consistent  with intraventricular conduction delay. - Aortic valve: There was trivial regurgitation. - Mitral valve: Mildly thickened leaflets . There was mild regurgitation. - Left atrium: The atrium was mildly dilated. - Tricuspid valve: There was mild regurgitation. - Pulmonary arteries: PA peak pressure: 38 mm Hg (S). Mildly elevated pulmonary pressures.  06/2015 Lexiscan MPI  LBBB present throughout study.  There is a small defect of mild severity present in the mid anteroseptal, apical anterior and apical septal location. The defect is non-reversible. Consistent with scar versus soft tissue attenuation. No definite ischemia.  This is a low risk study.  Nuclear stress  EF: 53%. Mild global hypokinesis        Assessment and Plan  1. Afib -no recent symptoms.  - continue eliquis for stroke prevention, her CHADS2Vasc score is 6.  - workup for her SOB as described below, pending results may need to consider coming off amio  2. CAD/ICM/Chronic systolic HF/SOB - recent SOB ongoing since pneumonia in January. Does not appear volume overlaoded by exam - obtain PA/lateral CXR   3. HTN - elevated in clinic, recently at goal at her pcp visit - we will continue to monitor at this time. She will submit bp log in 1 week  4. Leg weakness - refer to PT, unsteady gait      Antoine Poche, M.D., F.A.C.C.

## 2017-05-07 ENCOUNTER — Inpatient Hospital Stay (HOSPITAL_COMMUNITY)
Admission: EM | Admit: 2017-05-07 | Discharge: 2017-05-10 | DRG: 312 | Disposition: A | Discharge status: Home Health | Payer: Medicare Other | Attending: Internal Medicine | Admitting: Internal Medicine

## 2017-05-07 ENCOUNTER — Emergency Department (HOSPITAL_COMMUNITY): Payer: Medicare Other

## 2017-05-07 ENCOUNTER — Encounter (HOSPITAL_COMMUNITY): Payer: Self-pay | Admitting: Emergency Medicine

## 2017-05-07 ENCOUNTER — Observation Stay (HOSPITAL_BASED_OUTPATIENT_CLINIC_OR_DEPARTMENT_OTHER): Payer: Medicare Other

## 2017-05-07 ENCOUNTER — Telehealth (HOSPITAL_COMMUNITY): Payer: Self-pay

## 2017-05-07 DIAGNOSIS — I255 Ischemic cardiomyopathy: Secondary | ICD-10-CM | POA: Diagnosis present

## 2017-05-07 DIAGNOSIS — I10 Essential (primary) hypertension: Secondary | ICD-10-CM | POA: Diagnosis present

## 2017-05-07 DIAGNOSIS — I360 Nonrheumatic tricuspid (valve) stenosis: Secondary | ICD-10-CM

## 2017-05-07 DIAGNOSIS — R55 Syncope and collapse: Secondary | ICD-10-CM | POA: Diagnosis not present

## 2017-05-07 DIAGNOSIS — N179 Acute kidney failure, unspecified: Secondary | ICD-10-CM | POA: Diagnosis present

## 2017-05-07 DIAGNOSIS — N183 Chronic kidney disease, stage 3 unspecified: Secondary | ICD-10-CM | POA: Diagnosis present

## 2017-05-07 DIAGNOSIS — S81011A Laceration without foreign body, right knee, initial encounter: Secondary | ICD-10-CM | POA: Diagnosis present

## 2017-05-07 DIAGNOSIS — I480 Paroxysmal atrial fibrillation: Secondary | ICD-10-CM | POA: Diagnosis present

## 2017-05-07 DIAGNOSIS — D649 Anemia, unspecified: Secondary | ICD-10-CM | POA: Diagnosis present

## 2017-05-07 DIAGNOSIS — N390 Urinary tract infection, site not specified: Secondary | ICD-10-CM | POA: Diagnosis not present

## 2017-05-07 DIAGNOSIS — R062 Wheezing: Secondary | ICD-10-CM

## 2017-05-07 DIAGNOSIS — I2510 Atherosclerotic heart disease of native coronary artery without angina pectoris: Secondary | ICD-10-CM | POA: Diagnosis present

## 2017-05-07 DIAGNOSIS — I5030 Unspecified diastolic (congestive) heart failure: Secondary | ICD-10-CM | POA: Diagnosis present

## 2017-05-07 LAB — COMPREHENSIVE METABOLIC PANEL
ALBUMIN: 3.8 g/dL (ref 3.5–5.0)
ALT: 21 U/L (ref 14–54)
ANION GAP: 11 (ref 5–15)
AST: 25 U/L (ref 15–41)
Alkaline Phosphatase: 58 U/L (ref 38–126)
BUN: 41 mg/dL — ABNORMAL HIGH (ref 6–20)
CHLORIDE: 100 mmol/L — AB (ref 101–111)
CO2: 27 mmol/L (ref 22–32)
Calcium: 9.2 mg/dL (ref 8.9–10.3)
Creatinine, Ser: 2.2 mg/dL — ABNORMAL HIGH (ref 0.44–1.00)
GFR calc Af Amer: 22 mL/min — ABNORMAL LOW (ref >60)
GFR calc non Af Amer: 19 mL/min — ABNORMAL LOW (ref >60)
GLUCOSE: 140 mg/dL — AB (ref 65–99)
POTASSIUM: 4.3 mmol/L (ref 3.5–5.1)
SODIUM: 138 mmol/L (ref 135–145)
Total Bilirubin: 0.9 mg/dL (ref 0.3–1.2)
Total Protein: 6.5 g/dL (ref 6.5–8.1)

## 2017-05-07 LAB — ECHOCARDIOGRAM COMPLETE
AVLVOTPG: 4 mmHg
CHL CUP DOP CALC LVOT VTI: 22.5 cm
CHL CUP MV DEC (S): 394
CHL CUP STROKE VOLUME: 21 mL
E/e' ratio: 10.65
EWDT: 394 ms
FS: 39 % (ref 28–44)
HEIGHTINCHES: 60 in
IVS/LV PW RATIO, ED: 1.22
LA ID, A-P, ES: 25 mm
LA diam end sys: 25 mm
LA vol A4C: 40.7 ml
LA vol index: 19.2 mL/m2
LA vol: 34.6 mL
LADIAMINDEX: 1.39 cm/m2
LV E/e' medial: 10.65
LV E/e'average: 10.65
LV PW d: 11 mm — AB (ref 0.6–1.1)
LV TDI E'LATERAL: 4.46
LV dias vol index: 20 mL/m2
LV dias vol: 36 mL — AB (ref 46–106)
LV e' LATERAL: 4.46 cm/s
LV sys vol index: 8 mL/m2
LVOT SV: 57 mL
LVOT area: 2.54 cm2
LVOT diameter: 18 mm
LVOT peak vel: 94.6 cm/s
LVSYSVOL: 15 mL (ref 14–42)
MV pk E vel: 47.5 m/s
MVPKAVEL: 109 m/s
RV TAPSE: 14.7 mm
RV sys press: 30 mmHg
Reg peak vel: 262 cm/s
Simpson's disk: 58
TDI e' medial: 4.46
TRMAXVEL: 262 cm/s
WEIGHTICAEL: 2600 [oz_av]

## 2017-05-07 LAB — CBC WITH DIFFERENTIAL/PLATELET
BASOS PCT: 0 %
Basophils Absolute: 0 10*3/uL (ref 0.0–0.1)
EOS ABS: 0 10*3/uL (ref 0.0–0.7)
Eosinophils Relative: 0 %
HCT: 36.2 % (ref 36.0–46.0)
Hemoglobin: 11.9 g/dL — ABNORMAL LOW (ref 12.0–15.0)
Lymphocytes Relative: 12 %
Lymphs Abs: 1.4 10*3/uL (ref 0.7–4.0)
MCH: 30.5 pg (ref 26.0–34.0)
MCHC: 32.9 g/dL (ref 30.0–36.0)
MCV: 92.8 fL (ref 78.0–100.0)
MONO ABS: 0.8 10*3/uL (ref 0.1–1.0)
MONOS PCT: 7 %
NEUTROS PCT: 81 %
Neutro Abs: 9.6 10*3/uL — ABNORMAL HIGH (ref 1.7–7.7)
Platelets: 206 10*3/uL (ref 150–400)
RBC: 3.9 MIL/uL (ref 3.87–5.11)
RDW: 14.8 % (ref 11.5–15.5)
WBC: 11.9 10*3/uL — ABNORMAL HIGH (ref 4.0–10.5)

## 2017-05-07 LAB — URINALYSIS, ROUTINE W REFLEX MICROSCOPIC
BILIRUBIN URINE: NEGATIVE
Glucose, UA: NEGATIVE mg/dL
Hgb urine dipstick: NEGATIVE
NITRITE: NEGATIVE
Protein, ur: 100 mg/dL — AB
SPECIFIC GRAVITY, URINE: 1.025 (ref 1.005–1.030)
pH: 5.5 (ref 5.0–8.0)

## 2017-05-07 LAB — TROPONIN I: Troponin I: <0.03 ng/mL (ref <0.03)

## 2017-05-07 LAB — URINALYSIS, MICROSCOPIC (REFLEX): RBC / HPF: NONE SEEN RBC/hpf (ref 0–5)

## 2017-05-07 LAB — I-STAT CG4 LACTIC ACID, ED: Lactic Acid, Venous: 1.67 mmol/L (ref 0.5–1.9)

## 2017-05-07 LAB — I-STAT TROPONIN, ED: TROPONIN I, POC: 0.01 ng/mL (ref 0.00–0.08)

## 2017-05-07 LAB — LIPASE, BLOOD: Lipase: 21 U/L (ref 11–51)

## 2017-05-07 MED ORDER — ONDANSETRON HCL 4 MG PO TABS: 4.0000 mg | ORAL_TABLET | Freq: Four times a day (QID) | ORAL | Status: DC | PRN

## 2017-05-07 MED ORDER — SODIUM CHLORIDE 0.9 % IV SOLN
INTRAVENOUS | Status: DC
  Administered 2017-05-07 (×2): via INTRAVENOUS

## 2017-05-07 MED ORDER — SODIUM CHLORIDE 0.9 % IV BOLUS (SEPSIS)
500.0000 mL | Freq: Once | INTRAVENOUS | Status: AC
  Administered 2017-05-07: 500 mL via INTRAVENOUS

## 2017-05-07 MED ORDER — CEFTRIAXONE SODIUM 1 G IJ SOLR
1.0000 g | Freq: Once | INTRAMUSCULAR | Status: AC
  Administered 2017-05-07: 1 g via INTRAVENOUS
  Filled 2017-05-07: qty 10

## 2017-05-07 MED ORDER — PREGABALIN 50 MG PO CAPS
50.0000 mg | ORAL_CAPSULE | Freq: Two times a day (BID) | ORAL | Status: DC
  Administered 2017-05-07 – 2017-05-10 (×6): 50 mg via ORAL
  Filled 2017-05-07 (×6): qty 1

## 2017-05-07 MED ORDER — LEVOTHYROXINE SODIUM 100 MCG PO TABS
100.0000 ug | ORAL_TABLET | Freq: Every day | ORAL | Status: DC
  Administered 2017-05-08 – 2017-05-10 (×3): 100 ug via ORAL
  Filled 2017-05-07 (×3): qty 1

## 2017-05-07 MED ORDER — ATORVASTATIN CALCIUM 20 MG PO TABS
20.0000 mg | ORAL_TABLET | Freq: Every evening | ORAL | Status: DC
  Administered 2017-05-07 – 2017-05-09 (×3): 20 mg via ORAL
  Filled 2017-05-07 (×3): qty 1

## 2017-05-07 MED ORDER — APIXABAN 2.5 MG PO TABS
2.5000 mg | ORAL_TABLET | Freq: Two times a day (BID) | ORAL | Status: DC
  Administered 2017-05-07 – 2017-05-08 (×3): 2.5 mg via ORAL
  Filled 2017-05-07 (×3): qty 1

## 2017-05-07 MED ORDER — CEPHALEXIN 500 MG PO CAPS: 500.0000 mg | ORAL_CAPSULE | Freq: Two times a day (BID) | ORAL | Status: DC

## 2017-05-07 MED ORDER — METOPROLOL SUCCINATE ER 25 MG PO TB24
25.0000 mg | ORAL_TABLET | Freq: Every day | ORAL | Status: DC
  Administered 2017-05-07 – 2017-05-10 (×4): 25 mg via ORAL
  Filled 2017-05-07 (×4): qty 1

## 2017-05-07 MED ORDER — ONDANSETRON HCL 4 MG/2ML IJ SOLN: 4.0000 mg | Freq: Four times a day (QID) | INTRAMUSCULAR | Status: DC | PRN

## 2017-05-07 MED ORDER — DEXTROSE 5 % IV SOLN
1.0000 g | INTRAVENOUS | Status: DC
  Administered 2017-05-08: 1 g via INTRAVENOUS
  Filled 2017-05-07 (×2): qty 10

## 2017-05-07 MED ORDER — TRAMADOL HCL 50 MG PO TABS
50.0000 mg | ORAL_TABLET | Freq: Two times a day (BID) | ORAL | Status: DC
  Administered 2017-05-07 – 2017-05-10 (×6): 50 mg via ORAL
  Filled 2017-05-07 (×6): qty 1

## 2017-05-07 NOTE — ED Provider Notes (Signed)
[facility]-EMER[facility]Y DEPT Provider Note   CSN: [id number] Arrival date & time: 05/07/17  0213     History   Chief Complaint Chief Complaint  Patient presents with  . Loss of Consciousness    HPI Ana Pena is a 83 y.o. female.  Patient presents to the emergency department for evaluation of syncope. Patient was seen in the ER earlier for a laceration of her knee which occurred after she tripped over a chair. After going home she started to feel bad. She reports that she started to feel nausea and cramping in her abdomen, then had an episode of vomiting. After that she passed out. Family reports that she went briefly unconscious, partially regained consciousness and then passed out again. Neither episode lasted more than a minute. Patient doesn't remember this occurring. No evidence of seizure activity. She did fall to the ground, however, is on a blood thinner. She reports neck pain after the fall. She has not had any chest pain, palpitations, shortness of breath.      Past Medical History:  Diagnosis Date  . Abnormal CT scan    a. Question of possible renal cyst in RUQ by CT scan 06/2012 at [facility]. b. Redemonstrated 04/2013, pt instructed to f/u PCP.  [name] Arthritis    "all over" (05/11/2013)  . Asthma   . CAD (coronary artery disease)    a. s/p PTCA/DES to LAD 07/23/12. b. Mildly elevated trop 04/2013 with stable cath, no culprit.  . Chronic lower back pain   . Chronic venous insufficiency   . CKD (chronic kidney disease)   . Diastolic CHF (HCC)    a. Prior normal EF 06/2013. b. EF 40-45% by echo 04/2013.  . Diverticulosis   . GERD (gastroesophageal reflux disease)   . H/O hiatal hernia   . Hypercholesteremia   . Hypertension   . Hypothyroidism   . Iron deficiency anemia   . LBBB (left bundle branch block)     Patient Active Problem List   Diagnosis Date Noted  . Cardiomyopathy, ischemic 05/13/2013  . HLD (hyperlipidemia) 09/10/2012  . CAD  s/p LAD stenting 8/13   .  Diastolic CHF (HCC)   . Hypertension     Past Surgical History:  Procedure Laterality Date  . ABDOMINAL HYSTERECTOMY    . BREAST CYST EXCISION Right 1970's  . BUNIONECTOMY Bilateral   . CARDIAC CATHETERIZATION  05/11/2013  . CARPAL TUNNEL RELEASE Right   . CATARACT EXTRACTION W/ INTRAOCULAR LENS  IMPLANT, BILATERAL    . CHOLECYSTECTOMY    . CORONARY ANGIOPLASTY WITH STENT PLACEMENT  06/2012   DES proximal LAD [name] 05/11/2013  . ESOPHAGOGASTRODUODENOSCOPY N/A 02/14/2016   Procedure: ESOPHAGOGASTRODUODENOSCOPY (EGD);  Surgeon: Malissa Hippo, MD;  Location: [facility] ENDO SUITE;  Service: Endoscopy;  Laterality: N/A;  1:40  . HAMMER TOE SURGERY Bilateral   . LEFT HEART CATHETERIZATION WITH CORONARY ANGIOGRAM N/A 07/23/2012   Procedure: LEFT HEART CATHETERIZATION WITH CORONARY ANGIOGRAM;  Surgeon: Kathleene Hazel, MD;  Location: [facility] CATH LAB;  Service: Cardiovascular;  Laterality: N/A;  . LEFT HEART CATHETERIZATION WITH CORONARY ANGIOGRAM N/A 05/11/2013   Procedure: LEFT HEART CATHETERIZATION WITH CORONARY ANGIOGRAM;  Surgeon: Kathleene Hazel, MD;  Location: [facility] CATH LAB;  Service: Cardiovascular;  Laterality: N/A;  . PERCUTANEOUS CORONARY STENT INTERVENTION (PCI-S)  07/23/2012   Procedure: PERCUTANEOUS CORONARY STENT INTERVENTION (PCI-S);  Surgeon: Kathleene Hazel, MD;  Location: [facility] CATH LAB;  Service: Cardiovascular;;  . TOE SURGERY     "don't really remember  which toe or which side; it was real crooked and had to be straightened" (05/11/2013)  . TOTAL KNEE ARTHROPLASTY Right 2000    OB History    No data available       Home Medications    Prior to Admission medications   Medication Sig Start Date End Date Taking? Authorizing Provider  atorvastatin (LIPITOR) 20 MG tablet Take 20 mg by mouth every evening.    [provider]  cephALEXin (KEFLEX) 500 MG capsule Take 1 capsule (500 mg total) by mouth 2 (two) times daily. 05/06/17   Bethann Berkshire, MD    Cholecalciferol (VITAMIN D-3) 1000 UNITS CAPS Take 5,000 Units by mouth daily.     [provider]  ELIQUIS 2.5 MG TABS tablet TAKE ONE TABLET BY MOUTH TWICE A DAY 12/09/16   Antoine Poche, MD  hydrALAZINE (APRESOLINE) 25 MG tablet Take 1 tablet (25 mg total) by mouth 3 (three) times daily. 04/29/17 07/28/17  Antoine Poche, MD  levothyroxine (SYNTHROID, LEVOTHROID) 100 MCG tablet Take 100 mcg by mouth daily before breakfast.    [provider]  Magnesium Oxide 200 MG TABS Take 200 mg by mouth 2 (two) times daily.    [provider]  metoprolol succinate (TOPROL-XL) 25 MG 24 hr tablet TAKE ONE TABLET BY MOUTH DAILY ( STOP CARVEDILOL) 03/31/17   Branch, Dorothe Pea, MD  nitroGLYCERIN (NITROSTAT) 0.4 MG SL tablet Place 0.4 mg under the tongue every 5 (five) minutes as needed for chest pain (up to 3 doses). 07/24/12   Dunn, Dayna N, PA-C  ondansetron (ZOFRAN) 4 MG tablet TAKE ONE TABLET BY MOUTH TWICE A DAY AS NEEDED FOR NAUSEA OR VOMITING 08/16/16   Setzer, Brand Males, NP  pregabalin (LYRICA) 50 MG capsule Take 50 mg by mouth 2 (two) times daily.    [provider]  psyllium (REGULOID) 0.52 G capsule Take 0.52 g by mouth 2 (two) times daily.     [provider]  torsemide (DEMADEX) 20 MG tablet Take 1 tablet (20 mg total) by mouth daily. As needed for swelling 03/28/15   Antoine Poche, MD  traMADol (ULTRAM) 50 MG tablet Take 1 tablet by mouth 2 (two) times daily. 06/20/16   [provider]  Dwyane Luo 100-62.5-25 MCG/INH AEPB  02/13/17   [provider]    Family History Family History  Problem Relation Age of Onset  . Breast cancer Mother   . Heart disease Father     Social History Social History  Substance Use Topics  . Smoking status: Never Smoker  . Smokeless tobacco: Never Used  . Alcohol use No     Allergies   Morphine and related; Sulfa antibiotics; and Sulfa drugs cross reactors   Review of Systems Review of  Systems  Respiratory: Negative for shortness of breath.   Cardiovascular: Negative for chest pain.  Gastrointestinal: Positive for abdominal pain, nausea and vomiting.  Musculoskeletal: Positive for neck pain.  Neurological: Positive for syncope. Negative for seizures.  All other systems reviewed and are negative.    Physical Exam Updated Vital Signs BP 91/69   Pulse 64   Temp 97.5 F (36.4 C)   Resp 19   Ht 5' (1.524 m)   Wt 70.3 kg (155 lb)   SpO2 96%   BMI 30.27 kg/m   Physical Exam  Constitutional: She is oriented to person, place, and time. She appears well-developed and well-nourished. No distress.  HENT:  Head: Normocephalic and atraumatic.  Right Ear: Hearing normal.  Left Ear: Hearing normal.  Nose: Nose normal.  Mouth/Throat: Oropharynx is clear and moist and mucous membranes are normal.  Eyes: Conjunctivae and EOM are normal. Pupils are equal, round, and reactive to light.  Neck: Normal range of motion. Neck supple. Muscular tenderness present.  Cardiovascular: Regular rhythm, S1 normal and S2 normal.  Exam reveals no gallop and no friction rub.   No murmur heard. Pulmonary/Chest: Effort normal and breath sounds normal. No respiratory distress. She exhibits no tenderness.  Abdominal: Soft. Normal appearance and bowel sounds are normal. There is no hepatosplenomegaly. There is generalized tenderness. There is no rebound, no guarding, no tenderness at McBurney's point and negative Murphy's sign. No hernia.  Patient with very slight diffuse tenderness, no guarding or rebound  Musculoskeletal: Normal range of motion.  Neurological: She is alert and oriented to person, place, and time. She has normal strength. No cranial nerve deficit or sensory deficit. Coordination normal. GCS eye subscore is 4. GCS verbal subscore is 5. GCS motor subscore is 6.  Skin: Skin is warm, dry and intact. No rash noted. No cyanosis.  Psychiatric: She has a normal mood and affect. Her speech  is normal and behavior is normal. Thought content normal.  Nursing note and vitals reviewed.    ED Treatments / Results  Labs (all labs ordered are listed, but only abnormal results are displayed) Labs Reviewed  CBC WITH DIFFERENTIAL/PLATELET - Abnormal; Notable for the following:       Result Value   WBC 11.9 (*)    Hemoglobin 11.9 (*)    Neutro Abs 9.6 (*)    All other components within normal limits  COMPREHENSIVE METABOLIC PANEL - Abnormal; Notable for the following:    Chloride 100 (*)    Glucose, Bld 140 (*)    BUN 41 (*)    Creatinine, Ser 2.20 (*)    GFR calc non Af Amer 19 (*)    GFR calc Af Amer 22 (*)    All other components within normal limits  URINALYSIS, ROUTINE W REFLEX MICROSCOPIC - Abnormal; Notable for the following:    Ketones, ur TRACE (*)    Protein, ur 100 (*)    Leukocytes, UA SMALL (*)    All other components within normal limits  URINALYSIS, MICROSCOPIC (REFLEX) - Abnormal; Notable for the following:    Bacteria, UA MANY (*)    Squamous Epithelial / LPF 0-5 (*)    All other components within normal limits  URINE CULTURE  LIPASE, BLOOD  I-STAT CG4 LACTIC ACID, ED  Rosezena SensorI-STAT TROPOININ, ED    EKG  EKG Interpretation  Date/Time:  Wednesday May 07 2017 02:21:56 EDT Ventricular Rate:  64 PR Interval:    QRS Duration: 153 QT Interval:  515 QTC Calculation: 532 R Axis:   -55 Text Interpretation:  Sinus rhythm Left bundle branch block Confirmed by Gilda CreasePollina, Daneisha Surges J [phone]([id number]) on 05/07/2017 2:28:27 AM       Radiology Dg Tibia/fibula Right  Result Date: 05/06/2017 CLINICAL DATA:  Tripped over chair with laceration to the lower leg. EXAM: RIGHT TIBIA AND FIBULA - 2 VIEW COMPARISON:  None. FINDINGS: Previous total knee arthroplasty. Soft tissue swelling of the prepatellar soft tissues consistent with injury. No evidence of patellar fracture. No joint fluid or air. IMPRESSION: No evidence of bone or joint injury. Soft tissue swelling anterior to the  patella and patellar tendon consistent with soft tissue injury. Electronically Signed   By: Scherrie BatemanMark  Shogry M.D.  On: 05/06/2017 17:13   Ct Head Wo Contrast  Result Date: 05/07/2017 CLINICAL DATA:  Status post fall. Tripped on chair, and hit right side of face and head. Nausea, vomiting and near syncope. Concern for cervical spine injury. Initial encounter. EXAM: CT HEAD WITHOUT CONTRAST CT CERVICAL SPINE WITHOUT CONTRAST TECHNIQUE: Multidetector CT imaging of the head and cervical spine was performed following the standard protocol without intravenous contrast. Multiplanar CT image reconstructions of the cervical spine were also generated. COMPARISON:  None. FINDINGS: CT HEAD FINDINGS Brain: No evidence of acute infarction, hemorrhage, hydrocephalus, extra-axial collection or mass lesion/mass effect. Prominence of the ventricles and sulci reflects moderate cortical volume loss. Cerebellar atrophy is noted. Scattered periventricular and subcortical white matter change likely reflects small vessel ischemic microangiopathy. The brainstem and fourth ventricle are within normal limits. The basal ganglia are unremarkable in appearance. The cerebral hemispheres demonstrate grossly normal gray-white differentiation. No mass effect or midline shift is seen. Vascular: No hyperdense vessel or unexpected calcification. Skull: There is no evidence of fracture; visualized osseous structures are unremarkable in appearance. Sinuses/Orbits: The orbits are within normal limits. The paranasal sinuses and mastoid air cells are well-aerated. Other: No significant soft tissue abnormalities are seen. CT CERVICAL SPINE FINDINGS Alignment: The patient is status post anterior cervical spinal fusion at C4-C7. There is grade 2 anterolisthesis of C4 on C5, and grade 2 anterolisthesis of C7 on T1, with underlying facet disease. Degenerative change is noted about the dens. Skull base and vertebrae: No acute fracture. No primary bone lesion or  focal pathologic process. Soft tissues and spinal canal: No prevertebral fluid or swelling. No visible canal hematoma. Disc levels: There is loss of the intervertebral disc space at C7-T1. Remaining intervertebral disc spaces are grossly preserved. Upper chest: Mild calcification is noted at the aortic arch and proximal great vessels. Scattered calcification is seen at the carotid bifurcations bilaterally. The thyroid gland is grossly unremarkable in appearance. There is minimal interstitial prominence at the lung apices. Other: No additional soft tissue abnormalities are seen. IMPRESSION: 1. No evidence of traumatic intracranial injury or fracture. 2. No evidence of acute fracture or subluxation along the cervical spine. 3. Status post anterior cervical spinal fusion at C4-C7. Underlying grade 2 anterolisthesis of C4 on C5 and of C7 on T1. 4. Moderate cortical volume loss and scattered small vessel ischemic microangiopathy. 5. Scattered calcification at the carotid bifurcations bilaterally. Carotid ultrasound would be helpful for further evaluation, when and as deemed clinically appropriate. Electronically Signed   By: Roanna Raider M.D.   On: 05/07/2017 03:04   Ct Cervical Spine Wo Contrast  Result Date: 05/07/2017 CLINICAL DATA:  Status post fall. Tripped on chair, and hit right side of face and head. Nausea, vomiting and near syncope. Concern for cervical spine injury. Initial encounter. EXAM: CT HEAD WITHOUT CONTRAST CT CERVICAL SPINE WITHOUT CONTRAST TECHNIQUE: Multidetector CT imaging of the head and cervical spine was performed following the standard protocol without intravenous contrast. Multiplanar CT image reconstructions of the cervical spine were also generated. COMPARISON:  None. FINDINGS: CT HEAD FINDINGS Brain: No evidence of acute infarction, hemorrhage, hydrocephalus, extra-axial collection or mass lesion/mass effect. Prominence of the ventricles and sulci reflects moderate cortical volume  loss. Cerebellar atrophy is noted. Scattered periventricular and subcortical white matter change likely reflects small vessel ischemic microangiopathy. The brainstem and fourth ventricle are within normal limits. The basal ganglia are unremarkable in appearance. The cerebral hemispheres demonstrate grossly normal gray-white differentiation. No mass effect or midline shift  is seen. Vascular: No hyperdense vessel or unexpected calcification. Skull: There is no evidence of fracture; visualized osseous structures are unremarkable in appearance. Sinuses/Orbits: The orbits are within normal limits. The paranasal sinuses and mastoid air cells are well-aerated. Other: No significant soft tissue abnormalities are seen. CT CERVICAL SPINE FINDINGS Alignment: The patient is status post anterior cervical spinal fusion at C4-C7. There is grade 2 anterolisthesis of C4 on C5, and grade 2 anterolisthesis of C7 on T1, with underlying facet disease. Degenerative change is noted about the dens. Skull base and vertebrae: No acute fracture. No primary bone lesion or focal pathologic process. Soft tissues and spinal canal: No prevertebral fluid or swelling. No visible canal hematoma. Disc levels: There is loss of the intervertebral disc space at C7-T1. Remaining intervertebral disc spaces are grossly preserved. Upper chest: Mild calcification is noted at the aortic arch and proximal great vessels. Scattered calcification is seen at the carotid bifurcations bilaterally. The thyroid gland is grossly unremarkable in appearance. There is minimal interstitial prominence at the lung apices. Other: No additional soft tissue abnormalities are seen. IMPRESSION: 1. No evidence of traumatic intracranial injury or fracture. 2. No evidence of acute fracture or subluxation along the cervical spine. 3. Status post anterior cervical spinal fusion at C4-C7. Underlying grade 2 anterolisthesis of C4 on C5 and of C7 on T1. 4. Moderate cortical volume loss  and scattered small vessel ischemic microangiopathy. 5. Scattered calcification at the carotid bifurcations bilaterally. Carotid ultrasound would be helpful for further evaluation, when and as deemed clinically appropriate. Electronically Signed   By: Roanna Raider M.D.   On: 05/07/2017 03:04    Procedures Procedures (including critical care time)  Medications Ordered in ED Medications  sodium chloride 0.9 % bolus 500 mL (not administered)  cefTRIAXone (ROCEPHIN) 1 g in dextrose 5 % 50 mL IVPB (not administered)     Initial Impression / Assessment and Plan / ED Course  I have reviewed the triage vital signs and the nursing notes.  Pertinent labs & imaging results that were available during my care of the patient were reviewed by me and considered in my medical decision making (see chart for details).     Patient presents to the emergency department for evaluation of syncope. Patient had an episode of brief loss of consciousness at home. She had precursor symptoms of feeling weak, dizzy, then nauseated and vomited once. She was unresponsive briefly, started to wake up and then passed out again. No seizure-like activity. She did awaken and is now back to her neurologic baseline.  Patient underwent CT head and cervical spine. She did fall and hit her head. She is on a Eliquis. CT head and cervical spine were negative for acute injury. No evidence of stroke to explain the patient's syncope.  Patient is afebrile. Her lactic acid is normal. She is not experiencing chest pain, shortness of breath or palpitations. EKG shows baseline left bundle branch block. Troponin is negative.  Patient does have a history of renal insufficiency. Creatinine is 2.2 today, well above her baseline. This is consistent with mild acute kidney injury. Urinalysis does suggest infection. She has a normal lactic acid and is not exhibiting signs of sepsis at this time. She was given fluid bolus here in the ER and initiated  on Rocephin. Urine culture pending.  Patient will require hospitalization for further observation and management of syncope in the setting of urinary tract infection and acute kidney injury in an elderly patient.  Final Clinical Impressions(s) /  ED Diagnoses   Final diagnoses:  Syncope, unspecified syncope type  AKI (acute kidney injury) [facility])    New Prescriptions New Prescriptions   No medications on file     Gilda Crease, MD 05/07/17 0600

## 2017-05-07 NOTE — Telephone Encounter (Signed)
pt will be placed on hold until she get better from her fall at Dr. Verna CzechBranch's office yesterday.

## 2017-05-07 NOTE — H&P (Signed)
History and Physical    Ana Pena ZOX:[id number] DOB: 1926/04/30 DOA: 05/07/2017  PCP: Ignatius Specking, MD  Patient coming from: home  Chief Complaint:  Passed out  HPI: Ana Pena is a 83 y.o. female with medical history significant of afib on eloquis, CKD baseline cr around 1.6 come in after syncope.  Pt reports yesterday she was following up with her cardiologist for recent discontinuation of norvasc for causing peripheral edema.  She got up when they called her name she tripped and fell and injured her knee.  A laceration was repaired.  She went home with family.  Later in the day she was lying in bed when she started to feel very nauseated.  She vomiting in the trash and then got up with help to bedside commode, when she got up to get to commode she passed out on the commode.  Her daughter was with her.  She was out for less than a minute.  Her bp was int he low 90s at this time.  No fevers.  No diarrhea.  No further vomiting.  No urinary symptoms.  No neurological deficits.  No chest pain no sob.  Pt referred for admission for syncope.   Review of Systems: As per HPI otherwise 10 point review of systems negative.   Past Medical History:  Diagnosis Date  . Abnormal CT scan    a. Question of possible renal cyst in RUQ by CT scan 06/2012 at [facility]. b. Redemonstrated 04/2013, pt instructed to f/u PCP.  [name] Arthritis    "all over" (05/11/2013)  . Asthma   . CAD (coronary artery disease)    a. s/p PTCA/DES to LAD 07/23/12. b. Mildly elevated trop 04/2013 with stable cath, no culprit.  . Chronic lower back pain   . Chronic venous insufficiency   . CKD (chronic kidney disease)   . Diastolic CHF (HCC)    a. Prior normal EF 06/2013. b. EF 40-45% by echo 04/2013.  . Diverticulosis   . GERD (gastroesophageal reflux disease)   . H/O hiatal hernia   . Hypercholesteremia   . Hypertension   . Hypothyroidism   . Iron deficiency anemia   . LBBB (left bundle branch block)     Past Surgical  History:  Procedure Laterality Date  . ABDOMINAL HYSTERECTOMY    . BREAST CYST EXCISION Right 1970's  . BUNIONECTOMY Bilateral   . CARDIAC CATHETERIZATION  05/11/2013  . CARPAL TUNNEL RELEASE Right   . CATARACT EXTRACTION W/ INTRAOCULAR LENS  IMPLANT, BILATERAL    . CHOLECYSTECTOMY    . CORONARY ANGIOPLASTY WITH STENT PLACEMENT  06/2012   DES proximal LAD [name] 05/11/2013  . ESOPHAGOGASTRODUODENOSCOPY N/A 02/14/2016   Procedure: ESOPHAGOGASTRODUODENOSCOPY (EGD);  Surgeon: Malissa Hippo, MD;  Location: [facility];  Service: Endoscopy;  Laterality: N/A;  1:40  . HAMMER TOE SURGERY Bilateral   . LEFT HEART CATHETERIZATION WITH CORONARY ANGIOGRAM N/A 07/23/2012   Procedure: LEFT HEART CATHETERIZATION WITH CORONARY ANGIOGRAM;  Surgeon: Kathleene Hazel, MD;  Location: [facility] CATH LAB;  Service: Cardiovascular;  Laterality: N/A;  . LEFT HEART CATHETERIZATION WITH CORONARY ANGIOGRAM N/A 05/11/2013   Procedure: LEFT HEART CATHETERIZATION WITH CORONARY ANGIOGRAM;  Surgeon: Kathleene Hazel, MD;  Location: [facility] CATH LAB;  Service: Cardiovascular;  Laterality: N/A;  . PERCUTANEOUS CORONARY STENT INTERVENTION (PCI-S)  07/23/2012   Procedure: PERCUTANEOUS CORONARY STENT INTERVENTION (PCI-S);  Surgeon: Kathleene Hazel, MD;  Location: [facility] CATH LAB;  Service: Cardiovascular;;  . TOE SURGERY     "  don't really remember which toe or which side; it was real crooked and had to be straightened" (05/11/2013)  . TOTAL KNEE ARTHROPLASTY Right 2000     reports that she has never smoked. She has never used smokeless tobacco. She reports that she does not drink alcohol or use drugs.  Allergies  Allergen Reactions  . Morphine And Related Nausea Only  . Sulfa Antibiotics     unknown  . Sulfa Drugs Cross Reactors     unknown    Family History  Problem Relation Age of Onset  . Breast cancer Mother   . Heart disease Father     Prior to Admission medications   Medication Sig Start Date End Date  Taking? Authorizing Provider  atorvastatin (LIPITOR) 20 MG tablet Take 20 mg by mouth every evening.    [provider]  cephALEXin (KEFLEX) 500 MG capsule Take 1 capsule (500 mg total) by mouth 2 (two) times daily. 05/06/17   Bethann Berkshire, MD  Cholecalciferol (VITAMIN D-3) 1000 UNITS CAPS Take 5,000 Units by mouth daily.     [provider]  ELIQUIS 2.5 MG TABS tablet TAKE ONE TABLET BY MOUTH TWICE A DAY 12/09/16   Antoine Poche, MD  hydrALAZINE (APRESOLINE) 25 MG tablet Take 1 tablet (25 mg total) by mouth 3 (three) times daily. 04/29/17 07/28/17  Antoine Poche, MD  levothyroxine (SYNTHROID, LEVOTHROID) 100 MCG tablet Take 100 mcg by mouth daily before breakfast.    [provider]  Magnesium Oxide 200 MG TABS Take 200 mg by mouth 2 (two) times daily.    [provider]  metoprolol succinate (TOPROL-XL) 25 MG 24 hr tablet TAKE ONE TABLET BY MOUTH DAILY ( STOP CARVEDILOL) 03/31/17   Branch, Dorothe Pea, MD  nitroGLYCERIN (NITROSTAT) 0.4 MG SL tablet Place 0.4 mg under the tongue every 5 (five) minutes as needed for chest pain (up to 3 doses). 07/24/12   Dunn, Dayna N, PA-C  ondansetron (ZOFRAN) 4 MG tablet TAKE ONE TABLET BY MOUTH TWICE A DAY AS NEEDED FOR NAUSEA OR VOMITING 08/16/16   Setzer, Brand Males, NP  pregabalin (LYRICA) 50 MG capsule Take 50 mg by mouth 2 (two) times daily.    [provider]  psyllium (REGULOID) 0.52 G capsule Take 0.52 g by mouth 2 (two) times daily.     [provider]  torsemide (DEMADEX) 20 MG tablet Take 1 tablet (20 mg total) by mouth daily. As needed for swelling 03/28/15   Antoine Poche, MD  traMADol (ULTRAM) 50 MG tablet Take 1 tablet by mouth 2 (two) times daily. 06/20/16   [provider]  Dwyane Luo 100-62.5-25 MCG/INH AEPB  02/13/17   [provider]    Physical Exam: Vitals:   05/07/17 0214 05/07/17 0216 05/07/17 0500  BP:  (!) 121/58 91/69  Pulse:  64   Resp:  18 19  Temp:   97.5 F (36.4 C)   SpO2:  96%   Weight: 70.3 kg (155 lb)    Height: 5' (1.524 m)      Constitutional: NAD, calm, comfortable Vitals:   05/07/17 0214 05/07/17 0216 05/07/17 0500  BP:  (!) 121/58 91/69  Pulse:  64   Resp:  18 19  Temp:  97.5 F (36.4 C)   SpO2:  96%   Weight: 70.3 kg (155 lb)    Height: 5' (1.524 m)     Eyes: PERRL, lids and conjunctivae normal ENMT: Mucous membranes are moist. Posterior pharynx clear  of any exudate or lesions.Normal dentition.  Neck: normal, supple, no masses, no thyromegaly Respiratory: clear to auscultation bilaterally, no wheezing, no crackles. Normal respiratory effort. No accessory muscle use.  Cardiovascular: Regular rate and rhythm, no murmurs / rubs / gallops. 1+ extremity edema. 2+ pedal pulses. No carotid bruits.  Abdomen: no tenderness, no masses palpated. No hepatosplenomegaly. Bowel sounds positive.  Musculoskeletal: no clubbing / cyanosis. No joint deformity upper and lower extremities. Good ROM, no contractures. Normal muscle tone.  Skin: no rashes, lesions, ulcers. No induration Neurologic: CN 2-12 grossly intact. Sensation intact, DTR normal. Strength 5/5 in all 4.  Psychiatric: Normal judgment and insight. Alert and oriented x 3. Normal mood.    Labs on Admission: I have personally reviewed following labs and imaging studies  CBC:  Recent Labs Lab 05/07/17 0317  WBC 11.9*  NEUTROABS 9.6*  HGB 11.9*  HCT 36.2  MCV 92.8  PLT 206   Basic Metabolic Panel:  Recent Labs Lab 05/07/17 0317  NA 138  K 4.3  CL 100*  CO2 27  GLUCOSE 140*  BUN 41*  CREATININE 2.20*  CALCIUM 9.2   GFR: Estimated Creatinine Clearance: 15.5 mL/min (A) (by C-G formula based on SCr of 2.2 mg/dL (H)). Liver Function Tests:  Recent Labs Lab 05/07/17 0317  AST 25  ALT 21  ALKPHOS 58  BILITOT 0.9  PROT 6.5  ALBUMIN 3.8    Recent Labs Lab 05/07/17 0317  LIPASE 21    Urine analysis:    Component Value Date/Time   COLORURINE  YELLOW 05/07/2017 0226   APPEARANCEUR CLEAR 05/07/2017 0226   LABSPEC 1.025 05/07/2017 0226   PHURINE 5.5 05/07/2017 0226   GLUCOSEU NEGATIVE 05/07/2017 0226   HGBUR NEGATIVE 05/07/2017 0226   BILIRUBINUR NEGATIVE 05/07/2017 0226   KETONESUR TRACE (A) 05/07/2017 0226   PROTEINUR 100 (A) 05/07/2017 0226   NITRITE NEGATIVE 05/07/2017 0226   LEUKOCYTESUR SMALL (A) 05/07/2017 0226      Radiological Exams on Admission: Dg Tibia/fibula Right  Result Date: 05/06/2017 CLINICAL DATA:  Tripped over chair with laceration to the lower leg. EXAM: RIGHT TIBIA AND FIBULA - 2 VIEW COMPARISON:  None. FINDINGS: Previous total knee arthroplasty. Soft tissue swelling of the prepatellar soft tissues consistent with injury. No evidence of patellar fracture. No joint fluid or air. IMPRESSION: No evidence of bone or joint injury. Soft tissue swelling anterior to the patella and patellar tendon consistent with soft tissue injury. Electronically Signed   By: Paulina FusiMark  Shogry M.D.   On: 05/06/2017 17:13   Ct Head Wo Contrast  Result Date: 05/07/2017 CLINICAL DATA:  Status post fall. Tripped on chair, and hit right side of face and head. Nausea, vomiting and near syncope. Concern for cervical spine injury. Initial encounter. EXAM: CT HEAD WITHOUT CONTRAST CT CERVICAL SPINE WITHOUT CONTRAST TECHNIQUE: Multidetector CT imaging of the head and cervical spine was performed following the standard protocol without intravenous contrast. Multiplanar CT image reconstructions of the cervical spine were also generated. COMPARISON:  None. FINDINGS: CT HEAD FINDINGS Brain: No evidence of acute infarction, hemorrhage, hydrocephalus, extra-axial collection or mass lesion/mass effect. Prominence of the ventricles and sulci reflects moderate cortical volume loss. Cerebellar atrophy is noted. Scattered periventricular and subcortical white matter change likely reflects small vessel ischemic microangiopathy. The brainstem and fourth ventricle  are within normal limits. The basal ganglia are unremarkable in appearance. The cerebral hemispheres demonstrate grossly normal gray-white differentiation. No mass effect or midline shift is seen. Vascular: No hyperdense vessel or  unexpected calcification. Skull: There is no evidence of fracture; visualized osseous structures are unremarkable in appearance. Sinuses/Orbits: The orbits are within normal limits. The paranasal sinuses and mastoid air cells are well-aerated. Other: No significant soft tissue abnormalities are seen. CT CERVICAL SPINE FINDINGS Alignment: The patient is status post anterior cervical spinal fusion at C4-C7. There is grade 2 anterolisthesis of C4 on C5, and grade 2 anterolisthesis of C7 on T1, with underlying facet disease. Degenerative change is noted about the dens. Skull base and vertebrae: No acute fracture. No primary bone lesion or focal pathologic process. Soft tissues and spinal canal: No prevertebral fluid or swelling. No visible canal hematoma. Disc levels: There is loss of the intervertebral disc space at C7-T1. Remaining intervertebral disc spaces are grossly preserved. Upper chest: Mild calcification is noted at the aortic arch and proximal great vessels. Scattered calcification is seen at the carotid bifurcations bilaterally. The thyroid gland is grossly unremarkable in appearance. There is minimal interstitial prominence at the lung apices. Other: No additional soft tissue abnormalities are seen. IMPRESSION: 1. No evidence of traumatic intracranial injury or fracture. 2. No evidence of acute fracture or subluxation along the cervical spine. 3. Status post anterior cervical spinal fusion at C4-C7. Underlying grade 2 anterolisthesis of C4 on C5 and of C7 on T1. 4. Moderate cortical volume loss and scattered small vessel ischemic microangiopathy. 5. Scattered calcification at the carotid bifurcations bilaterally. Carotid ultrasound would be helpful for further evaluation, when and  as deemed clinically appropriate. Electronically Signed   By: Roanna Raider M.D.   On: 05/07/2017 03:04   Ct Cervical Spine Wo Contrast  Result Date: 05/07/2017 CLINICAL DATA:  Status post fall. Tripped on chair, and hit right side of face and head. Nausea, vomiting and near syncope. Concern for cervical spine injury. Initial encounter. EXAM: CT HEAD WITHOUT CONTRAST CT CERVICAL SPINE WITHOUT CONTRAST TECHNIQUE: Multidetector CT imaging of the head and cervical spine was performed following the standard protocol without intravenous contrast. Multiplanar CT image reconstructions of the cervical spine were also generated. COMPARISON:  None. FINDINGS: CT HEAD FINDINGS Brain: No evidence of acute infarction, hemorrhage, hydrocephalus, extra-axial collection or mass lesion/mass effect. Prominence of the ventricles and sulci reflects moderate cortical volume loss. Cerebellar atrophy is noted. Scattered periventricular and subcortical white matter change likely reflects small vessel ischemic microangiopathy. The brainstem and fourth ventricle are within normal limits. The basal ganglia are unremarkable in appearance. The cerebral hemispheres demonstrate grossly normal gray-white differentiation. No mass effect or midline shift is seen. Vascular: No hyperdense vessel or unexpected calcification. Skull: There is no evidence of fracture; visualized osseous structures are unremarkable in appearance. Sinuses/Orbits: The orbits are within normal limits. The paranasal sinuses and mastoid air cells are well-aerated. Other: No significant soft tissue abnormalities are seen. CT CERVICAL SPINE FINDINGS Alignment: The patient is status post anterior cervical spinal fusion at C4-C7. There is grade 2 anterolisthesis of C4 on C5, and grade 2 anterolisthesis of C7 on T1, with underlying facet disease. Degenerative change is noted about the dens. Skull base and vertebrae: No acute fracture. No primary bone lesion or focal pathologic  process. Soft tissues and spinal canal: No prevertebral fluid or swelling. No visible canal hematoma. Disc levels: There is loss of the intervertebral disc space at C7-T1. Remaining intervertebral disc spaces are grossly preserved. Upper chest: Mild calcification is noted at the aortic arch and proximal great vessels. Scattered calcification is seen at the carotid bifurcations bilaterally. The thyroid gland is grossly unremarkable  in appearance. There is minimal interstitial prominence at the lung apices. Other: No additional soft tissue abnormalities are seen. IMPRESSION: 1. No evidence of traumatic intracranial injury or fracture. 2. No evidence of acute fracture or subluxation along the cervical spine. 3. Status post anterior cervical spinal fusion at C4-C7. Underlying grade 2 anterolisthesis of C4 on C5 and of C7 on T1. 4. Moderate cortical volume loss and scattered small vessel ischemic microangiopathy. 5. Scattered calcification at the carotid bifurcations bilaterally. Carotid ultrasound would be helpful for further evaluation, when and as deemed clinically appropriate. Electronically Signed   By: Roanna Raider M.D.   On: 05/07/2017 03:04   Dg Chest Port 1 View  Result Date: 05/07/2017 CLINICAL DATA:  Status post fall, with nausea, vomiting and syncope. Initial encounter. EXAM: PORTABLE CHEST 1 VIEW COMPARISON:  Chest radiograph performed 03/20/2017 FINDINGS: There is elevation of the right hemidiaphragm. There is no evidence of focal opacification, pleural effusion or pneumothorax. The cardiomediastinal silhouette is borderline normal in size. No acute osseous abnormalities are seen. IMPRESSION: Elevation of the right hemidiaphragm.  Lungs remain grossly clear. Electronically Signed   By: Roanna Raider M.D.   On: 05/07/2017 05:55    EKG: Independently reviewed.LBBB old c/w to old ekgs   Assessment/Plan 83 yo female with syncopal episode  Principal Problem:   Syncope- suspect this is due to  othostasis.  Will hold her bp meds.  Getting 500cc ivf bolus now.  Cannot do orthostatic vitals as she is having a hard time standing with her knee injury.  Place on tele monitoring.  Obtain echo.  Serial trop.    Active Problems:   CAD  s/p LAD stenting 8/13- stable   Diastolic CHF (HCC)- compensated at this time, holding lasix   Hypertension- holding all bp meds, bp soft in ED   Cardiomyopathy, ischemic- noted   UTI (urinary tract infection) possible - rocephin, uc pending     DVT prophylaxis: eloquis  Code Status: full Family Communication:  daughter  Disposition Plan:  Per day team Consults called:  none Admission status:  observation   Carly Sabo A MD [facility]  If 7PM-7AM, please contact night-coverage [email or web address] Password [email or web address]  05/07/2017, 6:01 AM

## 2017-05-07 NOTE — ED Triage Notes (Signed)
Pt c/o n/v and syncopal episodes since being discharged this evening.

## 2017-05-07 NOTE — Care Management Obs Status (Signed)
MEDICARE OBSERVATION STATUS NOTIFICATION   Patient Details  Name: Ana RouxMarie W Hoeg MRN: [id number] Date of Birth: 08/24/1926   Medicare Observation Status Notification Given:  Yes    Harsimran Westman, Chrystine OilerSharley Diane, RN 05/07/2017, 10:52 AM

## 2017-05-07 NOTE — Progress Notes (Signed)
*  PRELIMINARY RESULTS* Echocardiogram 2D Echocardiogram has been performed.  Stacey DrainWhite, Jentry Mcqueary J 05/07/2017, 4:00 PM

## 2017-05-07 NOTE — Progress Notes (Signed)
Patient admitted to the hospital earlier this morning by Dr. Onalee Huaavid.  Patient seen and examined. Denies any lightheadedness or dizziness. No chest pain or shortness of breath.  She was admitted to the hospital after a fall and syncopal event. She suffered a laceration to her right knee that was repaired in the emergency room. She was noted to be somewhat dehydrated and started on IV fluids. Echocardiogram has been ordered. Urinalysis indicated a possible infection. She is on Rocephin and urine culture have been ordered. Physical therapy evaluation has been requested. Anticipate discharge in the next 24 hours.

## 2017-05-07 NOTE — ED Notes (Signed)
Patient's EKG done and seen by Dr Blinda LeatherwoodPollina

## 2017-05-08 ENCOUNTER — Ambulatory Visit (HOSPITAL_COMMUNITY): Payer: Medicare Other

## 2017-05-08 ENCOUNTER — Observation Stay (HOSPITAL_COMMUNITY): Payer: Medicare Other

## 2017-05-08 DIAGNOSIS — S81011A Laceration without foreign body, right knee, initial encounter: Secondary | ICD-10-CM | POA: Diagnosis present

## 2017-05-08 DIAGNOSIS — N183 Chronic kidney disease, stage 3 unspecified: Secondary | ICD-10-CM | POA: Diagnosis present

## 2017-05-08 DIAGNOSIS — I5030 Unspecified diastolic (congestive) heart failure: Secondary | ICD-10-CM

## 2017-05-08 DIAGNOSIS — Z23 Encounter for immunization: Secondary | ICD-10-CM | POA: Diagnosis present

## 2017-05-08 DIAGNOSIS — I951 Orthostatic hypotension: Secondary | ICD-10-CM | POA: Diagnosis not present

## 2017-05-08 DIAGNOSIS — Z955 Presence of coronary angioplasty implant and graft: Secondary | ICD-10-CM | POA: Diagnosis not present

## 2017-05-08 DIAGNOSIS — J441 Chronic obstructive pulmonary disease with (acute) exacerbation: Secondary | ICD-10-CM | POA: Diagnosis not present

## 2017-05-08 DIAGNOSIS — I872 Venous insufficiency (chronic) (peripheral): Secondary | ICD-10-CM | POA: Diagnosis not present

## 2017-05-08 DIAGNOSIS — E860 Dehydration: Secondary | ICD-10-CM | POA: Diagnosis not present

## 2017-05-08 DIAGNOSIS — I480 Paroxysmal atrial fibrillation: Secondary | ICD-10-CM

## 2017-05-08 DIAGNOSIS — N179 Acute kidney failure, unspecified: Secondary | ICD-10-CM

## 2017-05-08 DIAGNOSIS — Z96651 Presence of right artificial knee joint: Secondary | ICD-10-CM | POA: Diagnosis not present

## 2017-05-08 DIAGNOSIS — Z961 Presence of intraocular lens: Secondary | ICD-10-CM | POA: Diagnosis present

## 2017-05-08 DIAGNOSIS — R062 Wheezing: Secondary | ICD-10-CM | POA: Diagnosis not present

## 2017-05-08 DIAGNOSIS — K219 Gastro-esophageal reflux disease without esophagitis: Secondary | ICD-10-CM | POA: Diagnosis not present

## 2017-05-08 DIAGNOSIS — E039 Hypothyroidism, unspecified: Secondary | ICD-10-CM | POA: Diagnosis not present

## 2017-05-08 DIAGNOSIS — N390 Urinary tract infection, site not specified: Secondary | ICD-10-CM | POA: Diagnosis not present

## 2017-05-08 DIAGNOSIS — Z981 Arthrodesis status: Secondary | ICD-10-CM | POA: Diagnosis not present

## 2017-05-08 DIAGNOSIS — I447 Left bundle-branch block, unspecified: Secondary | ICD-10-CM | POA: Diagnosis not present

## 2017-05-08 DIAGNOSIS — I2510 Atherosclerotic heart disease of native coronary artery without angina pectoris: Secondary | ICD-10-CM | POA: Diagnosis not present

## 2017-05-08 DIAGNOSIS — D62 Acute posthemorrhagic anemia: Secondary | ICD-10-CM | POA: Diagnosis not present

## 2017-05-08 DIAGNOSIS — R55 Syncope and collapse: Secondary | ICD-10-CM | POA: Diagnosis present

## 2017-05-08 DIAGNOSIS — E7800 Pure hypercholesterolemia, unspecified: Secondary | ICD-10-CM | POA: Diagnosis not present

## 2017-05-08 DIAGNOSIS — I255 Ischemic cardiomyopathy: Secondary | ICD-10-CM | POA: Diagnosis not present

## 2017-05-08 DIAGNOSIS — I5032 Chronic diastolic (congestive) heart failure: Secondary | ICD-10-CM | POA: Diagnosis not present

## 2017-05-08 DIAGNOSIS — J45909 Unspecified asthma, uncomplicated: Secondary | ICD-10-CM | POA: Diagnosis not present

## 2017-05-08 DIAGNOSIS — D649 Anemia, unspecified: Secondary | ICD-10-CM

## 2017-05-08 DIAGNOSIS — W010XXA Fall on same level from slipping, tripping and stumbling without subsequent striking against object, initial encounter: Secondary | ICD-10-CM | POA: Diagnosis not present

## 2017-05-08 DIAGNOSIS — M1990 Unspecified osteoarthritis, unspecified site: Secondary | ICD-10-CM | POA: Diagnosis not present

## 2017-05-08 DIAGNOSIS — I130 Hypertensive heart and chronic kidney disease with heart failure and stage 1 through stage 4 chronic kidney disease, or unspecified chronic kidney disease: Secondary | ICD-10-CM | POA: Diagnosis not present

## 2017-05-08 LAB — CBC
HCT: 26.8 % — ABNORMAL LOW (ref 36.0–46.0)
HEMATOCRIT: 28.6 % — AB (ref 36.0–46.0)
HEMOGLOBIN: 8.8 g/dL — AB (ref 12.0–15.0)
Hemoglobin: 9.5 g/dL — ABNORMAL LOW (ref 12.0–15.0)
MCH: 30.8 pg (ref 26.0–34.0)
MCH: 30.9 pg (ref 26.0–34.0)
MCHC: 32.8 g/dL (ref 30.0–36.0)
MCHC: 33.2 g/dL (ref 30.0–36.0)
MCV: 93.7 fL (ref 78.0–100.0)
MCV: 93.2 fL (ref 78.0–100.0)
PLATELETS: 167 10*3/uL (ref 150–400)
Platelets: 187 10*3/uL (ref 150–400)
RBC: 2.86 MIL/uL — ABNORMAL LOW (ref 3.87–5.11)
RBC: 3.07 MIL/uL — ABNORMAL LOW (ref 3.87–5.11)
RDW: 15.1 % (ref 11.5–15.5)
RDW: 15.1 % (ref 11.5–15.5)
WBC: 8.5 10*3/uL (ref 4.0–10.5)
WBC: 11 10*3/uL — AB (ref 4.0–10.5)

## 2017-05-08 LAB — BASIC METABOLIC PANEL
ANION GAP: 6 (ref 5–15)
BUN: 51 mg/dL — ABNORMAL HIGH (ref 6–20)
CALCIUM: 7.9 mg/dL — AB (ref 8.9–10.3)
CHLORIDE: 103 mmol/L (ref 101–111)
CO2: 25 mmol/L (ref 22–32)
CREATININE: 2.46 mg/dL — AB (ref 0.44–1.00)
GFR calc Af Amer: 19 mL/min — ABNORMAL LOW (ref >60)
GFR calc non Af Amer: 16 mL/min — ABNORMAL LOW (ref >60)
Glucose, Bld: 115 mg/dL — ABNORMAL HIGH (ref 65–99)
Potassium: 4.5 mmol/L (ref 3.5–5.1)
Sodium: 134 mmol/L — ABNORMAL LOW (ref 135–145)

## 2017-05-08 LAB — URINE CULTURE: Culture: NO GROWTH

## 2017-05-08 MED ORDER — IPRATROPIUM-ALBUTEROL 0.5-2.5 (3) MG/3ML IN SOLN
3.0000 mL | Freq: Four times a day (QID) | RESPIRATORY_TRACT | Status: DC
  Administered 2017-05-08: 3 mL via RESPIRATORY_TRACT
  Filled 2017-05-08: qty 3

## 2017-05-08 MED ORDER — GUAIFENESIN ER 600 MG PO TB12
1200.0000 mg | ORAL_TABLET | Freq: Two times a day (BID) | ORAL | Status: DC
  Administered 2017-05-08 – 2017-05-10 (×4): 1200 mg via ORAL
  Filled 2017-05-08 (×4): qty 2

## 2017-05-08 MED ORDER — IPRATROPIUM-ALBUTEROL 0.5-2.5 (3) MG/3ML IN SOLN
3.0000 mL | Freq: Three times a day (TID) | RESPIRATORY_TRACT | Status: DC
  Administered 2017-05-09 – 2017-05-10 (×5): 3 mL via RESPIRATORY_TRACT
  Filled 2017-05-08 (×5): qty 3

## 2017-05-08 MED ORDER — IPRATROPIUM-ALBUTEROL 0.5-2.5 (3) MG/3ML IN SOLN: 3.0000 mL | Freq: Four times a day (QID) | RESPIRATORY_TRACT | Status: DC

## 2017-05-08 MED ORDER — FUROSEMIDE 10 MG/ML IJ SOLN
40.0000 mg | Freq: Two times a day (BID) | INTRAMUSCULAR | Status: DC
  Administered 2017-05-08 – 2017-05-10 (×4): 40 mg via INTRAVENOUS
  Filled 2017-05-08 (×4): qty 4

## 2017-05-08 MED ORDER — TRAZODONE HCL 50 MG PO TABS
50.0000 mg | ORAL_TABLET | Freq: Every evening | ORAL | Status: DC | PRN
  Administered 2017-05-08: 50 mg via ORAL
  Filled 2017-05-08: qty 1

## 2017-05-08 NOTE — Care Management Note (Addendum)
Case Management Note  Patient Details  Name: Ana Pena MRN: [id number] Date of Birth: 12/20/1925  Subjective/Objective:  Adm from home with syncopal episode. Son at bedside offering information.  Daughter lives with patient. She has cane, RW, and WC  PTA. She has an aide 32 hours/week. She has a PCP (Dr Sherril CroonVyas), family drives her to appointments. She has [place] and [place].   Reports no issues affording medications.                 Action/Plan: Anticipate return home with care of family. Did discuss HH with son. Patient has had HH in the past, unsure of agency. CM will follow for needs.   Expected Discharge Date:       05/09/2017           Expected Discharge Plan:     In-House Referral:     Discharge planning Services  CM Consult  Post Acute Care Choice:    Choice offered to:     DME Arranged:    DME Agency:     HH Arranged:    HH Agency:     Status of Service:  In process, will continue to follow  If discussed at Long Length of Stay Meetings, dates discussed:    Additional Comments:  Erie Sica, Chrystine OilerSharley Diane, RN 05/08/2017, 7:33 AM

## 2017-05-08 NOTE — Care Management Note (Signed)
Case Management Note  Patient Details  Name: Eduard RouxMarie W Goldston MRN: [id number] Date of Birth: 02/08/1926  Expected Discharge Date:       05/08/2017           Expected Discharge Plan:  Home/Self Care  In-House Referral:  NA  Discharge planning Services  CM Consult  Post Acute Care Choice:  NA Choice offered to:  NA  Status of Service:  Completed, signed off  Additional Comments: Pt seen by PT, no needs at DC.   Malcolm Metrohildress, Amorah Sebring Demske, RN 05/08/2017, 3:14 PM

## 2017-05-08 NOTE — Evaluation (Signed)
Physical Therapy Evaluation Patient Details Name: Ana Pena MRN: [id number] DOB: [birth year] Today's Date: 05/08/2017   History of Present Illness  Ana Pena is n 83yo female who comes to [facility] on 6/12 after trip and fall at cardiologists office, with knee abrasion. Pt with subsequent syncopal episode assoicated with dizziness and emesis, pt foudn to be orthostatic. PMH: AF on eliquis, CKD, CAD sp LAD stent, DCHF, HTN, ICM, UTI. Pt is a community AMB with rollator, 3 prior mechanical falls (denies syncope) in the pat 6 months, and has been working with [facility] since 04/02/17.   Clinical Impression  Pt admitted with above diagnosis. Pt currently with functional limitations due to the deficits listed below (see "PT Problem List"). Upon entry, the patient is received semirecumbent in bed, daughter present. The pt is awake and agreeable to participate. No acute distress noted at this time, pt only reporting some mild, nonconcerning pain from knee abrasion. VSS during eval, mild BP drop with standing, not orthostatic, but improved with AMB, no giddiness.  Functional mobility assessment demonstrates mild-moderate weakness with balance deficits, and limited activity tolerance, as per her baseline, but now with more limitation due to skin tear s/p recent fall. Pt has nearly returned to her baseline. Pt will benefit from skilled PT intervention to increase independence and safety with basic mobility in preparation for discharge to the venue listed below.       Follow Up Recommendations Outpatient PT (continue with [facility]; certified through middle of June, has done 7 of 12 visits.)    Equipment Recommendations  None recommended by PT    Recommendations for Other Services       Precautions / Restrictions Precautions Precautions: None Restrictions Weight Bearing Restrictions: No      Mobility  Bed Mobility Overal bed mobility: Modified Independent                Transfers Overall transfer  level: Modified independent Equipment used: Rolling walker (2 wheeled)                Ambulation/Gait Ambulation/Gait assistance: Min guard Ambulation Distance (Feet): 160 Feet Assistive device: Rolling walker (2 wheeled) Gait Pattern/deviations: Step-through pattern Gait velocity: 0.76m/s  Gait velocity interpretation: <1.8 ft/sec, indicative of risk for recurrent falls    Stairs            Wheelchair Mobility    Modified Rankin (Stroke Patients Only)       Balance Overall balance assessment: Modified Independent;History of Falls;Needs assistance                                           Pertinent Vitals/Pain Pain Assessment: 0-10 Pain Score: 2  Pain Location: left knee pain  Pain Intervention(s): Limited activity within patient's tolerance;Monitored during session    Home Living Family/patient expects to be discharged to:: Private residence Living Arrangements: Children Available Help at Discharge: Family Type of Home: House Home Access: Ramped entrance     Home Layout: One level;Laundry or work area in [employer] Equipment: Environmental consultant - 2 wheels;Walker - 4 wheels;Cane - single point      Prior Function Level of Independence: Independent with assistive device(s)   Gait / Transfers Assistance Needed: [phone] for community distances   ADL's / Homemaking Assistance Needed: daughter assists with medications and meals         Hand Dominance  Extremity/Trunk Assessment        Lower Extremity Assessment Lower Extremity Assessment:  (pedal edema per baseline, bruising BLE )    Cervical / Trunk Assessment Cervical / Trunk Assessment: Normal  Communication   Communication: No difficulties  Cognition Arousal/Alertness: Awake/alert Behavior During Therapy: WFL for tasks assessed/performed Overall Cognitive Status: Within Functional Limits for tasks assessed                                        General  Comments      Exercises     Assessment/Plan    PT Assessment Patient needs continued PT services  PT Problem List Decreased strength;Decreased activity tolerance;Decreased balance;Pain;Decreased skin integrity;Cardiopulmonary status limiting activity       PT Treatment Interventions Gait training;Functional mobility training;Therapeutic activities;Therapeutic exercise;Balance training;Patient/family education    PT Goals (Current goals can be found in the Care Plan section)  Acute Rehab PT Goals Patient Stated Goal: Decrease falls  PT Goal Formulation: With patient Time For Goal Achievement: 05/22/17 Potential to Achieve Goals: Good    Frequency Min 2X/week   Barriers to discharge        Co-evaluation               AM-PAC PT "6 Clicks" Daily Activity  Outcome Measure Difficulty turning over in bed (including adjusting bedclothes, sheets and blankets)?: None Difficulty moving from lying on back to sitting on the side of the bed? : None Difficulty sitting down on and standing up from a chair with arms (e.g., wheelchair, bedside commode, etc,.)?: A Little Help needed moving to and from a bed to chair (including a wheelchair)?: A Little Help needed walking in hospital room?: A Little Help needed climbing 3-5 steps with a railing? : A Lot 6 Click Score: 19    End of Session Equipment Utilized During Treatment: Gait belt Activity Tolerance: No increased pain;Patient tolerated treatment well;Patient limited by pain Patient left: in bed;with bed alarm set;with family/visitor present;with call bell/phone within reach Nurse Communication: Mobility status PT Visit Diagnosis: History of falling (Z91.81);Dizziness and giddiness (R42);Other abnormalities of gait and mobility (R26.89)    Time: [id number] PT Time Calculation (min) (ACUTE ONLY): 22 min   Charges:   PT Evaluation $PT Eval Moderate Complexity: 1 Procedure PT Treatments $Therapeutic Activity: 8-22 mins   PT G  Codes:   PT G-Codes **NOT FOR INPATIENT CLASS** Functional Assessment Tool Used: AM-PAC 6 Clicks Basic Mobility Functional Limitation: Mobility: Walking and moving around Mobility: Walking and Moving Around Current Status ([id number]([id number]): At least 40 percent but less than 60 percent impaired, limited or restricted Mobility: Walking and Moving Around Goal Status [phone](G8979): At least 40 percent but less than 60 percent impaired, limited or restricted    2:11 PM, 05/08/17 Rosamaria LintsAllan C Laurine Kuyper, PT, DPT Physical Therapist - [place] [phone](ASCOM)  [phone] (Office)    Tityana Pagan C 05/08/2017, 2:09 PM

## 2017-05-08 NOTE — Progress Notes (Signed)
PROGRESS NOTE    Ana Pena  ZOX:[id number] DOB: 10/13/1926 DOA: 05/07/2017 PCP: Ignatius Specking, MD    Brief Narrative:  83 year old female with a history of atrial fibrillation, chronic kidney disease, was at her cardiologist's office on the day of admission when she had a fall. She suffered a laceration to her right knee that was repaired in the emergency room. After returning home, she became increasingly nauseous and started vomiting. She then passed out on her commode. She was admitted to the hospital for further evaluation.   Assessment & Plan:   Principal Problem:   Syncope Active Problems:   CAD  s/p LAD stenting 8/13   Diastolic CHF (HCC)   Hypertension   Cardiomyopathy, ischemic   UTI (urinary tract infection)   AKI (acute kidney injury) (HCC)   CKD (chronic kidney disease) stage 3, GFR 30-59 ml/min   Laceration of right knee   Anemia   1. Syncope. Suspect this is related to orthostasis. She has not had any further symptoms. We'll recheck orthostatics. Echocardiogram is unremarkable. Cardiac enzymes have been negative. 2. Urinary tract infection. Patient was felt to have possible urinary tract infection. She was started on Rocephin. Urine culture has shown no growth, so we will discontinue further antibiotics. 3. Fall with right knee laceration. This was sutured in the emergency room. She has a significant bruising and hematoma around the left knee. X-rays of the knee were unremarkable. 4. Acute kidney injury on chronic kidney disease stage III. Creatinine has been trending up despite receiving IV fluids. She does have evidence of lower extremity edema and volume overload. Will start patient on IV Lasix and follow creatinine. 5. Anemia, possibly acute blood loss. She has has had a significant decline in hemoglobin since admission. She's not had any frank bleeding. Will check stool for occult blood. This was supplied loss include bleeding into her right leg around her knee  joint. Follow serial hemoglobin . if hemoglobin continues to decline, may need to stop all eliquis 6. Paroxysmal atrial fibrillation. Currently in sinus rhythm. Anticoagulated with eliquis 7. Chronic diastolic congestive heart failure. No evidence of pulmonary edema on chest x-ray. Continue current treatments.   DVT prophylaxis: eliquis Code Status: Full code Family Communication: Discussed with family at bedside Disposition Plan: Discharge home once improved.   Consultants:     Procedures:  Echo: - Left ventricle: The cavity size was normal. Wall thickness was   increased in a pattern of moderate LVH. Systolic function was   normal. The estimated ejection fraction was in the range of 55%   to 60%. Wall motion was normal; there were no regional wall   motion abnormalities. Doppler parameters are consistent with   abnormal left ventricular relaxation (grade 1 diastolic   dysfunction). - Aortic valve: Mildly calcified annulus. Trileaflet; normal   thickness leaflets. There was mild regurgitation. Valve area   (VTI): 2.47 cm^2. Valve area (Vmax): 2.14 cm^2. Valve area   (Vmean): 2.05 cm^2. - Systemic veins: IVC is small, suggesting low RA pressure and   hypovolemia.  - Technically adequate study.  Antimicrobials:   Rocephin 6/13>> 6/14    Subjective: Feeling better. She is wheezing more today. Does not feel dizzy on standing.  Objective: Vitals:   05/08/17 0641 05/08/17 0751 05/08/17 1045 05/08/17 1500  BP: (!) 109/54 (!) 131/52 (!) 140/50 (!) 157/58  Pulse: (!) 56 (!) 56  (!) 57  Resp: 20 17  18   Temp: 97.8 F (36.6 C) 97.5 F (36.4  C)  97.8 F (36.6 C)  TempSrc: Oral Oral  Oral  SpO2: 93% 93%  95%  Weight:      Height:        Intake/Output Summary (Last 24 hours) at 05/08/17 1839 Last data filed at 05/08/17 1300  Gross per 24 hour  Intake              530 ml  Output                0 ml  Net              530 ml   Filed Weights   05/07/17 0214 05/07/17  0655  Weight: 70.3 kg (155 lb) 73.7 kg (162 lb 8 oz)    Examination:  General exam: Appears calm and comfortable  Respiratory system: bilateral wheezes. Respiratory effort normal. Cardiovascular system: S1 & S2 heard, irregular. No JVD, murmurs, rubs, gallops or clicks. 1-2+ pedal edema. Gastrointestinal system: Abdomen is nondistended, soft and nontender. No organomegaly or masses felt. Normal bowel sounds heard. Central nervous system: Alert and oriented. No focal neurological deficits. Extremities: Symmetric 5 x 5 power. Skin: No rashes, lesions or ulcers Psychiatry: Judgement and insight appear normal. Mood & affect appropriate.     Data Reviewed: I have personally reviewed following labs and imaging studies  CBC:  Recent Labs Lab 05/07/17 0317 05/08/17 0428 05/08/17 1557  WBC 11.9* 8.5 11.0*  NEUTROABS 9.6*  --   --   HGB 11.9* 8.8* 9.5*  HCT 36.2 26.8* 28.6*  MCV 92.8 93.7 93.2  PLT 206 167 187   Basic Metabolic Panel:  Recent Labs Lab 05/07/17 0317 05/08/17 0428  NA 138 134*  K 4.3 4.5  CL 100* 103  CO2 27 25  GLUCOSE 140* 115*  BUN 41* 51*  CREATININE 2.20* 2.46*  CALCIUM 9.2 7.9*   GFR: Estimated Creatinine Clearance: 14.2 mL/min (A) (by C-G formula based on SCr of 2.46 mg/dL (H)). Liver Function Tests:  Recent Labs Lab 05/07/17 0317  AST 25  ALT 21  ALKPHOS 58  BILITOT 0.9  PROT 6.5  ALBUMIN 3.8    Recent Labs Lab 05/07/17 0317  LIPASE 21   No results for input(s): AMMONIA in the last 168 hours. Coagulation Profile: No results for input(s): INR, PROTIME in the last 168 hours. Cardiac Enzymes:  Recent Labs Lab 05/07/17 0704 05/07/17 1253 05/07/17 1820  TROPONINI <0.03 <0.03 <0.03   BNP (last 3 results) No results for input(s): PROBNP in the last 8760 hours. HbA1C: No results for input(s): HGBA1C in the last 72 hours. CBG: No results for input(s): GLUCAP in the last 168 hours. Lipid Profile: No results for input(s): CHOL,  HDL, LDLCALC, TRIG, CHOLHDL, LDLDIRECT in the last 72 hours. Thyroid Function Tests: No results for input(s): TSH, T4TOTAL, FREET4, T3FREE, THYROIDAB in the last 72 hours. Anemia Panel: No results for input(s): VITAMINB12, FOLATE, FERRITIN, TIBC, IRON, RETICCTPCT in the last 72 hours. Sepsis Labs:  Recent Labs Lab 05/07/17 0331  LATICACIDVEN 1.67    Recent Results (from the past 240 hour(s))  Urine Culture     Status: None   Collection Time: 05/07/17  2:26 AM  Result Value Ref Range Status   Specimen Description URINE, CATHETERIZED  Final   Special Requests NONE  Final   Culture   Final    NO GROWTH Performed at [facility], [address] [place], [place] [address]    Report Status 05/08/2017 FINAL  Final  Radiology Studies: Ct Head Wo Contrast  Result Date: 05/07/2017 CLINICAL DATA:  Status post fall. Tripped on chair, and hit right side of face and head. Nausea, vomiting and near syncope. Concern for cervical spine injury. Initial encounter. EXAM: CT HEAD WITHOUT CONTRAST CT CERVICAL SPINE WITHOUT CONTRAST TECHNIQUE: Multidetector CT imaging of the head and cervical spine was performed following the standard protocol without intravenous contrast. Multiplanar CT image reconstructions of the cervical spine were also generated. COMPARISON:  None. FINDINGS: CT HEAD FINDINGS Brain: No evidence of acute infarction, hemorrhage, hydrocephalus, extra-axial collection or mass lesion/mass effect. Prominence of the ventricles and sulci reflects moderate cortical volume loss. Cerebellar atrophy is noted. Scattered periventricular and subcortical white matter change likely reflects small vessel ischemic microangiopathy. The brainstem and fourth ventricle are within normal limits. The basal ganglia are unremarkable in appearance. The cerebral hemispheres demonstrate grossly normal gray-white differentiation. No mass effect or midline shift is seen. Vascular: No hyperdense vessel  or unexpected calcification. Skull: There is no evidence of fracture; visualized osseous structures are unremarkable in appearance. Sinuses/Orbits: The orbits are within normal limits. The paranasal sinuses and mastoid air cells are well-aerated. Other: No significant soft tissue abnormalities are seen. CT CERVICAL SPINE FINDINGS Alignment: The patient is status post anterior cervical spinal fusion at C4-C7. There is grade 2 anterolisthesis of C4 on C5, and grade 2 anterolisthesis of C7 on T1, with underlying facet disease. Degenerative change is noted about the dens. Skull base and vertebrae: No acute fracture. No primary bone lesion or focal pathologic process. Soft tissues and spinal canal: No prevertebral fluid or swelling. No visible canal hematoma. Disc levels: There is loss of the intervertebral disc space at C7-T1. Remaining intervertebral disc spaces are grossly preserved. Upper chest: Mild calcification is noted at the aortic arch and proximal great vessels. Scattered calcification is seen at the carotid bifurcations bilaterally. The thyroid gland is grossly unremarkable in appearance. There is minimal interstitial prominence at the lung apices. Other: No additional soft tissue abnormalities are seen. IMPRESSION: 1. No evidence of traumatic intracranial injury or fracture. 2. No evidence of acute fracture or subluxation along the cervical spine. 3. Status post anterior cervical spinal fusion at C4-C7. Underlying grade 2 anterolisthesis of C4 on C5 and of C7 on T1. 4. Moderate cortical volume loss and scattered small vessel ischemic microangiopathy. 5. Scattered calcification at the carotid bifurcations bilaterally. Carotid ultrasound would be helpful for further evaluation, when and as deemed clinically appropriate. Electronically Signed   By: Roanna RaiderJeffery  Chang M.D.   On: 05/07/2017 03:04   Ct Cervical Spine Wo Contrast  Result Date: 05/07/2017 CLINICAL DATA:  Status post fall. Tripped on chair, and hit  right side of face and head. Nausea, vomiting and near syncope. Concern for cervical spine injury. Initial encounter. EXAM: CT HEAD WITHOUT CONTRAST CT CERVICAL SPINE WITHOUT CONTRAST TECHNIQUE: Multidetector CT imaging of the head and cervical spine was performed following the standard protocol without intravenous contrast. Multiplanar CT image reconstructions of the cervical spine were also generated. COMPARISON:  None. FINDINGS: CT HEAD FINDINGS Brain: No evidence of acute infarction, hemorrhage, hydrocephalus, extra-axial collection or mass lesion/mass effect. Prominence of the ventricles and sulci reflects moderate cortical volume loss. Cerebellar atrophy is noted. Scattered periventricular and subcortical white matter change likely reflects small vessel ischemic microangiopathy. The brainstem and fourth ventricle are within normal limits. The basal ganglia are unremarkable in appearance. The cerebral hemispheres demonstrate grossly normal gray-white differentiation. No mass effect or midline shift is seen. Vascular:  No hyperdense vessel or unexpected calcification. Skull: There is no evidence of fracture; visualized osseous structures are unremarkable in appearance. Sinuses/Orbits: The orbits are within normal limits. The paranasal sinuses and mastoid air cells are well-aerated. Other: No significant soft tissue abnormalities are seen. CT CERVICAL SPINE FINDINGS Alignment: The patient is status post anterior cervical spinal fusion at C4-C7. There is grade 2 anterolisthesis of C4 on C5, and grade 2 anterolisthesis of C7 on T1, with underlying facet disease. Degenerative change is noted about the dens. Skull base and vertebrae: No acute fracture. No primary bone lesion or focal pathologic process. Soft tissues and spinal canal: No prevertebral fluid or swelling. No visible canal hematoma. Disc levels: There is loss of the intervertebral disc space at C7-T1. Remaining intervertebral disc spaces are grossly  preserved. Upper chest: Mild calcification is noted at the aortic arch and proximal great vessels. Scattered calcification is seen at the carotid bifurcations bilaterally. The thyroid gland is grossly unremarkable in appearance. There is minimal interstitial prominence at the lung apices. Other: No additional soft tissue abnormalities are seen. IMPRESSION: 1. No evidence of traumatic intracranial injury or fracture. 2. No evidence of acute fracture or subluxation along the cervical spine. 3. Status post anterior cervical spinal fusion at C4-C7. Underlying grade 2 anterolisthesis of C4 on C5 and of C7 on T1. 4. Moderate cortical volume loss and scattered small vessel ischemic microangiopathy. 5. Scattered calcification at the carotid bifurcations bilaterally. Carotid ultrasound would be helpful for further evaluation, when and as deemed clinically appropriate. Electronically Signed   By: Roanna Raider M.D.   On: 05/07/2017 03:04   Dg Chest Port 1 View  Result Date: 05/08/2017 CLINICAL DATA:  Wheezing, shortness of breath and nonproductive cough for 1 month. EXAM: PORTABLE CHEST 1 VIEW COMPARISON:  05/07/2017 FINDINGS: The cardiac silhouette, mediastinal and hilar contours are stable. Stable tortuosity and calcification of the thoracic aorta. Stable eventration of the right hemidiaphragm. Chronic bronchitic type interstitial changes but no infiltrates, edema or effusions. IMPRESSION: Mild chronic interstitial changes but no acute overlying pulmonary process. Electronically Signed   By: Rudie Meyer M.D.   On: 05/08/2017 16:47   Dg Chest Port 1 View  Result Date: 05/07/2017 CLINICAL DATA:  Status post fall, with nausea, vomiting and syncope. Initial encounter. EXAM: PORTABLE CHEST 1 VIEW COMPARISON:  Chest radiograph performed 03/20/2017 FINDINGS: There is elevation of the right hemidiaphragm. There is no evidence of focal opacification, pleural effusion or pneumothorax. The cardiomediastinal silhouette is  borderline normal in size. No acute osseous abnormalities are seen. IMPRESSION: Elevation of the right hemidiaphragm.  Lungs remain grossly clear. Electronically Signed   By: Roanna Raider M.D.   On: 05/07/2017 05:55        Scheduled Meds: . apixaban  2.5 mg Oral BID  . atorvastatin  20 mg Oral QPM  . furosemide  40 mg Intravenous BID  . guaiFENesin  1,200 mg Oral BID  . ipratropium-albuterol  3 mL Nebulization Q6H  . levothyroxine  100 mcg Oral QAC breakfast  . metoprolol succinate  25 mg Oral Daily  . pregabalin  50 mg Oral BID  . traMADol  50 mg Oral BID   Continuous Infusions:   LOS: 0 days    Time spent:    Dajsha Massaro, MD [facility] Pager [phone]  If 7PM-7AM, please contact night-coverage [email or web address] Password [facility] 05/08/2017, 6:39 PM

## 2017-05-09 DIAGNOSIS — R062 Wheezing: Secondary | ICD-10-CM

## 2017-05-09 LAB — CBC
HCT: 26.5 % — ABNORMAL LOW (ref 36.0–46.0)
Hemoglobin: 8.4 g/dL — ABNORMAL LOW (ref 12.0–15.0)
MCH: 29.9 pg (ref 26.0–34.0)
MCHC: 31.7 g/dL (ref 30.0–36.0)
MCV: 94.3 fL (ref 78.0–100.0)
PLATELETS: 168 10*3/uL (ref 150–400)
RBC: 2.81 MIL/uL — ABNORMAL LOW (ref 3.87–5.11)
RDW: 14.4 % (ref 11.5–15.5)
WBC: 7.7 10*3/uL (ref 4.0–10.5)

## 2017-05-09 LAB — BASIC METABOLIC PANEL
ANION GAP: 8 (ref 5–15)
BUN: 50 mg/dL — AB (ref 6–20)
CALCIUM: 8.3 mg/dL — AB (ref 8.9–10.3)
CO2: 26 mmol/L (ref 22–32)
CREATININE: 2 mg/dL — AB (ref 0.44–1.00)
Chloride: 101 mmol/L (ref 101–111)
GFR calc Af Amer: 24 mL/min — ABNORMAL LOW (ref >60)
GFR, EST NON AFRICAN AMERICAN: 21 mL/min — AB (ref >60)
Glucose, Bld: 110 mg/dL — ABNORMAL HIGH (ref 65–99)
Potassium: 4.4 mmol/L (ref 3.5–5.1)
Sodium: 135 mmol/L (ref 135–145)

## 2017-05-09 MED ORDER — PREDNISONE 20 MG PO TABS
40.0000 mg | ORAL_TABLET | Freq: Every day | ORAL | Status: DC
  Administered 2017-05-09 – 2017-05-10 (×2): 40 mg via ORAL
  Filled 2017-05-09 (×2): qty 2

## 2017-05-09 NOTE — Progress Notes (Signed)
PROGRESS NOTE    Ana Pena  ZOX:[id number] DOB: 1926/01/10 DOA: 05/07/2017 PCP: Ignatius Specking, MD    Brief Narrative:  83 year old female with a history of atrial fibrillation, chronic kidney disease, was at her cardiologist's office on the day of admission when she had a fall. She suffered a laceration to her right knee that was repaired in the emergency room. After returning home, she became increasingly nauseous and started vomiting. She then passed out on her commode. She was admitted to the hospital for further evaluation.   Assessment & Plan:   Principal Problem:   Syncope Active Problems:   CAD  s/p LAD stenting 8/13   Diastolic CHF (HCC)   Hypertension   Cardiomyopathy, ischemic   UTI (urinary tract infection)   AKI (acute kidney injury) (HCC)   CKD (chronic kidney disease) stage 3, GFR 30-59 ml/min   Laceration of right knee   Anemia   AF (paroxysmal atrial fibrillation) (HCC)   1. Syncope. Suspect this is related to orthostasis. She has not had any further symptoms. We'll recheck orthostatics. Echocardiogram is unremarkable. Cardiac enzymes have been negative. 2. Urinary tract infection. Patient was felt to have possible urinary tract infection. She was started on Rocephin. Urine culture has shown no growth, so antibiotics were discontinued. 3. Fall with right knee laceration. This was sutured in the emergency room. She has a significant bruising and hematoma around the left knee. X-rays of the knee were unremarkable. 4. Acute kidney injury on chronic kidney disease stage III. Creatinine initially trended up, but after starting IV Lasix, renal function is improving. Continue current treatment and follow.. 5. Anemia, possibly acute blood loss. She has has had a significant decline in hemoglobin since admission. She's not had any frank bleeding. Will check stool for occult blood. This blood loss may also be explained by bleeding into her right leg around her knee joint.  Follow serial hemoglobin. Hemoglobin has slightly declined further overnight. Will hold further anticoagulation for now. 6. Paroxysmal atrial fibrillation. Currently in sinus rhythm. Eliquis  currently on hold due to anemia  7. Chronic diastolic congestive heart failure. No evidence of pulmonary edema on chest x-ray. Continue current treatments. 8. COPD exacerbation. Patient has continued wheezing despite receiving bronchodilators. She'll be started on a course of prednisone.    DVT prophylaxis: eliquis Code Status: Full code Family Communication: Discussed with family at bedside Disposition Plan: Discharge home once improved.   Consultants:     Procedures:  Echo: - Left ventricle: The cavity size was normal. Wall thickness was   increased in a pattern of moderate LVH. Systolic function was   normal. The estimated ejection fraction was in the range of 55%   to 60%. Wall motion was normal; there were no regional wall   motion abnormalities. Doppler parameters are consistent with   abnormal left ventricular relaxation (grade 1 diastolic   dysfunction). - Aortic valve: Mildly calcified annulus. Trileaflet; normal   thickness leaflets. There was mild regurgitation. Valve area   (VTI): 2.47 cm^2. Valve area (Vmax): 2.14 cm^2. Valve area   (Vmean): 2.05 cm^2. - Systemic veins: IVC is small, suggesting low RA pressure and   hypovolemia.  - Technically adequate study.  Antimicrobials:   Rocephin 6/13>> 6/14    Subjective: Wheezing is mildly better today. Urinating well with Lasix.  Objective: Vitals:   05/09/17 0751 05/09/17 1359 05/09/17 1513 05/09/17 1930  BP:  (!) 134/46    Pulse:  (!) 59    Resp:  18    Temp:  98.1 F (36.7 C)    TempSrc:  Oral    SpO2: 93% 94% 94% 94%  Weight:      Height:       No intake or output data in the 24 hours ending 05/09/17 1956 Filed Weights   05/07/17 0214 05/07/17 0655  Weight: 70.3 kg (155 lb) 73.7 kg (162 lb 8 oz)     Examination:  General exam: Appears calm and comfortable  Respiratory system: bilateral wheezes. Respiratory effort normal. Cardiovascular system: S1 & S2 heard, irregular. No JVD, murmurs, rubs, gallops or clicks. 1+ pedal edema. Gastrointestinal system: Abdomen is nondistended, soft and nontender. No organomegaly or masses felt. Normal bowel sounds heard. Central nervous system: Alert and oriented. No focal neurological deficits. Extremities: Symmetric 5 x 5 power. Skin: No rashes, lesions or ulcers Psychiatry: Judgement and insight appear normal. Mood & affect appropriate.     Data Reviewed: I have personally reviewed following labs and imaging studies  CBC:  Recent Labs Lab 05/07/17 0317 05/08/17 0428 05/08/17 1557 05/09/17 0507  WBC 11.9* 8.5 11.0* 7.7  NEUTROABS 9.6*  --   --   --   HGB 11.9* 8.8* 9.5* 8.4*  HCT 36.2 26.8* 28.6* 26.5*  MCV 92.8 93.7 93.2 94.3  PLT 206 167 187 168   Basic Metabolic Panel:  Recent Labs Lab 05/07/17 0317 05/08/17 0428 05/09/17 0507  NA 138 134* 135  K 4.3 4.5 4.4  CL 100* 103 101  CO2 27 25 26   GLUCOSE 140* 115* 110*  BUN 41* 51* 50*  CREATININE 2.20* 2.46* 2.00*  CALCIUM 9.2 7.9* 8.3*   GFR: Estimated Creatinine Clearance: 17.4 mL/min (A) (by C-G formula based on SCr of 2 mg/dL (H)). Liver Function Tests:  Recent Labs Lab 05/07/17 0317  AST 25  ALT 21  ALKPHOS 58  BILITOT 0.9  PROT 6.5  ALBUMIN 3.8    Recent Labs Lab 05/07/17 0317  LIPASE 21   No results for input(s): AMMONIA in the last 168 hours. Coagulation Profile: No results for input(s): INR, PROTIME in the last 168 hours. Cardiac Enzymes:  Recent Labs Lab 05/07/17 0704 05/07/17 1253 05/07/17 1820  TROPONINI <0.03 <0.03 <0.03   BNP (last 3 results) No results for input(s): PROBNP in the last 8760 hours. HbA1C: No results for input(s): HGBA1C in the last 72 hours. CBG: No results for input(s): GLUCAP in the last 168 hours. Lipid  Profile: No results for input(s): CHOL, HDL, LDLCALC, TRIG, CHOLHDL, LDLDIRECT in the last 72 hours. Thyroid Function Tests: No results for input(s): TSH, T4TOTAL, FREET4, T3FREE, THYROIDAB in the last 72 hours. Anemia Panel: No results for input(s): VITAMINB12, FOLATE, FERRITIN, TIBC, IRON, RETICCTPCT in the last 72 hours. Sepsis Labs:  Recent Labs Lab 05/07/17 0331  LATICACIDVEN 1.67    Recent Results (from the past 240 hour(s))  Urine Culture     Status: None   Collection Time: 05/07/17  2:26 AM  Result Value Ref Range Status   Specimen Description URINE, CATHETERIZED  Final   Special Requests NONE  Final   Culture   Final    NO GROWTH Performed at [facility], [address] [place], [place] [address]    Report Status 05/08/2017 FINAL  Final         Radiology Studies: Dg Chest Port 1 View  Result Date: 05/08/2017 CLINICAL DATA:  Wheezing, shortness of breath and nonproductive cough for 1 month. EXAM: PORTABLE CHEST 1 VIEW  COMPARISON:  05/07/2017 FINDINGS: The cardiac silhouette, mediastinal and hilar contours are stable. Stable tortuosity and calcification of the thoracic aorta. Stable eventration of the right hemidiaphragm. Chronic bronchitic type interstitial changes but no infiltrates, edema or effusions. IMPRESSION: Mild chronic interstitial changes but no acute overlying pulmonary process. Electronically Signed   By: Rudie Meyer M.D.   On: 05/08/2017 16:47        Scheduled Meds: . atorvastatin  20 mg Oral QPM  . furosemide  40 mg Intravenous BID  . guaiFENesin  1,200 mg Oral BID  . ipratropium-albuterol  3 mL Nebulization TID  . levothyroxine  100 mcg Oral QAC breakfast  . metoprolol succinate  25 mg Oral Daily  . predniSONE  40 mg Oral Q breakfast  . pregabalin  50 mg Oral BID  . traMADol  50 mg Oral BID   Continuous Infusions:   LOS: 1 day    Time spent:    Lamesha Tibbits, MD [facility] Pager [phone]  If 7PM-7AM,  please contact night-coverage [email or web address] Password [facility] 05/09/2017, 7:56 PM

## 2017-05-09 NOTE — Care Management Important Message (Signed)
Important Message  Patient Details  Name: Ana RouxMarie W Engelken MRN: [id number] Date of Birth: 10/07/1926   Medicare Important Message Given:  Yes    Travares Nelles, Chrystine OilerSharley Diane, RN 05/09/2017, 11:58 AM

## 2017-05-10 LAB — CBC
HCT: 26.6 % — ABNORMAL LOW (ref 36.0–46.0)
Hemoglobin: 8.9 g/dL — ABNORMAL LOW (ref 12.0–15.0)
MCH: 30.5 pg (ref 26.0–34.0)
MCHC: 33.5 g/dL (ref 30.0–36.0)
MCV: 91.1 fL (ref 78.0–100.0)
PLATELETS: 176 10*3/uL (ref 150–400)
RBC: 2.92 MIL/uL — AB (ref 3.87–5.11)
RDW: 14.6 % (ref 11.5–15.5)
WBC: 10.4 10*3/uL (ref 4.0–10.5)

## 2017-05-10 LAB — BASIC METABOLIC PANEL
Anion gap: 10 (ref 5–15)
BUN: 56 mg/dL — ABNORMAL HIGH (ref 6–20)
CALCIUM: 8.2 mg/dL — AB (ref 8.9–10.3)
CHLORIDE: 98 mmol/L — AB (ref 101–111)
CO2: 26 mmol/L (ref 22–32)
CREATININE: 2.2 mg/dL — AB (ref 0.44–1.00)
GFR calc Af Amer: 22 mL/min — ABNORMAL LOW (ref >60)
GFR calc non Af Amer: 19 mL/min — ABNORMAL LOW (ref >60)
Glucose, Bld: 140 mg/dL — ABNORMAL HIGH (ref 65–99)
Potassium: 4.3 mmol/L (ref 3.5–5.1)
Sodium: 134 mmol/L — ABNORMAL LOW (ref 135–145)

## 2017-05-10 LAB — OCCULT BLOOD X 1 CARD TO LAB, STOOL: Fecal Occult Bld: NEGATIVE

## 2017-05-10 MED ORDER — PANTOPRAZOLE SODIUM 40 MG PO TBEC: 40.0000 mg | DELAYED_RELEASE_TABLET | Freq: Every day | ORAL | 1 refills | Status: DC

## 2017-05-10 MED ORDER — APIXABAN 2.5 MG PO TABS: 2.5000 mg | ORAL_TABLET | Freq: Two times a day (BID) | ORAL | 5 refills | Status: DC

## 2017-05-10 MED ORDER — PREDNISONE 20 MG PO TABS: 40.0000 mg | ORAL_TABLET | Freq: Every day | ORAL | 0 refills | Status: DC

## 2017-05-10 MED ORDER — ALBUTEROL SULFATE HFA 108 (90 BASE) MCG/ACT IN AERS: 2.0000 | INHALATION_SPRAY | Freq: Four times a day (QID) | RESPIRATORY_TRACT | 2 refills | Status: AC | PRN

## 2017-05-10 MED ORDER — GUAIFENESIN ER 600 MG PO TB12: 600.0000 mg | ORAL_TABLET | Freq: Two times a day (BID) | ORAL | 0 refills | Status: DC

## 2017-05-10 NOTE — Progress Notes (Signed)
Patient IV removed, Case Management involved with setting up  Home health Speech Therapy with [facility]. Patient and family given discharge instructions at bedside. Patient and Family given Contact information for [facility] home health, to come Monday 05/12/17.

## 2017-05-10 NOTE — Discharge Summary (Signed)
Physician Discharge Summary  Ana Pena ZOX:[id number] DOB: [birth year] DOA: 05/07/2017  PCP: Ignatius Specking, MD  Admit date: 05/07/2017 Discharge date: 05/10/2017  Admitted From: home Disposition:  home  Recommendations for Outpatient Follow-up:  1. Follow up with PCP in 1 week 2. Please obtain BMP/CBC in one week 3. Speech therapy evaluation as an outpatient 4. Have sutures in right knee removed in 1 week 5. Restart eliquis in 1 week  Home Health: home health speech therapy Equipment/Devices:  Discharge Condition stable CODE STATUS:full code Diet recommendation: Heart Healthy  Brief/Interim Summary: 83 year old female with a history of atrial fibrillation, chronic kidney disease, was at her cardiologist's office on the day of admission when she had a fall. She suffered a laceration to her right knee that was repaired in the emergency room. After returning home, she became increasingly nauseous and started vomiting. She then passed out on her commode. She was admitted to the hospital for further evaluation.  Discharge Diagnoses:  Principal Problem:   Syncope Active Problems:   CAD  s/p LAD stenting 8/13   Diastolic CHF (HCC)   Hypertension   Cardiomyopathy, ischemic   UTI (urinary tract infection)   AKI (acute kidney injury) (HCC)   CKD (chronic kidney disease) stage 3, GFR 30-59 ml/min   Laceration of right knee   Anemia   AF (paroxysmal atrial fibrillation) (HCC)  1. Syncope. Suspect this is related to orthostasis. She has not had any further symptoms. She was adequately hydrated. Echocardiogram is unremarkable. Cardiac enzymes have been negative. 2. Urinary tract infection. Patient was felt to have possible urinary tract infection. She was started on Rocephin. Urine culture showed no growth, so antibiotics were discontinued. 3. Fall with right knee laceration. This was sutured in the emergency room. She has significant bruising and hematoma around the left knee. X-rays of  the knee were unremarkable. She will need follow up in 1 week to have sutures removed. This was examined in the hospital and there did not appear to be any signs of infection 4. Acute kidney injury on chronic kidney disease stage III. Creatinine initially trended up, but after starting IV Lasix, renal function is improving. She will need repeat labs in 1 week. Continue on home dose of demadex 5. Anemia, possibly acute blood loss. She had a significant decline in hemoglobin since admission. She's not had any frank bleeding. Blood loss may also be explained by bleeding into her right leg around her knee joint. Eliquis was held and hemoglobin has since stabilized. She will need repeat cbc in 1 week. 6. Paroxysmal atrial fibrillation. Currently in sinus rhythm. Eliquis  currently on hold due to anemia. would restart in 1 week.  7. Chronic diastolic congestive heart failure. No evidence of pulmonary edema on chest x-ray. Continue current treatments. 8. COPD exacerbation. Patient has continued wheezing despite receiving bronchodilators. She was started on steroids without significant benefit. She reports that her wheeze is chronic and she feels that her breathing is currently at baseline. She also feels that she occasionally gets strangled after eating/drinking. She may have an element of dysphagia which is leading to aspiration and wheezing. She has been referred to outpatient speech therapy evaluation. She also describes significant GERD which could be contributing to wheezing. Will start on PPI.   Discharge Instructions  Discharge Instructions    Diet - low sodium heart healthy    Complete by:  As directed    Increase activity slowly    Complete by:  As directed  Allergies as of 05/10/2017      Reactions   Morphine And Related Nausea Only   Sulfa Antibiotics    unknown   Sulfa Drugs Cross Reactors    unknown      Medication List    STOP taking these medications   cephALEXin 500 MG  capsule Commonly known as:  KEFLEX     TAKE these medications   albuterol 108 (90 Base) MCG/ACT inhaler Commonly known as:  PROVENTIL HFA;VENTOLIN HFA Inhale 2 puffs into the lungs every 6 (six) hours as needed for wheezing or shortness of breath.   apixaban 2.5 MG Tabs tablet Commonly known as:  ELIQUIS Take 1 tablet (2.5 mg total) by mouth 2 (two) times daily. Resume on 05/17/17 What changed:  See the new instructions.   atorvastatin 20 MG tablet Commonly known as:  LIPITOR Take 20 mg by mouth every evening.   guaiFENesin 600 MG 12 hr tablet Commonly known as:  MUCINEX Take 1 tablet (600 mg total) by mouth 2 (two) times daily.   hydrALAZINE 25 MG tablet Commonly known as:  APRESOLINE Take 1 tablet (25 mg total) by mouth 3 (three) times daily.   levothyroxine 100 MCG tablet Commonly known as:  SYNTHROID, LEVOTHROID Take 100 mcg by mouth daily before breakfast.   Magnesium Oxide 200 MG Tabs Take 200 mg by mouth 2 (two) times daily.   metoprolol succinate 25 MG 24 hr tablet Commonly known as:  TOPROL-XL TAKE ONE TABLET BY MOUTH DAILY ( STOP CARVEDILOL)   nitroGLYCERIN 0.4 MG SL tablet Commonly known as:  NITROSTAT Place 0.4 mg under the tongue every 5 (five) minutes as needed for chest pain (up to 3 doses).   ondansetron 4 MG tablet Commonly known as:  ZOFRAN TAKE ONE TABLET BY MOUTH TWICE A DAY AS NEEDED FOR NAUSEA OR VOMITING   pantoprazole 40 MG tablet Commonly known as:  PROTONIX Take 1 tablet (40 mg total) by mouth daily.   predniSONE 20 MG tablet Commonly known as:  DELTASONE Take 2 tablets (40 mg total) by mouth daily with breakfast. Start taking on:  05/11/2017   pregabalin 50 MG capsule Commonly known as:  LYRICA Take 50 mg by mouth 2 (two) times daily.   psyllium 0.52 g capsule Commonly known as:  REGULOID Take 0.52 g by mouth 2 (two) times daily.   torsemide 20 MG tablet Commonly known as:  DEMADEX Take 1 tablet (20 mg total) by mouth daily. As  needed for swelling   traMADol 50 MG tablet Commonly known as:  ULTRAM Take 1 tablet by mouth 2 (two) times daily.   TRELEGY ELLIPTA 100-62.5-25 MCG/INH Aepb Generic drug:  Fluticasone-Umeclidin-Vilant   Vitamin D-3 1000 units Caps Take 5,000 Units by mouth daily.      Follow-up Information    Vyas, Dhruv B, MD. Schedule an appointment as soon as possible for a visit in 1 week(s).   Specialty:  Internal Medicine Contact information: [address] [place] [address] [phone]          Allergies  Allergen Reactions  . Morphine And Related Nausea Only  . Sulfa Antibiotics     unknown  . Sulfa Drugs Cross Reactors     unknown    Consultations:     Procedures/Studies: Dg Tibia/fibula Right  Result Date: 05/06/2017 CLINICAL DATA:  Tripped over chair with laceration to the lower leg. EXAM: RIGHT TIBIA AND FIBULA - 2 VIEW COMPARISON:  None. FINDINGS: Previous total knee arthroplasty. Soft  tissue swelling of the prepatellar soft tissues consistent with injury. No evidence of patellar fracture. No joint fluid or air. IMPRESSION: No evidence of bone or joint injury. Soft tissue swelling anterior to the patella and patellar tendon consistent with soft tissue injury. Electronically Signed   By: Paulina Fusi M.D.   On: 05/06/2017 17:13   Ct Head Wo Contrast  Result Date: 05/07/2017 CLINICAL DATA:  Status post fall. Tripped on chair, and hit right side of face and head. Nausea, vomiting and near syncope. Concern for cervical spine injury. Initial encounter. EXAM: CT HEAD WITHOUT CONTRAST CT CERVICAL SPINE WITHOUT CONTRAST TECHNIQUE: Multidetector CT imaging of the head and cervical spine was performed following the standard protocol without intravenous contrast. Multiplanar CT image reconstructions of the cervical spine were also generated. COMPARISON:  None. FINDINGS: CT HEAD FINDINGS Brain: No evidence of acute infarction, hemorrhage, hydrocephalus, extra-axial collection or mass  lesion/mass effect. Prominence of the ventricles and sulci reflects moderate cortical volume loss. Cerebellar atrophy is noted. Scattered periventricular and subcortical white matter change likely reflects small vessel ischemic microangiopathy. The brainstem and fourth ventricle are within normal limits. The basal ganglia are unremarkable in appearance. The cerebral hemispheres demonstrate grossly normal gray-white differentiation. No mass effect or midline shift is seen. Vascular: No hyperdense vessel or unexpected calcification. Skull: There is no evidence of fracture; visualized osseous structures are unremarkable in appearance. Sinuses/Orbits: The orbits are within normal limits. The paranasal sinuses and mastoid air cells are well-aerated. Other: No significant soft tissue abnormalities are seen. CT CERVICAL SPINE FINDINGS Alignment: The patient is status post anterior cervical spinal fusion at C4-C7. There is grade 2 anterolisthesis of C4 on C5, and grade 2 anterolisthesis of C7 on T1, with underlying facet disease. Degenerative change is noted about the dens. Skull base and vertebrae: No acute fracture. No primary bone lesion or focal pathologic process. Soft tissues and spinal canal: No prevertebral fluid or swelling. No visible canal hematoma. Disc levels: There is loss of the intervertebral disc space at C7-T1. Remaining intervertebral disc spaces are grossly preserved. Upper chest: Mild calcification is noted at the aortic arch and proximal great vessels. Scattered calcification is seen at the carotid bifurcations bilaterally. The thyroid gland is grossly unremarkable in appearance. There is minimal interstitial prominence at the lung apices. Other: No additional soft tissue abnormalities are seen. IMPRESSION: 1. No evidence of traumatic intracranial injury or fracture. 2. No evidence of acute fracture or subluxation along the cervical spine. 3. Status post anterior cervical spinal fusion at C4-C7.  Underlying grade 2 anterolisthesis of C4 on C5 and of C7 on T1. 4. Moderate cortical volume loss and scattered small vessel ischemic microangiopathy. 5. Scattered calcification at the carotid bifurcations bilaterally. Carotid ultrasound would be helpful for further evaluation, when and as deemed clinically appropriate. Electronically Signed   By: Roanna Raider M.D.   On: 05/07/2017 03:04   Ct Cervical Spine Wo Contrast  Result Date: 05/07/2017 CLINICAL DATA:  Status post fall. Tripped on chair, and hit right side of face and head. Nausea, vomiting and near syncope. Concern for cervical spine injury. Initial encounter. EXAM: CT HEAD WITHOUT CONTRAST CT CERVICAL SPINE WITHOUT CONTRAST TECHNIQUE: Multidetector CT imaging of the head and cervical spine was performed following the standard protocol without intravenous contrast. Multiplanar CT image reconstructions of the cervical spine were also generated. COMPARISON:  None. FINDINGS: CT HEAD FINDINGS Brain: No evidence of acute infarction, hemorrhage, hydrocephalus, extra-axial collection or mass lesion/mass effect. Prominence of the ventricles  and sulci reflects moderate cortical volume loss. Cerebellar atrophy is noted. Scattered periventricular and subcortical white matter change likely reflects small vessel ischemic microangiopathy. The brainstem and fourth ventricle are within normal limits. The basal ganglia are unremarkable in appearance. The cerebral hemispheres demonstrate grossly normal gray-white differentiation. No mass effect or midline shift is seen. Vascular: No hyperdense vessel or unexpected calcification. Skull: There is no evidence of fracture; visualized osseous structures are unremarkable in appearance. Sinuses/Orbits: The orbits are within normal limits. The paranasal sinuses and mastoid air cells are well-aerated. Other: No significant soft tissue abnormalities are seen. CT CERVICAL SPINE FINDINGS Alignment: The patient is status post  anterior cervical spinal fusion at C4-C7. There is grade 2 anterolisthesis of C4 on C5, and grade 2 anterolisthesis of C7 on T1, with underlying facet disease. Degenerative change is noted about the dens. Skull base and vertebrae: No acute fracture. No primary bone lesion or focal pathologic process. Soft tissues and spinal canal: No prevertebral fluid or swelling. No visible canal hematoma. Disc levels: There is loss of the intervertebral disc space at C7-T1. Remaining intervertebral disc spaces are grossly preserved. Upper chest: Mild calcification is noted at the aortic arch and proximal great vessels. Scattered calcification is seen at the carotid bifurcations bilaterally. The thyroid gland is grossly unremarkable in appearance. There is minimal interstitial prominence at the lung apices. Other: No additional soft tissue abnormalities are seen. IMPRESSION: 1. No evidence of traumatic intracranial injury or fracture. 2. No evidence of acute fracture or subluxation along the cervical spine. 3. Status post anterior cervical spinal fusion at C4-C7. Underlying grade 2 anterolisthesis of C4 on C5 and of C7 on T1. 4. Moderate cortical volume loss and scattered small vessel ischemic microangiopathy. 5. Scattered calcification at the carotid bifurcations bilaterally. Carotid ultrasound would be helpful for further evaluation, when and as deemed clinically appropriate. Electronically Signed   By: Roanna Raider M.D.   On: 05/07/2017 03:04   Dg Chest Port 1 View  Result Date: 05/08/2017 CLINICAL DATA:  Wheezing, shortness of breath and nonproductive cough for 1 month. EXAM: PORTABLE CHEST 1 VIEW COMPARISON:  05/07/2017 FINDINGS: The cardiac silhouette, mediastinal and hilar contours are stable. Stable tortuosity and calcification of the thoracic aorta. Stable eventration of the right hemidiaphragm. Chronic bronchitic type interstitial changes but no infiltrates, edema or effusions. IMPRESSION: Mild chronic interstitial  changes but no acute overlying pulmonary process. Electronically Signed   By: Rudie Meyer M.D.   On: 05/08/2017 16:47   Dg Chest Port 1 View  Result Date: 05/07/2017 CLINICAL DATA:  Status post fall, with nausea, vomiting and syncope. Initial encounter. EXAM: PORTABLE CHEST 1 VIEW COMPARISON:  Chest radiograph performed 03/20/2017 FINDINGS: There is elevation of the right hemidiaphragm. There is no evidence of focal opacification, pleural effusion or pneumothorax. The cardiomediastinal silhouette is borderline normal in size. No acute osseous abnormalities are seen. IMPRESSION: Elevation of the right hemidiaphragm.  Lungs remain grossly clear. Electronically Signed   By: Roanna Raider M.D.   On: 05/07/2017 05:55   Echo: - Left ventricle: The cavity size was normal. Wall thickness was   increased in a pattern of moderate LVH. Systolic function was   normal. The estimated ejection fraction was in the range of 55%   to 60%. Wall motion was normal; there were no regional wall   motion abnormalities. Doppler parameters are consistent with   abnormal left ventricular relaxation (grade 1 diastolic   dysfunction). - Aortic valve: Mildly calcified annulus. Trileaflet; normal  thickness leaflets. There was mild regurgitation. Valve area   (VTI): 2.47 cm^2. Valve area (Vmax): 2.14 cm^2. Valve area   (Vmean): 2.05 cm^2. - Systemic veins: IVC is small, suggesting low RA pressure and   hypovolemia. - Technically adequate study.   Subjective: Feeling good. She continues to have wheezing and cough but feels this is a chronic findings. She feels her respiratory status is at baseline  Discharge Exam: Vitals:   05/09/17 2212 05/10/17 0639  BP: (!) 137/53 (!) 131/50  Pulse: 67 (!) 55  Resp: 18 18  Temp: 97.6 F (36.4 C) 97.6 F (36.4 C)   Vitals:   05/09/17 1930 05/09/17 2212 05/10/17 0639 05/10/17 0728  BP:  (!) 137/53 (!) 131/50   Pulse:  67 (!) 55   Resp:  18 18   Temp:  97.6 F (36.4 C)  97.6 F (36.4 C)   TempSrc:  Oral Oral   SpO2: 94% 97% 93% 93%  Weight:   72.5 kg (159 lb 13.3 oz)   Height:        General: Pt is alert, awake, not in acute distress Cardiovascular: irregular, S1/S2 +, no rubs, no gallops Respiratory: mild wheeze bilaterally Abdominal: Soft, NT, ND, bowel sounds + Extremities: 1+ edema, bruising over right knee    The results of significant diagnostics from this hospitalization (including imaging, microbiology, ancillary and laboratory) are listed below for reference.     Microbiology: Recent Results (from the past 240 hour(s))  Urine Culture     Status: None   Collection Time: 05/07/17  2:26 AM  Result Value Ref Range Status   Specimen Description URINE, CATHETERIZED  Final   Special Requests NONE  Final   Culture   Final    NO GROWTH Performed at [facility], [address], [place], [place] [address]    Report Status 05/08/2017 FINAL  Final     Labs: BNP (last 3 results)  Recent Labs  03/20/17 1207  BNP 106.0*   Basic Metabolic Panel:  Recent Labs Lab 05/07/17 0317 05/08/17 0428 05/09/17 0507 05/10/17 0639  NA 138 134* 135 134*  K 4.3 4.5 4.4 4.3  CL 100* 103 101 98*  CO2 27 25 26 26   GLUCOSE 140* 115* 110* 140*  BUN 41* 51* 50* 56*  CREATININE 2.20* 2.46* 2.00* 2.20*  CALCIUM 9.2 7.9* 8.3* 8.2*   Liver Function Tests:  Recent Labs Lab 05/07/17 0317  AST 25  ALT 21  ALKPHOS 58  BILITOT 0.9  PROT 6.5  ALBUMIN 3.8    Recent Labs Lab 05/07/17 0317  LIPASE 21   No results for input(s): AMMONIA in the last 168 hours. CBC:  Recent Labs Lab 05/07/17 0317 05/08/17 0428 05/08/17 1557 05/09/17 0507 05/10/17 0639  WBC 11.9* 8.5 11.0* 7.7 10.4  NEUTROABS 9.6*  --   --   --   --   HGB 11.9* 8.8* 9.5* 8.4* 8.9*  HCT 36.2 26.8* 28.6* 26.5* 26.6*  MCV 92.8 93.7 93.2 94.3 91.1  PLT 206 167 187 168 176   Cardiac Enzymes:  Recent Labs Lab 05/07/17 0704 05/07/17 1253 05/07/17 1820  TROPONINI  <0.03 <0.03 <0.03   BNP: Invalid input(s): POCBNP CBG: No results for input(s): GLUCAP in the last 168 hours. D-Dimer No results for input(s): DDIMER in the last 72 hours. Hgb A1c No results for input(s): HGBA1C in the last 72 hours. Lipid Profile No results for input(s): CHOL, HDL, LDLCALC, TRIG, CHOLHDL, LDLDIRECT in the last 72  hours. Thyroid function studies No results for input(s): TSH, T4TOTAL, T3FREE, THYROIDAB in the last 72 hours.  Invalid input(s): FREET3 Anemia work up No results for input(s): VITAMINB12, FOLATE, FERRITIN, TIBC, IRON, RETICCTPCT in the last 72 hours. Urinalysis    Component Value Date/Time   COLORURINE YELLOW 05/07/2017 0226   APPEARANCEUR CLEAR 05/07/2017 0226   LABSPEC 1.025 05/07/2017 0226   PHURINE 5.5 05/07/2017 0226   GLUCOSEU NEGATIVE 05/07/2017 0226   HGBUR NEGATIVE 05/07/2017 0226   BILIRUBINUR NEGATIVE 05/07/2017 0226   KETONESUR TRACE (A) 05/07/2017 0226   PROTEINUR 100 (A) 05/07/2017 0226   NITRITE NEGATIVE 05/07/2017 0226   LEUKOCYTESUR SMALL (A) 05/07/2017 0226   Sepsis Labs Invalid input(s): PROCALCITONIN,  WBC,  LACTICIDVEN Microbiology Recent Results (from the past 240 hour(s))  Urine Culture     Status: None   Collection Time: 05/07/17  2:26 AM  Result Value Ref Range Status   Specimen Description URINE, CATHETERIZED  Final   Special Requests NONE  Final   Culture   Final    NO GROWTH Performed at [facility], [address] [place], [place] [id number]    Report Status 05/08/2017 FINAL  Final     Time coordinating discharge: Over 30 minutes  SIGNED:   Erick BlinksMEMON,Sumaiyah Markert, MD  [facility] 05/10/2017, 1:34 PM Pager   If 7PM-7AM, please contact night-coverage [email or web address] Password [email or web address]

## 2017-05-22 ENCOUNTER — Telehealth: Payer: Self-pay | Admitting: *Deleted

## 2017-05-22 NOTE — Telephone Encounter (Signed)
F/u with pt to reschedule appt (pt fell in lobby) Pt had f/u with Dr Sherril CroonVyas today who [facility] oozing/bleeding - pt said she would call back to reschedule her appt with Dr Wyline MoodBranch - appreciative of call

## 2017-07-04 ENCOUNTER — Encounter (HOSPITAL_COMMUNITY): Payer: Self-pay

## 2017-07-04 NOTE — Therapy (Signed)
[place], [place], [address] Phone: [phone]   Fax:  [phone]  Patient Details  Name: Ana Pena MRN: [id number] Date of Birth: 02/08/1926 Referring Provider:  No ref. provider found  Encounter Date: 07/04/2017   PHYSICAL THERAPY DISCHARGE SUMMARY  Visits from Start of Care: 7  Current functional level related to goals / functional outcomes: See last treatment note   Remaining deficits: See last treatment note   Education / Equipment: n/a Plan: Patient agrees to discharge.  Patient goals were not met. Patient is being discharged due to not returning since the last visit.  ?????       Geraldine Solar PT, [place] [address] [place], [place], [address] Phone: [phone]   Fax:  [phone]

## 2017-08-25 ENCOUNTER — Encounter: Payer: Self-pay | Admitting: *Deleted

## 2017-08-25 ENCOUNTER — Ambulatory Visit (INDEPENDENT_AMBULATORY_CARE_PROVIDER_SITE_OTHER): Payer: Medicare Other | Admitting: Cardiology

## 2017-08-25 ENCOUNTER — Encounter: Payer: Self-pay | Admitting: Cardiology

## 2017-08-25 VITALS — BP 156/68 | HR 49 | Ht 60.0 in | Wt 160.2 lb

## 2017-08-25 DIAGNOSIS — I2510 Atherosclerotic heart disease of native coronary artery without angina pectoris: Secondary | ICD-10-CM

## 2017-08-25 DIAGNOSIS — I480 Paroxysmal atrial fibrillation: Secondary | ICD-10-CM

## 2017-08-25 DIAGNOSIS — I5022 Chronic systolic (congestive) heart failure: Secondary | ICD-10-CM | POA: Diagnosis not present

## 2017-08-25 DIAGNOSIS — I10 Essential (primary) hypertension: Secondary | ICD-10-CM | POA: Diagnosis not present

## 2017-08-25 NOTE — Patient Instructions (Signed)
Your physician recommends that you schedule a follow-up appointment in: 3 MONTHS WITH DR BRANCH  Your physician recommends that you continue on your current medications as directed. Please refer to the Current Medication list given to you today.  Thank you for choosing [facility]!!    

## 2017-08-25 NOTE — Progress Notes (Signed)
CliniFrayl Summary Ms. Garrabrant is a 83 y.o.female  seen today for follow up of the following medical problems.   1. Afib - 11/16/15 monitor with reviewed including full disclosure report, shows episodes of afib/aflutter with RVR as well as some sinus bradycardia.   - had been on amiodarone. She was having some issues with SOB. We obtained PFTs 03/2017 which showed decreased DLCO worst since 06/2016 study. Amio was stopped.   - no recent palpitations.  - breathing is improving.  - eliquis stopped after recent leg bleeding. Has not restarted.   2. CAD/ICM/Chronic systolic heart failure - history of prior DES to LAD in 06/2012  - cath 04/2013 with patent LAD stent  - LVEF 40-45% by echo 04/2013  - at a prior visit described some DOE with short distances. Notes some chest pain at times. Episode while at [facility]. Sharp pain in midchest, 10/10 in severity. No other symptoms. No positional. Lasted 15 minutes. Occurs once every 3-4 weeks. No relation to food.  - we repeated her echo which showed stable LVEF at 40-45%, grade I diastolic dysfunction.  - Lexiscan MPI 06/2015 showed no ischemia. - benazepril stopped due to hyperkalemia. Generalized fatigue did not improve with trial off coreg - we recently changed coreg to Toprol as thought could be causing some bronchospasm.   -04/2017 echo: LVEF 55-60%, no WMA, grade I diastoilc dysfunction, mild AI, small IVC - no recent edema. Home weights stable around 158.6 lbs. Got as low as 155 lbs. Taking torsemide  daily. Has repeat labs pending with Dr Sherril Croon next week.  - can have some mild orthostatic symptoms.   3. HTN - elevated in clinic - at pcp visit 01/2017 bps 120s/70s.  - we had previously lowered her norvasc due to orthostatic symptoms. On further history appears being off balance with standing is more so related to leg weakness as opposed to dizziness. Since lowering norvasc no improvement in symptoms.    4. History of  vertigo - can have dizziness with turning head   5. Pneumonia -on home O2 after recent admission with pneumonia.     6. Syncope - admission 04/2017 with syncope thought to be orthostatic.  - no recurrent syncope since last visit.     Past Medical History:  Diagnosis Date  . Abnormal CT scan    a. Question of possible renal cyst in RUQ by CT scan 06/2012 at [facility]. b. Redemonstrated 04/2013, pt instructed to f/u PCP.  [name] Arthritis    "all over" (05/11/2013)  . Asthma   . CAD (coronary artery disease)    a. s/p PTCA/DES to LAD 07/23/12. b. Mildly elevated trop 04/2013 with stable cath, no culprit.  . Chronic lower back pain   . Chronic venous insufficiency   . CKD (chronic kidney disease)   . Diastolic CHF (HCC)    a. Prior normal EF 06/2013. b. EF 40-45% by echo 04/2013.  . Diverticulosis   . GERD (gastroesophageal reflux disease)   . H/O hiatal hernia   . Hypercholesteremia   . Hypertension   . Hypothyroidism   . Iron deficiency anemia   . LBBB (left bundle Atziri Zubiate block)      Allergies  Allergen Reactions  . Morphine And Related Nausea Only  . Sulfa Antibiotics     unknown  . Sulfa Drugs Cross Reactors     unknown     Current Outpatient Prescriptions  Medication Sig Dispense Refill  . albuterol (PROVENTIL HFA;VENTOLIN HFA) 108 (  90 Base) MCG/ACT inhaler Inhale 2 puffs into the lungs every 6 (six) hours as needed for wheezing or shortness of breath. 1 Inhaler 2  . apixaban (ELIQUIS) 2.5 MG TABS tablet Take 1 tablet (2.5 mg total) by mouth 2 (two) times daily. Resume on 05/17/17 60 tablet 5  . atorvastatin (LIPITOR) 20 MG tablet Take 20 mg by mouth every evening.    . Cholecalciferol (VITAMIN D-3) 1000 UNITS CAPS Take 5,000 Units by mouth daily.     [name] guaiFENesin (MUCINEX) 600 MG 12 hr tablet Take 1 tablet (600 mg total) by mouth 2 (two) times daily. 30 tablet 0  . hydrALAZINE (APRESOLINE) 25 MG tablet Take 1 tablet (25 mg total) by mouth 3 (three) times daily. 270 tablet  3  . levothyroxine (SYNTHROID, LEVOTHROID) 100 MCG tablet Take 100 mcg by mouth daily before breakfast.    . Magnesium Oxide 200 MG TABS Take 200 mg by mouth 2 (two) times daily.    . metoprolol succinate (TOPROL-XL) 25 MG 24 hr tablet TAKE ONE TABLET BY MOUTH DAILY ( STOP CARVEDILOL) 90 tablet 3  . nitroGLYCERIN (NITROSTAT) 0.4 MG SL tablet Place 0.4 mg under the tongue every 5 (five) minutes as needed for chest pain (up to 3 doses).    . ondansetron (ZOFRAN) 4 MG tablet TAKE ONE TABLET BY MOUTH TWICE A DAY AS NEEDED FOR NAUSEA OR VOMITING 30 tablet 0  . pantoprazole (PROTONIX) 40 MG tablet Take 1 tablet (40 mg total) by mouth daily. 30 tablet 1  . predniSONE (DELTASONE) 20 MG tablet Take 2 tablets (40 mg total) by mouth daily with breakfast. 10 tablet 0  . pregabalin (LYRICA) 50 MG capsule Take 50 mg by mouth 2 (two) times daily.    . psyllium (REGULOID) 0.52 G capsule Take 0.52 g by mouth 2 (two) times daily.     [name] torsemide (DEMADEX) 20 MG tablet Take 1 tablet (20 mg total) by mouth daily. As needed for swelling 90 tablet 3  . traMADol (ULTRAM) 50 MG tablet Take 1 tablet by mouth 2 (two) times daily.    . TRELEGY ELLIPTA 100-62.5-25 MCG/INH AEPB      No current facility-administered medications for this visit.      Past Surgical History:  Procedure Laterality Date  . ABDOMINAL HYSTERECTOMY    . BREAST CYST EXCISION Right 1970's  . BUNIONECTOMY Bilateral   . CARDIAC CATHETERIZATION  05/11/2013  . CARPAL TUNNEL RELEASE Right   . CATARACT EXTRACTION W/ INTRAOCULAR LENS  IMPLANT, BILATERAL    . CHOLECYSTECTOMY    . CORONARY ANGIOPLASTY WITH STENT PLACEMENT  06/2012   DES proximal LAD [name] 05/11/2013  . ESOPHAGOGASTRODUODENOSCOPY N/A 02/14/2016   Procedure: ESOPHAGOGASTRODUODENOSCOPY (EGD);  Surgeon: Malissa Hippo, MD;  Location: [facility];  Service: Endoscopy;  Laterality: N/A;  1:40  . HAMMER TOE SURGERY Bilateral   . LEFT HEART CATHETERIZATION WITH CORONARY ANGIOGRAM N/A  07/23/2012   Procedure: LEFT HEART CATHETERIZATION WITH CORONARY ANGIOGRAM;  Surgeon: Kathleene Hazel, MD;  Location: [facility] CATH LAB;  Service: Cardiovascular;  Laterality: N/A;  . LEFT HEART CATHETERIZATION WITH CORONARY ANGIOGRAM N/A 05/11/2013   Procedure: LEFT HEART CATHETERIZATION WITH CORONARY ANGIOGRAM;  Surgeon: Kathleene Hazel, MD;  Location: [facility] CATH LAB;  Service: Cardiovascular;  Laterality: N/A;  . PERCUTANEOUS CORONARY STENT INTERVENTION (PCI-S)  07/23/2012   Procedure: PERCUTANEOUS CORONARY STENT INTERVENTION (PCI-S);  Surgeon: Kathleene Hazel, MD;  Location: [facility] CATH LAB;  Service: Cardiovascular;;  . TOE SURGERY     "  don't really remember which toe or which side; it was real crooked and had to be straightened" (05/11/2013)  . TOTAL KNEE ARTHROPLASTY Right 2000     Allergies  Allergen Reactions  . Morphine And Related Nausea Only  . Sulfa Antibiotics     unknown  . Sulfa Drugs Cross Reactors     unknown      Family History  Problem Relation Age of Onset  . Breast cancer Mother   . Heart disease Father      Social History Ms. Greenly reports that she has never smoked. She has never used smokeless tobacco. Ms. Marquard reports that she does not drink alcohol.   Review of Systems CONSTITUTIONAL: No weight loss, fever, chills, weakness or fatigue.  HEENT: Eyes: No visual loss, blurred vision, double vision or yellow sclerae.No hearing loss, sneezing, congestion, runny nose or sore throat.  SKIN: No rash or itching.  CARDIOVASCULAR: per hpi RESPIRATORY: per hpi GASTROINTESTINAL: No anorexia, nausea, vomiting or diarrhea. No abdominal pain or blood.  GENITOURINARY: No burning on urination, no polyuria NEUROLOGICAL: No headache, dizziness, syncope, paralysis, ataxia, numbness or tingling in the extremities. No change in bowel or bladder control.  MUSCULOSKELETAL: No muscle, back pain, joint pain or stiffness.  LYMPHATICS: No enlarged nodes. No history of  splenectomy.  PSYCHIATRIC: No history of depression or anxiety.  ENDOCRINOLOGIC: No reports of sweating, cold or heat intolerance. No polyuria or polydipsia.  [name]   Physical Examination Vitals:   08/25/17 1506  BP: (!) 156/68  Pulse: (!) 49  SpO2: 96%   Vitals:   08/25/17 1506  Weight: 160 lb 3.2 oz (72.7 kg)  Height: 5' (1.524 m)    Gen: resting comfortably, no acute distress HEENT: no scleral icterus, pupils equal round and reactive, no palptable cervical adenopathy,  CV: RRR, no m/rg, nojvd Resp: Clear to auscultation bilaterally GI: abdomen is soft, non-tender, non-distended, normal bowel sounds, no hepatosplenomegaly MSK: extremities are warm, no edema.  Skin: warm, no rash Neuro:  no focal deficits Psych: appropriate affect   Diagnostic Studies 04/2013 Cath Hemodynamic Findings: Central aortic pressure: 161/60  Left ventricular pressure: 159/8/13  Angiographic Findings:  Left main: No obstructive disease.  Left Anterior Descending Artery: Large caliber vessel that courses to the apex. The proximal vessel has 20% stenosis. The mid vessel has a patent stented segment with no restenosis noted. The diagonal is patent. The mid to distal vessel has a focal 50% smooth stenosis which does not appear to be flow limiting, unchanged in appearance from cath in 2013.  Circumflex Artery: Moderate sized vessel with proximal plaque, 40% smooth stenosis in the mid vessel. The first OM is small and patent. The AV groove Circumflex terminates into a moderate caliber second OM Jesper Stirewalt.  Right Coronary Artery: Large, dominant vessel with 30% mid stenosis.  Left Ventricular Angiogram: Deferred.  Impression:  1. Stable single vessel CAD with patent stent proximal to mid LAD  2. Mild to moderate non-obstructive disease in the distal LAD, distal Circumflex and mid RCA  Recommendations: Continue medical management. Her d-dimer is slightly elevated but [facility] chest at [facility] without  evidence of PE. Will monitor overnight.   04/2013 Echo LVEF 40-45%, grade I diastolic dysfunction,   06/2015 echo Study Conclusions  - Left ventricle: The cavity size was normal. There was mild concentric hypertrophy. Systolic function was mildly to moderately reduced. The estimated ejection fraction was in the range of 40% to 45%. Diffuse hypokinesis. Doppler parameters are consistent with abnormal left  ventricular relaxation (grade 1 diastolic dysfunction). Elevated filling pressures. - Ventricular septum: Septal motion showed abnormal function and dyssynergy. These changes are consistent with intraventricular conduction delay. - Aortic valve: There was trivial regurgitation. - Mitral valve: Mildly thickened leaflets . There was mild regurgitation. - Left atrium: The atrium was mildly dilated. - Tricuspid valve: There was mild regurgitation. - Pulmonary arteries: PA peak pressure: 38 mm Hg (S). Mildly elevated pulmonary pressures.  06/2015 Lexiscan MPI  LBBB present throughout study.  There is a small defect of mild severity present in the mid anteroseptal, apical anterior and apical septal location. The defect is non-reversible. Consistent with scar versus soft tissue attenuation. No definite ischemia.  This is a low risk study.  Nuclear stress EF: 53%. Mild global hypokinesis  04/2017 echo Study Conclusions  - Left ventricle: The cavity size was normal. Wall thickness was   increased in a pattern of moderate LVH. Systolic function was   normal. The estimated ejection fraction was in the range of 55%   to 60%. Wall motion was normal; there were no regional wall   motion abnormalities. Doppler parameters are consistent with   abnormal left ventricular relaxation (grade 1 diastolic   dysfunction). - Aortic valve: Mildly calcified annulus. Trileaflet; normal   thickness leaflets. There was mild regurgitation. Valve area   (VTI): 2.47 cm^2. Valve  area (Vmax): 2.14 cm^2. Valve area   (Vmean): 2.05 cm^2. - Systemic veins: IVC is small, suggesting low RA pressure and   hypovolemia. - Technically adequate study.  Assessment and Plan  1. Afib -no recent symptoms. AMio stopped recently due to SOB and change in PFTs. Ekg shows she remains in SR - eliquis on hold after recent leg injury and bleeding a few months ago. We will restart eliquis 2.5mg  bid.   2. CAD/ICM/Chronic systolic HF/SOB -no recent chest pain or edema - continue current meds   3. HTN - with prior othostatic syncope we have avoided aggressive lowering 0- continue current meds         Antoine Poche, M.D.

## 2017-08-26 ENCOUNTER — Telehealth: Payer: Self-pay | Admitting: *Deleted

## 2017-08-26 NOTE — Telephone Encounter (Signed)
-----   Message from Antoine Poche, MD sent at 08/26/2017  9:34 AM EDT ----- Can we have this patient restart her eliquis 2.5mg  bid   Dominga Ferry MD

## 2017-08-26 NOTE — Telephone Encounter (Signed)
Pt daughter Bjorn Loser [facility]) voiced understanding - says pt has medication at home and did not need refills sent to pharmacy at this time - update medication list

## 2017-09-18 ENCOUNTER — Other Ambulatory Visit: Payer: Self-pay | Admitting: Cardiology

## 2017-12-18 ENCOUNTER — Emergency Department (HOSPITAL_COMMUNITY)
Admission: EM | Admit: 2017-12-18 | Discharge: 2017-12-19 | Disposition: A | Discharge status: Home / Self Care | Payer: Medicare Other | Attending: Emergency Medicine | Admitting: Emergency Medicine

## 2017-12-18 ENCOUNTER — Encounter (HOSPITAL_COMMUNITY): Payer: Self-pay | Admitting: Emergency Medicine

## 2017-12-18 ENCOUNTER — Emergency Department (HOSPITAL_COMMUNITY): Payer: Medicare Other

## 2017-12-18 ENCOUNTER — Other Ambulatory Visit: Payer: Self-pay

## 2017-12-18 DIAGNOSIS — I5030 Unspecified diastolic (congestive) heart failure: Secondary | ICD-10-CM | POA: Insufficient documentation

## 2017-12-18 DIAGNOSIS — N183 Chronic kidney disease, stage 3 (moderate): Secondary | ICD-10-CM | POA: Diagnosis not present

## 2017-12-18 DIAGNOSIS — R109 Unspecified abdominal pain: Secondary | ICD-10-CM | POA: Diagnosis not present

## 2017-12-18 DIAGNOSIS — Z955 Presence of coronary angioplasty implant and graft: Secondary | ICD-10-CM | POA: Insufficient documentation

## 2017-12-18 DIAGNOSIS — I2510 Atherosclerotic heart disease of native coronary artery without angina pectoris: Secondary | ICD-10-CM | POA: Insufficient documentation

## 2017-12-18 DIAGNOSIS — R110 Nausea: Secondary | ICD-10-CM | POA: Diagnosis not present

## 2017-12-18 DIAGNOSIS — E039 Hypothyroidism, unspecified: Secondary | ICD-10-CM | POA: Diagnosis not present

## 2017-12-18 DIAGNOSIS — J45909 Unspecified asthma, uncomplicated: Secondary | ICD-10-CM | POA: Insufficient documentation

## 2017-12-18 DIAGNOSIS — I130 Hypertensive heart and chronic kidney disease with heart failure and stage 1 through stage 4 chronic kidney disease, or unspecified chronic kidney disease: Secondary | ICD-10-CM | POA: Insufficient documentation

## 2017-12-18 DIAGNOSIS — Z96651 Presence of right artificial knee joint: Secondary | ICD-10-CM | POA: Insufficient documentation

## 2017-12-18 DIAGNOSIS — J449 Chronic obstructive pulmonary disease, unspecified: Secondary | ICD-10-CM | POA: Diagnosis not present

## 2017-12-18 DIAGNOSIS — R531 Weakness: Secondary | ICD-10-CM

## 2017-12-18 HISTORY — DX: Heart failure, unspecified: I50.9

## 2017-12-18 HISTORY — DX: Age-related osteoporosis without current pathological fracture: M81.0

## 2017-12-18 HISTORY — DX: Chronic obstructive pulmonary disease, unspecified: J44.9

## 2017-12-18 LAB — CBC WITH DIFFERENTIAL/PLATELET
Basophils Absolute: 0 10*3/uL (ref 0.0–0.1)
Basophils Relative: 1 %
Eosinophils Absolute: 0.1 10*3/uL (ref 0.0–0.7)
Eosinophils Relative: 2 %
HCT: 31.8 % — ABNORMAL LOW (ref 36.0–46.0)
HEMOGLOBIN: 10 g/dL — AB (ref 12.0–15.0)
LYMPHS ABS: 1.3 10*3/uL (ref 0.7–4.0)
LYMPHS PCT: 21 %
MCH: 27.9 pg (ref 26.0–34.0)
MCHC: 31.4 g/dL (ref 30.0–36.0)
MCV: 88.8 fL (ref 78.0–100.0)
Monocytes Absolute: 0.7 10*3/uL (ref 0.1–1.0)
Monocytes Relative: 10 %
NEUTROS ABS: 4.2 10*3/uL (ref 1.7–7.7)
NEUTROS PCT: 66 %
Platelets: 188 10*3/uL (ref 150–400)
RBC: 3.58 MIL/uL — AB (ref 3.87–5.11)
RDW: 13.8 % (ref 11.5–15.5)
WBC: 6.3 10*3/uL (ref 4.0–10.5)

## 2017-12-18 LAB — COMPREHENSIVE METABOLIC PANEL
ALK PHOS: 45 U/L (ref 38–126)
ALT: 8 U/L — AB (ref 14–54)
AST: 17 U/L (ref 15–41)
Albumin: 3.3 g/dL — ABNORMAL LOW (ref 3.5–5.0)
Anion gap: 16 — ABNORMAL HIGH (ref 5–15)
BUN: 28 mg/dL — ABNORMAL HIGH (ref 6–20)
CALCIUM: 8.4 mg/dL — AB (ref 8.9–10.3)
CO2: 30 mmol/L (ref 22–32)
CREATININE: 1.98 mg/dL — AB (ref 0.44–1.00)
Chloride: 92 mmol/L — ABNORMAL LOW (ref 101–111)
GFR, EST AFRICAN AMERICAN: 25 mL/min — AB (ref >60)
GFR, EST NON AFRICAN AMERICAN: 21 mL/min — AB (ref >60)
Glucose, Bld: 83 mg/dL (ref 65–99)
Potassium: 3.3 mmol/L — ABNORMAL LOW (ref 3.5–5.1)
SODIUM: 138 mmol/L (ref 135–145)
Total Bilirubin: 1.2 mg/dL (ref 0.3–1.2)
Total Protein: 7 g/dL (ref 6.5–8.1)

## 2017-12-18 LAB — LIPASE, BLOOD: Lipase: 26 U/L (ref 11–51)

## 2017-12-18 LAB — TROPONIN I: Troponin I: 0.03 ng/mL (ref <0.03)

## 2017-12-18 LAB — I-STAT TROPONIN, ED: TROPONIN I, POC: 0.02 ng/mL (ref 0.00–0.08)

## 2017-12-18 MED ORDER — METOCLOPRAMIDE HCL 5 MG/ML IJ SOLN
10.0000 mg | Freq: Once | INTRAMUSCULAR | Status: AC
  Administered 2017-12-18: 10 mg via INTRAVENOUS
  Filled 2017-12-18: qty 2

## 2017-12-18 MED ORDER — SODIUM CHLORIDE 0.9 % IV BOLUS (SEPSIS)
1000.0000 mL | Freq: Once | INTRAVENOUS | Status: AC
  Administered 2017-12-18: 1000 mL via INTRAVENOUS

## 2017-12-18 MED ORDER — METOCLOPRAMIDE HCL 10 MG PO TABS: 10.0000 mg | ORAL_TABLET | Freq: Four times a day (QID) | ORAL | 0 refills | Status: AC

## 2017-12-18 MED ORDER — TRAMADOL HCL 50 MG PO TABS
50.0000 mg | ORAL_TABLET | Freq: Once | ORAL | Status: AC
  Administered 2017-12-18: 50 mg via ORAL
  Filled 2017-12-18: qty 1

## 2017-12-18 MED ORDER — FENTANYL CITRATE (PF) 100 MCG/2ML IJ SOLN
50.0000 ug | Freq: Once | INTRAMUSCULAR | Status: AC
  Administered 2017-12-18: 50 ug via INTRAVENOUS
  Filled 2017-12-18: qty 2

## 2017-12-18 MED ORDER — POTASSIUM CHLORIDE CRYS ER 20 MEQ PO TBCR
40.0000 meq | EXTENDED_RELEASE_TABLET | Freq: Once | ORAL | Status: AC
  Administered 2017-12-18: 40 meq via ORAL
  Filled 2017-12-18: qty 2

## 2017-12-18 NOTE — ED Provider Notes (Signed)
Emergency Department Provider Note   I have reviewed the triage vital signs and the nursing notes.   HISTORY  Chief Complaint Weakness   HPI Ana Pena is a 84 y.o. female with PMH of CAD, CKD, CHF, COPD, HTN, and HLD presents to the emergency department for evaluation of intractable nausea, decreased oral intake, and generalized weakness.  The patient is having difficulty with ADLs secondary to the weakness.  Abdominal pain worse in the right abdomen.  She had a bleed from all cyst that required interventional radiology intervention at [facility].  Since that time she has had the right sided pain but states it is worsening slightly and her nausea is intractable.  She does not drink any fluids or eat meals.  She does take her pills but otherwise is feeling poorly. Denies any fever/chills. Was treated for UTI recently. No dysuria, hesitancy, or urgency.    Past Medical History:  Diagnosis Date  . Abnormal CT scan    a. Question of possible renal cyst in RUQ by CT scan 06/2012 at [facility]. b. Redemonstrated 04/2013, pt instructed to f/u PCP.  [name] Arthritis    "all over" (05/11/2013)  . Asthma   . CAD (coronary artery disease)    a. s/p PTCA/DES to LAD 07/23/12. b. Mildly elevated trop 04/2013 with stable cath, no culprit.  . CHF (congestive heart failure) (HCC)   . Chronic lower back pain   . Chronic venous insufficiency   . CKD (chronic kidney disease)   . COPD (chronic obstructive pulmonary disease) (HCC)   . Diastolic CHF (HCC)    a. Prior normal EF 06/2013. b. EF 40-45% by echo 04/2013.  . Diverticulosis   . GERD (gastroesophageal reflux disease)   . H/O hiatal hernia   . Hypercholesteremia   . Hypertension   . Hypothyroidism   . Iron deficiency anemia   . LBBB (left bundle branch block)   . Osteoporosis     Patient Active Problem List   Diagnosis Date Noted  . CKD (chronic kidney disease) stage 3, GFR 30-59 ml/min (HCC) 05/08/2017  . Laceration of right knee 05/08/2017   . Anemia 05/08/2017  . AF (paroxysmal atrial fibrillation) (HCC) 05/08/2017  . UTI (urinary tract infection) 05/07/2017  . Syncope 05/07/2017  . AKI (acute kidney injury) (HCC)   . Cardiomyopathy, ischemic 05/13/2013  . HLD (hyperlipidemia) 09/10/2012  . CAD  s/p LAD stenting 8/13   . Diastolic CHF (HCC)   . Hypertension     Past Surgical History:  Procedure Laterality Date  . ABDOMINAL HYSTERECTOMY    . BREAST CYST EXCISION Right 1970's  . BUNIONECTOMY Bilateral   . CARDIAC CATHETERIZATION  05/11/2013  . CARPAL TUNNEL RELEASE Right   . CATARACT EXTRACTION W/ INTRAOCULAR LENS  IMPLANT, BILATERAL    . CHOLECYSTECTOMY    . CORONARY ANGIOPLASTY WITH STENT PLACEMENT  06/2012   DES proximal LAD [name] 05/11/2013  . CYSTOSCOPY KIDNEY W/ URETERAL GUIDE WIRE    . ESOPHAGOGASTRODUODENOSCOPY N/A 02/14/2016   Procedure: ESOPHAGOGASTRODUODENOSCOPY (EGD);  Surgeon: Malissa Hippo, MD;  Location: [facility];  Service: Endoscopy;  Laterality: N/A;  1:40  . HAMMER TOE SURGERY Bilateral   . LEFT HEART CATHETERIZATION WITH CORONARY ANGIOGRAM N/A 07/23/2012   Procedure: LEFT HEART CATHETERIZATION WITH CORONARY ANGIOGRAM;  Surgeon: Kathleene Hazel, MD;  Location: [facility] CATH LAB;  Service: Cardiovascular;  Laterality: N/A;  . LEFT HEART CATHETERIZATION WITH CORONARY ANGIOGRAM N/A 05/11/2013   Procedure: LEFT HEART CATHETERIZATION WITH  CORONARY ANGIOGRAM;  Surgeon: Kathleene Hazel, MD;  Location: [facility] CATH LAB;  Service: Cardiovascular;  Laterality: N/A;  . PERCUTANEOUS CORONARY STENT INTERVENTION (PCI-S)  07/23/2012   Procedure: PERCUTANEOUS CORONARY STENT INTERVENTION (PCI-S);  Surgeon: Kathleene Hazel, MD;  Location: [facility] CATH LAB;  Service: Cardiovascular;;  . TOE SURGERY     "don't really remember which toe or which side; it was real crooked and had to be straightened" (05/11/2013)  . TOTAL KNEE ARTHROPLASTY Right 2000    Current Outpatient Rx  . Order #: [id number] Class: Normal  .  Order #: [id number] Class: Historical Med  . Order #: [id number] Class: Historical Med  . Order #: [id number] Class: Historical Med  . Order #: [id number] Class: Historical Med  . Order #: [id number] Class: Normal  . Order #: [id number] Class: Historical Med  . Order #: [id number] Class: Historical Med  . Order #: [id number] Class: Print  . Order #: [id number] Class: Historical Med  . Order #: [id number] Class: Historical Med  . Order #: [id number] Class: Normal  . Order #: [id number] Class: Historical Med  . Order #: [id number] Class: Historical Med  . Order #: [id number] Class: Normal  . Order #: [id number] Class: Historical Med  . Order #: [id number] Class: Historical Med    Allergies Morphine and related; Sulfa antibiotics; and Sulfa drugs cross reactors  Family History  Problem Relation Age of Onset  . Breast cancer Mother   . Heart disease Father     Social History Social History   Tobacco Use  . Smoking status: Never Smoker  . Smokeless tobacco: Never Used  Substance Use Topics  . Alcohol use: No    Alcohol/week: 0.0 oz  . Drug use: No    Review of Systems  Constitutional: No fever/chills. Positive generalized weakness.  Eyes: No visual changes. ENT: No sore throat. Cardiovascular: Denies chest pain. Respiratory: Denies shortness of breath. Gastrointestinal: Positive right sided abdominal pain. Positive nausea, no vomiting.  No diarrhea.  No constipation. Genitourinary: Negative for dysuria. Musculoskeletal: Negative for back pain. Skin: Negative for rash. Neurological: Negative for headaches, focal weakness or numbness.  10-point ROS otherwise negative.  ____________________________________________   PHYSICAL EXAM:  VITAL SIGNS: ED Triage Vitals [12/18/17 1310]  Enc Vitals Group     BP (!) 146/68     Pulse Rate 69     Resp 15     Temp 98.4 F (36.9 C)     Temp Source Oral     SpO2 96 %     Weight 151 lb (68.5 kg)     Height 5' (1.524 m)   Constitutional: Alert and oriented.  Well appearing and in no acute distress. Eyes: Conjunctivae are normal. Head: Atraumatic. Nose: No congestion/rhinnorhea. Mouth/Throat: Mucous membranes are dry.  Neck: No stridor.  Cardiovascular: Normal rate, regular rhythm. Good peripheral circulation. Grossly normal heart sounds.   Respiratory: Normal respiratory effort.  No retractions. Lungs CTAB. Gastrointestinal: Soft with mild right sided abdominal tenderness to palpation. No rebound or guarding. No distention.  Musculoskeletal: No lower extremity tenderness nor edema. No gross deformities of extremities. Neurologic:  Normal speech and language. No gross focal neurologic deficits are appreciated.  Skin:  Skin is warm, dry and intact. No rash noted.  ____________________________________________   LABS (all labs ordered are listed, but only abnormal results are displayed)  Labs Reviewed  CBC WITH DIFFERENTIAL/PLATELET - Abnormal; Notable for the following components:      Result Value   RBC 3.58 (*)    Hemoglobin 10.0 (*)    HCT 31.8 (*)  All other components within normal limits  COMPREHENSIVE METABOLIC PANEL - Abnormal; Notable for the following components:   Potassium 3.3 (*)    Chloride 92 (*)    BUN 28 (*)    Creatinine, Ser 1.98 (*)    Calcium 8.4 (*)    Albumin 3.3 (*)    ALT 8 (*)    GFR calc non Af Amer 21 (*)    GFR calc Af Amer 25 (*)    Anion gap 16 (*)    All other components within normal limits  TROPONIN I - Abnormal; Notable for the following components:   Troponin I 0.03 (*)    All other components within normal limits  URINE CULTURE  LIPASE, BLOOD  I-STAT TROPONIN, ED   ____________________________________________  EKG   EKG Interpretation  Date/Time:  Thursday December 18 2017 13:20:34 EST Ventricular Rate:  69 PR Interval:  172 QRS Duration: 148 QT Interval:  494 QTC Calculation: 529 R Axis:   -66 Text Interpretation:  Sinus rhythm with Premature supraventricular complexes Left axis  deviation Left bundle branch block Abnormal ECG No STEMI  Confirmed by Alona Bene [phone]) on 12/18/2017 7:30:37 PM       ____________________________________________  RADIOLOGY  Ct Abdomen Pelvis Wo Contrast  Result Date: 12/18/2017 CLINICAL DATA:  Generalized weakness and abdominal distension. Reportedly, the patient has a history of a right upper quadrant renal cyst from 2013. EXAM: CT ABDOMEN AND PELVIS WITHOUT CONTRAST TECHNIQUE: Multidetector CT imaging of the abdomen and pelvis was performed following the standard protocol without IV contrast. COMPARISON:  None. FINDINGS: Lower chest: Volume loss at both lung bases, particularly on the right side. No significant pleural fluid. Coronary artery calcifications. Probable coronary artery stent. The descending thoracic aorta is heavily calcified. Hepatobiliary: Probable cholecystectomy. Limited evaluation of the liver. Pancreas: Normal appearance of the pancreas without inflammation or duct dilatation. Spleen: Normal appearance of spleen without enlargement. Adrenals/Urinary Tract: There is a massive low-density structure in the right upper abdomen which is displacing multiple structures in the right upper abdomen including the pancreas and liver. This structure measures 17.3 x 12.4 x 14.0 cm. This is a low-density structure but there are high density areas within this structure that are suggestive for hemorrhage. Structure appears to be associated with the right kidney. Metallic structures in the right kidney and near the lesion may represent embolization coils or surgical changes. Mild right perinephric edema. No hydronephrosis. Right adrenal gland is not confidently identified. Normal left adrenal gland. Normal appearance the left kidney without hydronephrosis or stones. Urinary bladder is unremarkable. Stomach/Bowel: Extensive colonic diverticulosis without acute inflammatory changes. Normal appearance of the small bowel without obstruction.  Vascular/Lymphatic: Abdominal aorta is heavily calcified without aneurysm. No significant lymph node enlargement in the abdomen or pelvis. Reproductive: Status post hysterectomy. No adnexal masses. Other: No free fluid.  No free air. Musculoskeletal: Old fractures of the right pubic rami. Scoliosis in the lumbar spine. Severe multilevel degenerative disease in the visualized thoracic and lumbar spine. IMPRESSION: 17 cm mass in the right upper abdomen that is likely originating from the right kidney. This could represent a very large renal cyst with internal hemorrhage. Mild inflammatory changes around the right kidney without hydronephrosis. This mass is causing a large amount of mass effect. Consider further characterization of this mass with ultrasound to exclude any internal vascular flow. Postsurgical changes involving the right kidney. Electronically Signed   By: Richarda Overlie M.D.   On: 12/18/2017 21:17   Dg Chest  2 View  Result Date: 12/18/2017 CLINICAL DATA:  Generalize weakness. Nausea and decreased p.o. intake. Loss of weight. EXAM: CHEST  2 VIEW COMPARISON:  05/08/2017 FINDINGS: Normal heart size. Asymmetric elevation of right hemidiaphragm noted. Stable tortuosity and calcification of the thoracic aorta. No pleural effusions or edema. No airspace opacities. IMPRESSION: 1. No acute cardiopulmonary abnormalities. 2.  Aortic Atherosclerosis (ICD10-I70.0). Electronically Signed   By: Signa Kellaylor  Stroud M.D.   On: 12/18/2017 13:52    ____________________________________________   PROCEDURES  Procedure(s) performed:   Procedures  None ____________________________________________   INITIAL IMPRESSION / ASSESSMENT AND PLAN / ED COURSE  Pertinent labs & imaging results that were available during my care of the patient were reviewed by me and considered in my medical decision making (see chart for details).  Patient presents emergency department with generalized same poor oral intake.  She has  some abdominal pain stemming [facility].  He needs to have some right-sided abdominal tenderness without rebound or guarding.  Vital signs are largely unremarkable.  Family report significant weight loss because patient is not eating anything.  She has tried Phenergan and Zofran with no relief.  Plan for IV fluids, Reglan, and reassess.   CTA read from 12/02/17 "no significant interval change sin size of the large hemorrhagic cyst arising from the upper pole of the right kidney measuring 10.8x16.9x12.4 cm"  Renal cyst is similar size today. Patient is not having increased abdominal pain since hemorrhagic cyst was first evaluated/treated. I don't see a clinical need to f/u with US at this time. Patient is feeling much better after Reglan and IVF. I offered observational admission for continued IVF and nausea meds but patient would like to return home and see the PCP tomorrow. Gave the patient PO fluids and crackers in the ED. She is eating this and feeling much improved nausea. Plan for discharge with return precautions discussed in detail.   I have reviewed and discussed all results (EKG, imaging, lab, urine as appropriate), exam findings with patient. I have reviewed nursing notes and appropriate previous records.  I feel the patient is safe to be discharged home without further emergent workup. Discussed usual and customary return precautions. Patient and family (if present) verbalize understanding and are comfortable with this plan.  Patient will follow-up with their primary care provider. If they do not have a primary care provider, information for follow-up has been provided to them. All questions have been answered.  ____________________________________________  FINAL CLINICAL IMPRESSION(S) / ED DIAGNOSES  Final diagnoses:  Generalized weakness  Nausea     MEDICATIONS GIVEN DURING THIS VISIT:  Medications  sodium chloride 0.9 % bolus 1,000 mL (0 mLs  Intravenous Stopped 12/18/17 2150)  metoCLOPramide (REGLAN) injection 10 mg (10 mg Intravenous Given 12/18/17 1941)  traMADol (ULTRAM) tablet 50 mg (50 mg Oral Given 12/18/17 1938)  potassium chloride SA (K-DUR,KLOR-CON) CR tablet 40 mEq (40 mEq Oral Given 12/18/17 1938)  fentaNYL (SUBLIMAZE) injection 50 mcg (50 mcg Intravenous Given 12/18/17 2148)     NEW OUTPATIENT MEDICATIONS STARTED DURING THIS VISIT:  Discharge Medication List as of 12/18/2017 11:29 PM    START taking these medications   Details  metoCLOPramide (REGLAN) 10 MG tablet Take 1 tablet (10 mg total) by mouth every 6 (six) hours., Starting Thu 12/18/2017, Print        Note:  This document was prepared using Dragon voice recognition software and may include unintentional dictation errors.  Alona BeneJoshua Arek Spadafore, MD Emergency Medicine  Maia Plan, MD 12/19/17 [phone]

## 2017-12-18 NOTE — Discharge Instructions (Signed)
You have been seen in the Emergency Department (ED) today for nausea and vomiting.  Your work up today has not shown a clear cause for your symptoms. °You have been prescribed Reglan; please use as prescribed as needed for your nausea. ° °Follow up with your doctor as soon as possible regarding today?s emergent visit and your symptoms of nausea.  ° °Return to the ED if you develop abdominal, bloody vomiting, bloody diarrhea, if you are unable to tolerate fluids due to vomiting, or if you develop other symptoms that concern you. ° °

## 2017-12-18 NOTE — ED Notes (Signed)
Pt given crackers and gingerale.

## 2017-12-18 NOTE — ED Notes (Signed)
Checked with pt for urine sample,pt tried no luck with try back in 30 minutes.

## 2017-12-18 NOTE — ED Triage Notes (Signed)
PT c/o generalized weakness, with nausea, decreased po intake, weight loss of 13.5lbs in the past week. PT states normal BM yesterday. [facility] by Dr. Sherril CroonVyas today for workup for possible dehydration. PT denies any pain and wears O2 @2L  per n/c continuously.

## 2017-12-19 NOTE — ED Notes (Signed)
Pt wheeled to waiting room by family. Pt verbalized understanding of discharge instructions.

## 2018-01-09 ENCOUNTER — Telehealth: Payer: Self-pay | Admitting: Cardiology

## 2018-01-09 NOTE — Telephone Encounter (Signed)
[facility]

## 2018-01-09 NOTE — Telephone Encounter (Signed)
Forward to provider for notification.  

## 2018-01-21 ENCOUNTER — Ambulatory Visit: Payer: Medicare Other | Admitting: Cardiology

## 2018-07-26 DEATH — deceased

## 2019-04-17 IMAGING — DX DG CHEST 2V
2 series · 2 of 2 positions shown · non-contrast
Comparison: 05/08/2017

CLINICAL DATA: Generalize weakness. Nausea and decreased p.o.
intake. Loss of weight.

EXAM:
CHEST  2 VIEW

[chest pa]
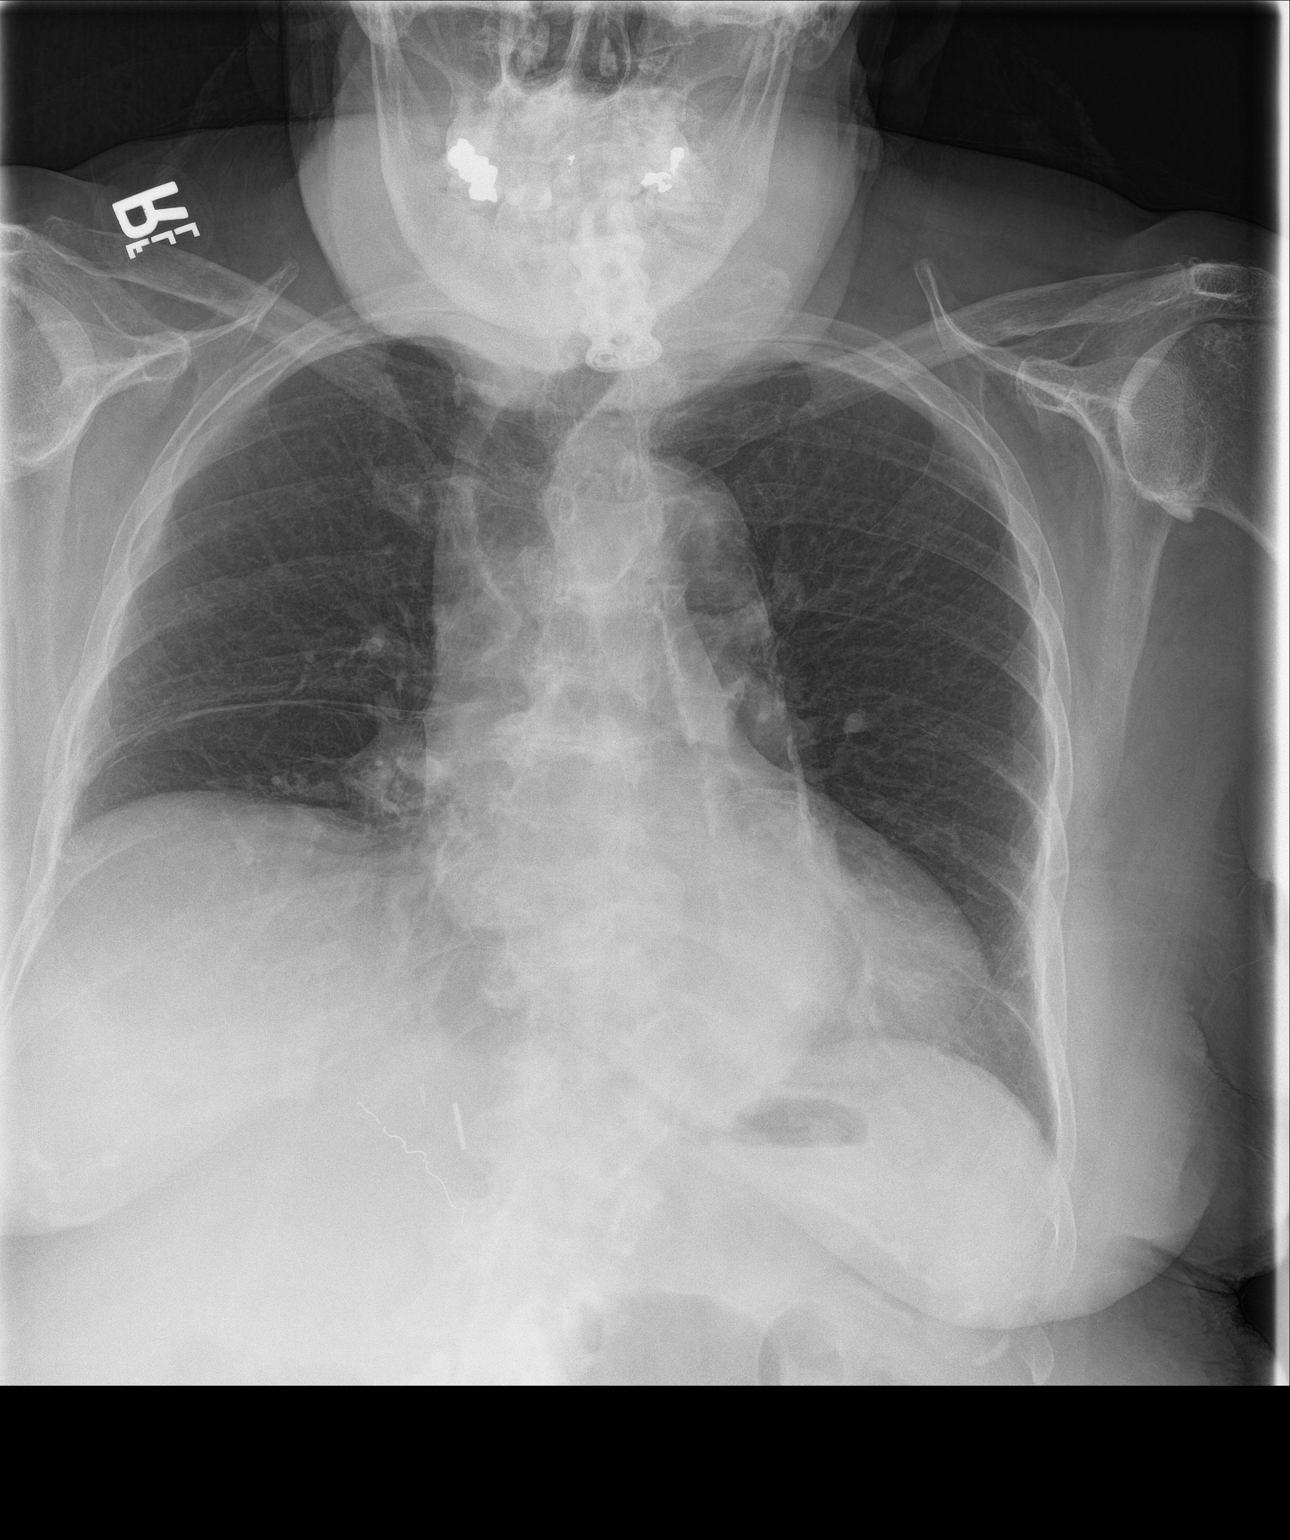

[chest lat]
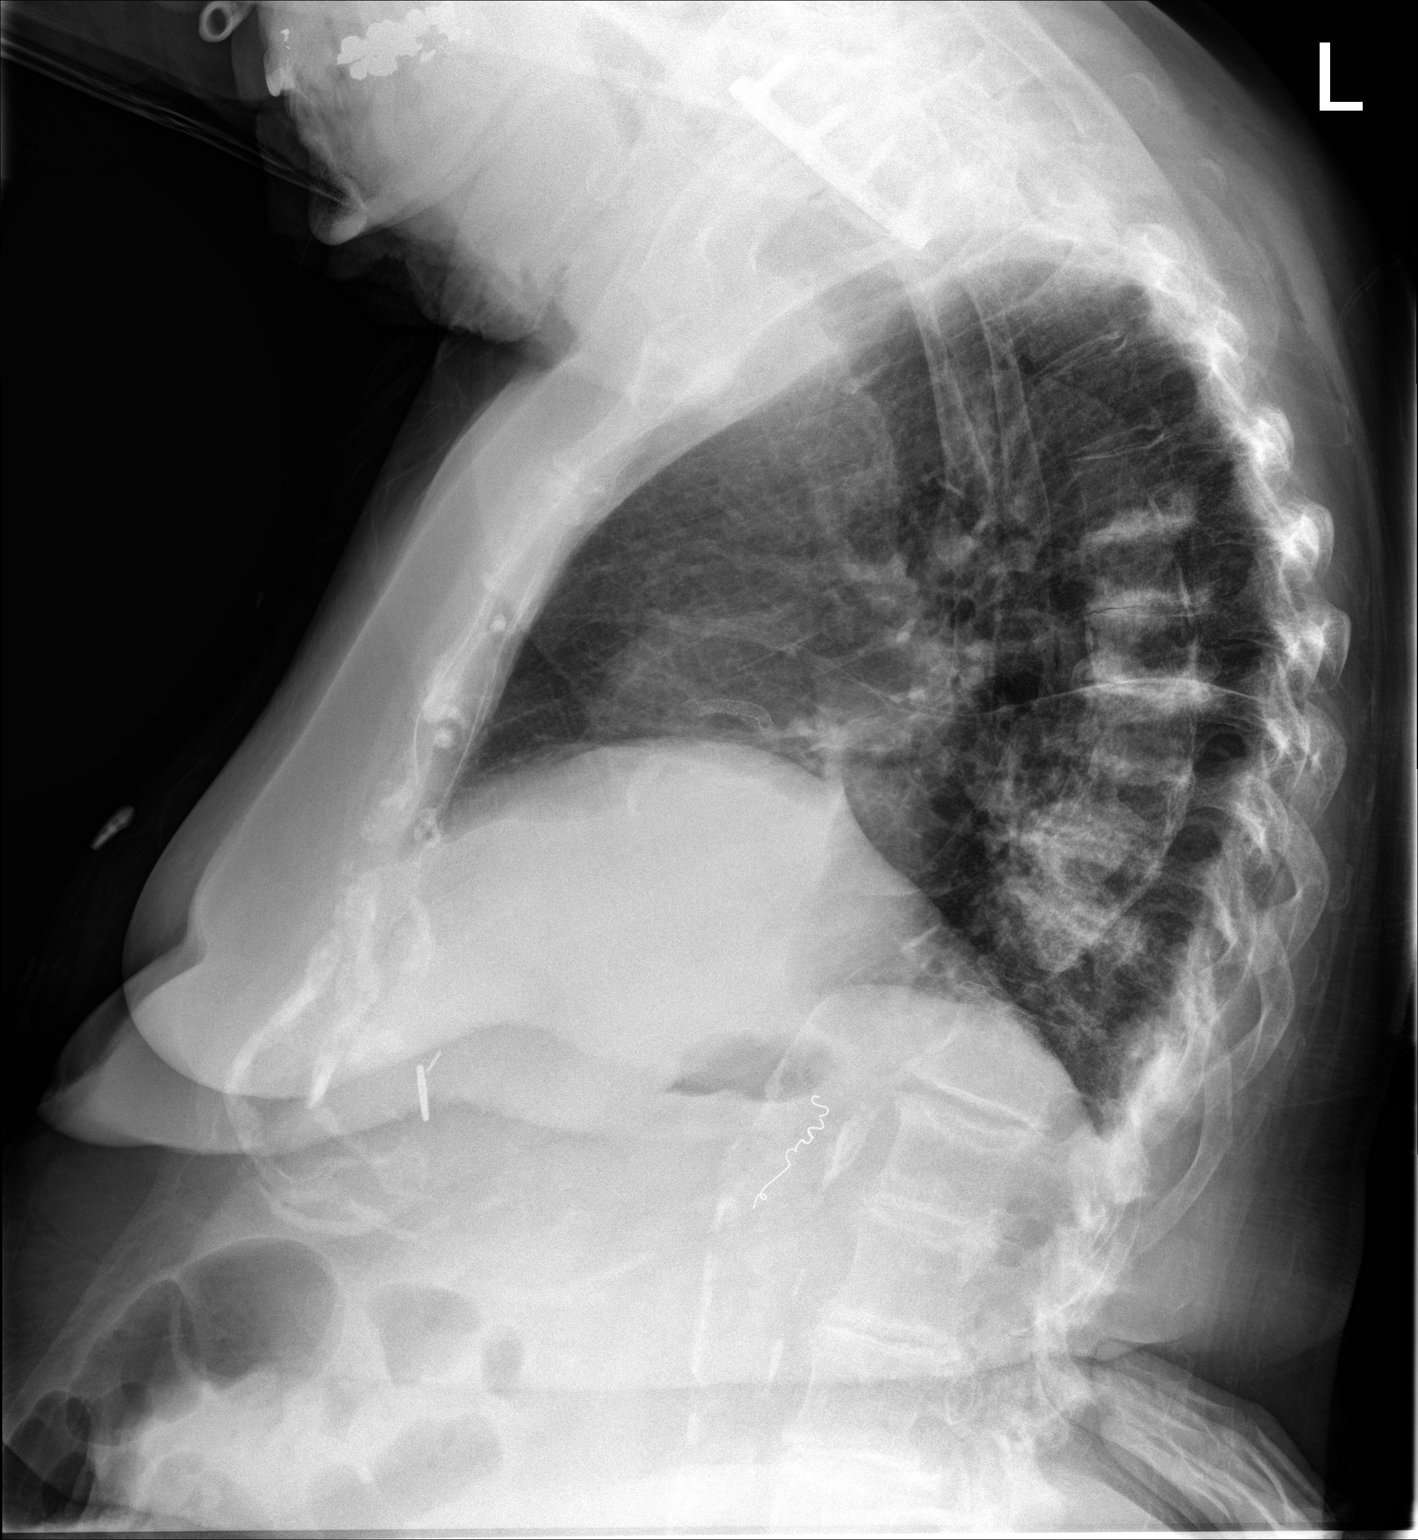

[2 of 2 positions shown; findings below may reference images not displayed]

FINDINGS: Normal heart size. Asymmetric elevation of right hemidiaphragm
noted. Stable tortuosity and calcification of the thoracic aorta. No
pleural effusions or edema. No airspace opacities.
IMPRESSION: 1. No acute cardiopulmonary abnormalities.
2.  Aortic Atherosclerosis ([id number]).

## 2019-04-17 IMAGING — CT CT ABD-PELV W/O CM
2 of 4 series · 16 of 46 positions shown, 18 images · non-contrast
Comparison: None.

CLINICAL DATA: Generalized weakness and abdominal distension.
Reportedly, the patient has a history of a right upper quadrant
renal cyst from 4542.

EXAM:
CT ABDOMEN AND PELVIS WITHOUT CONTRAST
TECHNIQUE: Multidetector CT imaging of the abdomen and pelvis was performed
following the standard protocol without IV contrast.

[Series 2: axial st · axial · 0.76mm/px · z∈[+930,+1310]mm · 13 of 84 slices shown, 15 images]
[im 4/84  soft-tissue]
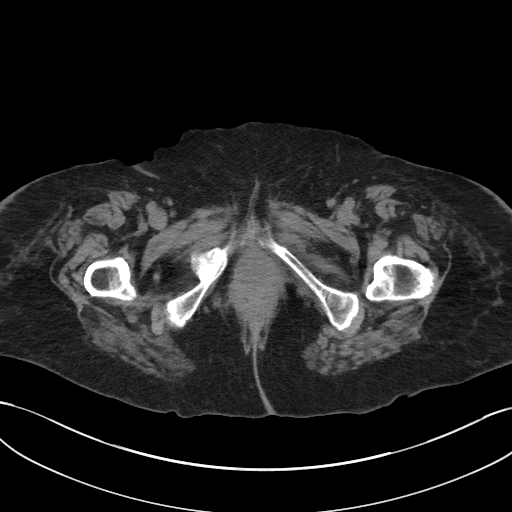
[im 4/84  bone]
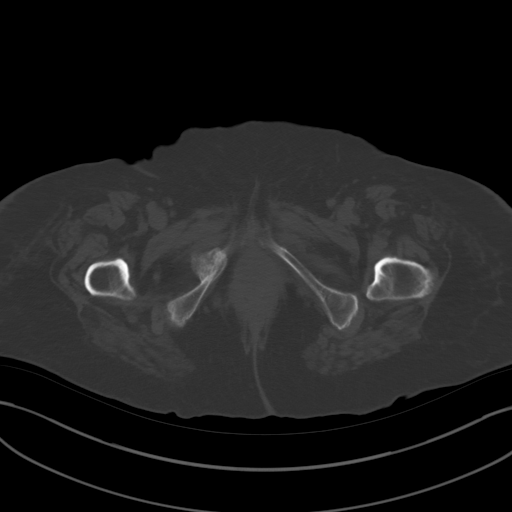
[im 12/84  soft-tissue]
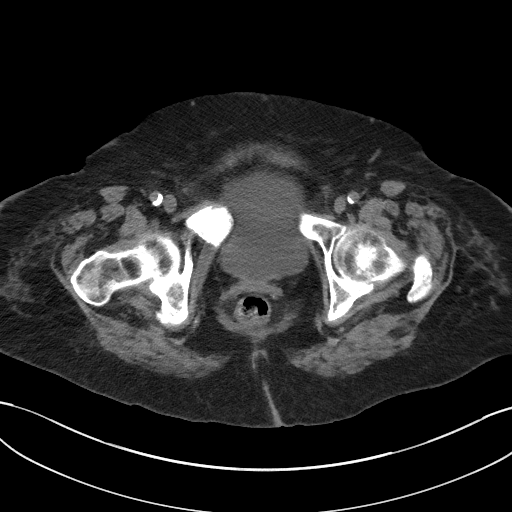
[im 19/84  soft-tissue]
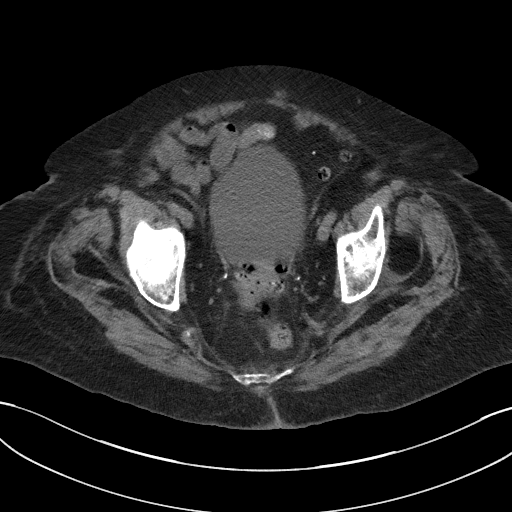
[im 23/84  soft-tissue]
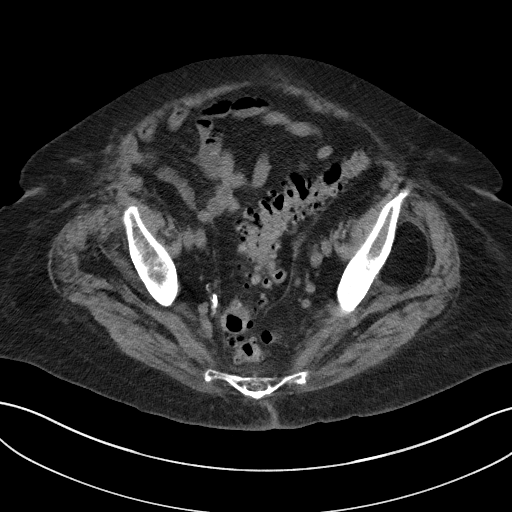
[im 31/84  soft-tissue]
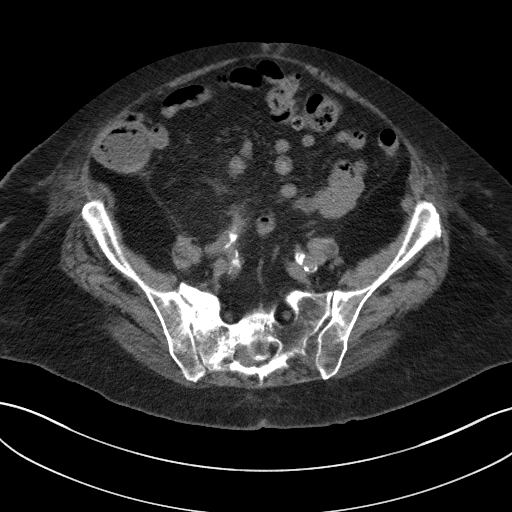
[im 34/84  soft-tissue]
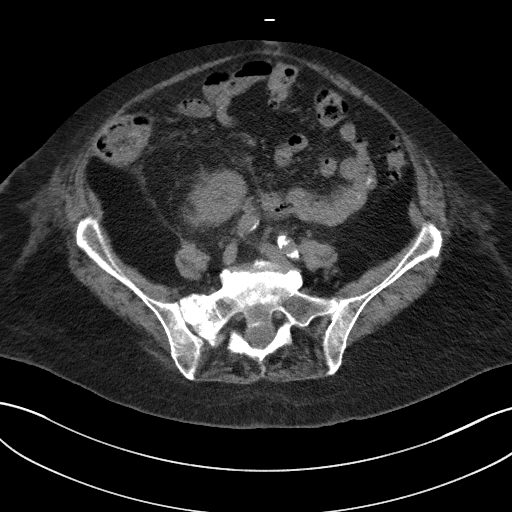
[im 42/84  soft-tissue]
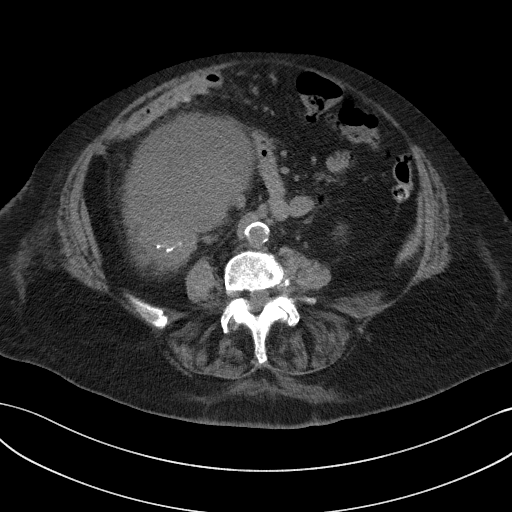
[im 50/84  soft-tissue]
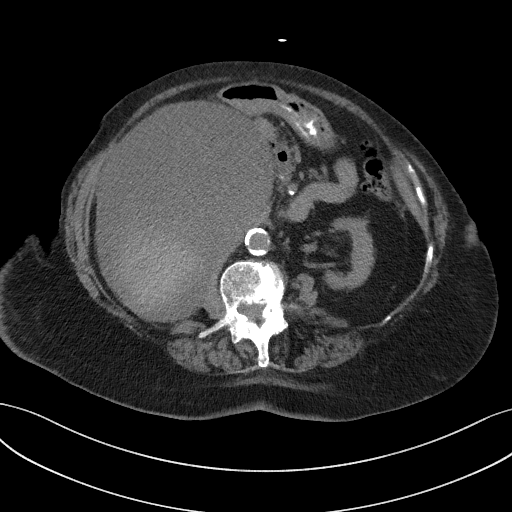
[im 53/84  soft-tissue]
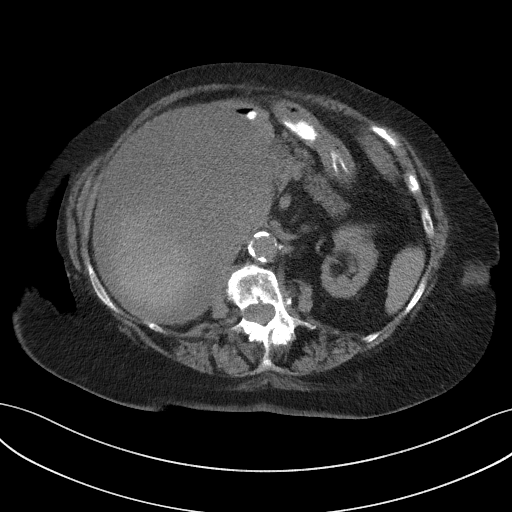
[im 53/84  bone]
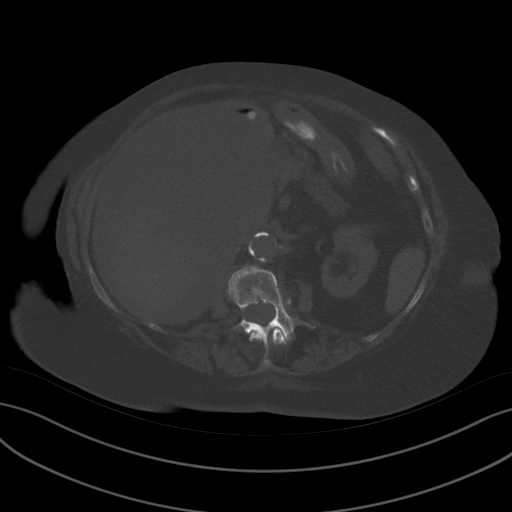
[im 61/84  soft-tissue]
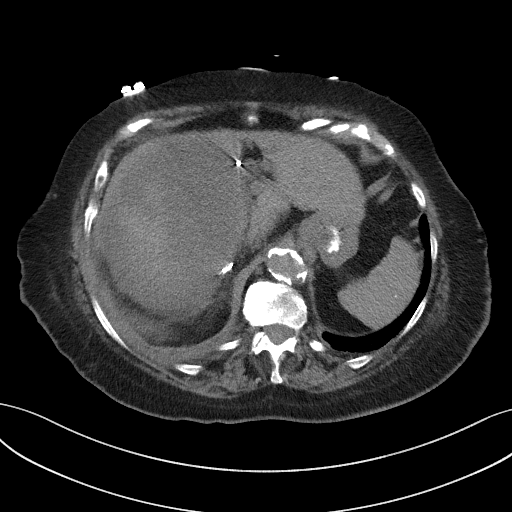
[im 65/84  soft-tissue]
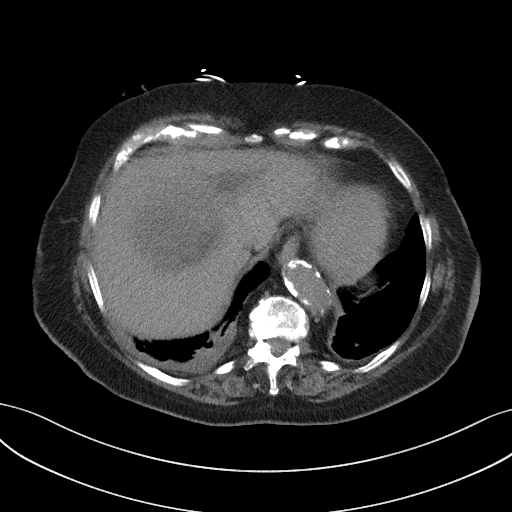
[im 72/84  soft-tissue]
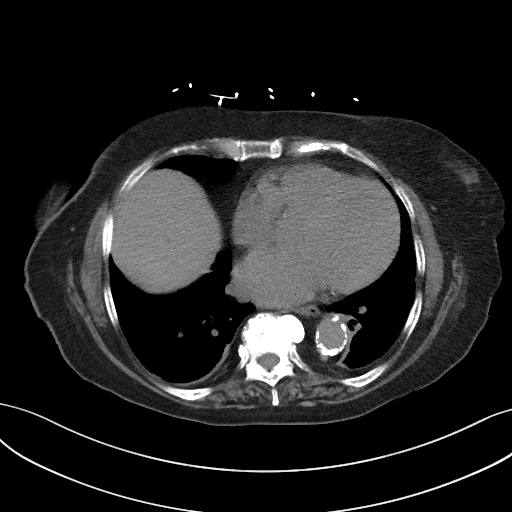
[im 80/84  soft-tissue]
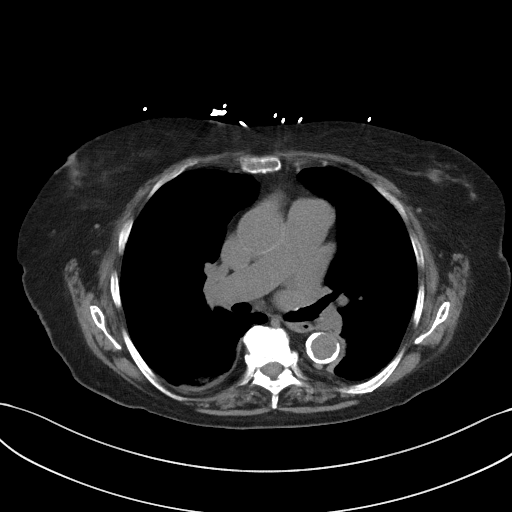

[Series 5: coronal st · coronal · 0.81mm/px · 3 of 111 slices shown]
[im 37/111  soft-tissue]
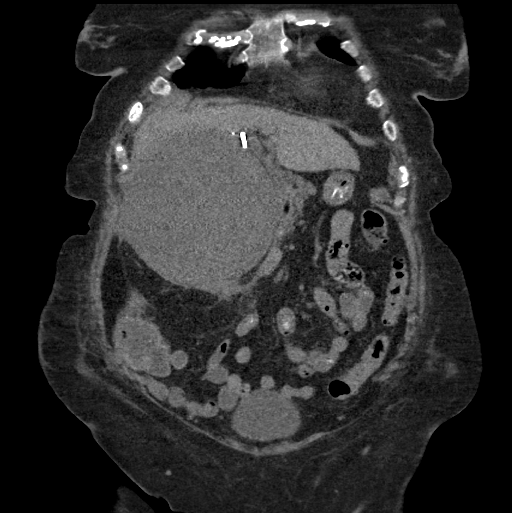
[im 49/111  soft-tissue]
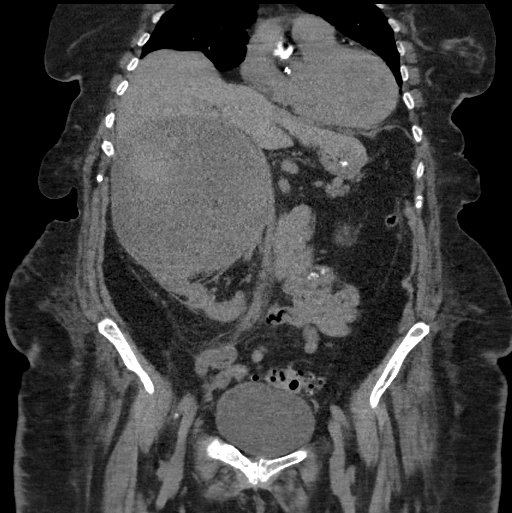
[im 62/111  soft-tissue]
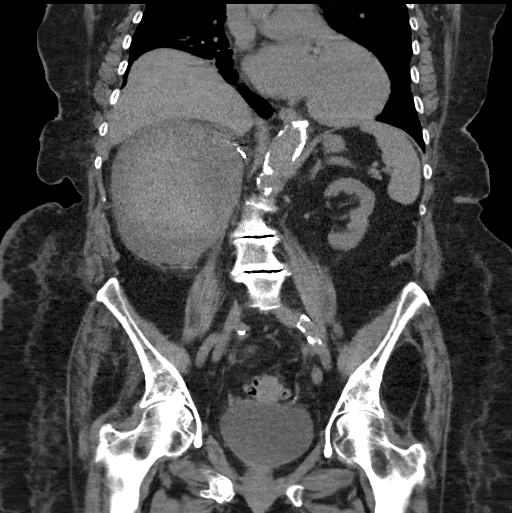

[16 of 46 positions shown; findings below may reference images not displayed]

FINDINGS: Lower chest: Volume loss at both lung bases, particularly on the
right side. No significant pleural fluid. Coronary artery
calcifications. Probable coronary artery stent. The descending
thoracic aorta is heavily calcified.

Hepatobiliary: Probable cholecystectomy. Limited evaluation of the
liver.

Pancreas: Normal appearance of the pancreas without inflammation or
duct dilatation.

Spleen: Normal appearance of spleen without enlargement.

Adrenals/Urinary Tract: There is a massive low-density structure in
the right upper abdomen which is displacing multiple structures in
the right upper abdomen including the pancreas and liver. This
structure measures 17.3 x 12.4 x 14.0 cm. This is a low-density
structure but there are high density areas within this structure
that are suggestive for hemorrhage. Structure appears to be
associated with the right kidney. Metallic structures in the right
kidney and near the lesion may represent embolization coils or
surgical changes. Mild right perinephric edema. No hydronephrosis.
Right adrenal gland is not confidently identified. Normal left
adrenal gland. Normal appearance the left kidney without
hydronephrosis or stones. Urinary bladder is unremarkable.

Stomach/Bowel: Extensive colonic diverticulosis without acute
inflammatory changes. Normal appearance of the small bowel without
obstruction.

Vascular/Lymphatic: Abdominal aorta is heavily calcified without
aneurysm. No significant lymph node enlargement in the abdomen or
pelvis.

Reproductive: Status post hysterectomy. No adnexal masses.

Other: No free fluid.  No free air.

Musculoskeletal: Old fractures of the right pubic rami. Scoliosis in
the lumbar spine. Severe multilevel degenerative disease in the
visualized thoracic and lumbar spine.
IMPRESSION: 17 cm mass in the right upper abdomen that is likely originating
from the right kidney. This could represent a very large renal cyst
with internal hemorrhage. Mild inflammatory changes around the right
kidney without hydronephrosis. This mass is causing a large amount
of mass effect. Consider further characterization of this mass with
ultrasound to exclude any internal vascular flow.

Postsurgical changes involving the right kidney.
